# Patient Record
Sex: Female | Born: 1965
Health system: Southern US, Community
[De-identification: ages and names within clinical notes are randomized; demographics above are authoritative.]

## PROBLEM LIST (undated history)

## (undated) DIAGNOSIS — G2581 Restless legs syndrome: Secondary | ICD-10-CM

## (undated) DIAGNOSIS — E119 Type 2 diabetes mellitus without complications: Secondary | ICD-10-CM

## (undated) DIAGNOSIS — I10 Essential (primary) hypertension: Secondary | ICD-10-CM

## (undated) DIAGNOSIS — E785 Hyperlipidemia, unspecified: Secondary | ICD-10-CM

## (undated) DIAGNOSIS — G35D Multiple sclerosis, unspecified: Secondary | ICD-10-CM

## (undated) DIAGNOSIS — D649 Anemia, unspecified: Secondary | ICD-10-CM

## (undated) DIAGNOSIS — G35 Multiple sclerosis: Secondary | ICD-10-CM

## (undated) HISTORY — PX: BONE CYST EXCISION: SHX376

## (undated) HISTORY — PX: ESSURE TUBAL LIGATION: SUR464

## (undated) HISTORY — DX: Restless legs syndrome: G25.81

---
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
## 1999-03-11 ENCOUNTER — Other Ambulatory Visit: Admission: RE | Admit: 1999-03-11 | Discharge: 1999-03-11 | Payer: Self-pay | Admitting: *Deleted

## 2000-08-05 ENCOUNTER — Other Ambulatory Visit: Admission: RE | Admit: 2000-08-05 | Discharge: 2000-08-05 | Payer: Self-pay | Admitting: *Deleted

## 2001-09-03 ENCOUNTER — Emergency Department (HOSPITAL_COMMUNITY): Admission: EM | Admit: 2001-09-03 | Discharge: 2001-09-03 | Payer: Self-pay | Admitting: Emergency Medicine

## 2001-09-03 ENCOUNTER — Encounter: Payer: Self-pay | Admitting: Emergency Medicine

## 2001-09-05 ENCOUNTER — Other Ambulatory Visit: Admission: RE | Admit: 2001-09-05 | Discharge: 2001-09-05 | Payer: Self-pay | Admitting: *Deleted

## 2002-09-11 ENCOUNTER — Other Ambulatory Visit: Admission: RE | Admit: 2002-09-11 | Discharge: 2002-09-11 | Payer: Self-pay | Admitting: *Deleted

## 2003-11-06 ENCOUNTER — Other Ambulatory Visit: Admission: RE | Admit: 2003-11-06 | Discharge: 2003-11-06 | Payer: Self-pay | Admitting: *Deleted

## 2004-11-17 ENCOUNTER — Other Ambulatory Visit: Admission: RE | Admit: 2004-11-17 | Discharge: 2004-11-17 | Payer: Self-pay | Admitting: Obstetrics and Gynecology

## 2005-03-10 ENCOUNTER — Ambulatory Visit (HOSPITAL_COMMUNITY): Admission: RE | Admit: 2005-03-10 | Discharge: 2005-03-10 | Payer: Self-pay | Admitting: Internal Medicine

## 2005-03-16 ENCOUNTER — Encounter: Admission: RE | Admit: 2005-03-16 | Discharge: 2005-03-16 | Payer: Self-pay | Admitting: Internal Medicine

## 2005-03-23 ENCOUNTER — Ambulatory Visit (HOSPITAL_COMMUNITY): Admission: RE | Admit: 2005-03-23 | Discharge: 2005-03-24 | Payer: Self-pay | Admitting: Neurology

## 2005-03-23 ENCOUNTER — Encounter (INDEPENDENT_AMBULATORY_CARE_PROVIDER_SITE_OTHER): Payer: Self-pay | Admitting: *Deleted

## 2006-04-01 ENCOUNTER — Other Ambulatory Visit: Admission: RE | Admit: 2006-04-01 | Discharge: 2006-04-01 | Payer: Self-pay | Admitting: Obstetrics and Gynecology

## 2006-06-26 ENCOUNTER — Emergency Department (HOSPITAL_COMMUNITY): Admission: EM | Admit: 2006-06-26 | Discharge: 2006-06-27 | Payer: Self-pay | Admitting: Emergency Medicine

## 2008-10-15 DIAGNOSIS — E119 Type 2 diabetes mellitus without complications: Secondary | ICD-10-CM | POA: Insufficient documentation

## 2009-07-31 ENCOUNTER — Ambulatory Visit (HOSPITAL_COMMUNITY): Admission: RE | Admit: 2009-07-31 | Discharge: 2009-07-31 | Payer: Self-pay | Admitting: Obstetrics and Gynecology

## 2011-04-24 NOTE — Op Note (Signed)
NAMEKALLAN, BISCHOFF              ACCOUNT NO.:  [id number]   MEDICAL RECORD NO.:  [id number]          PATIENT TYPE:  OUT   LOCATION:  [facility]                          FACILITY:  [facility]   PHYSICIAN:  Melvyn Novas, M.D.  DATE OF BIRTH:  [birth year]   DATE OF PROCEDURE:  DATE OF DISCHARGE:                                 OPERATIVE REPORT   SPINAL FLUID EVALUATION/LUMBAR PUNCTURE:  Mrs. Fargo is a 45 year old Caucasian right-handed female patient of Dr.  Kirby Funk who recently developed sudden onset of hemilateral numbness  from the thoracic T6, T7 level downwards involving half of the torso and  lower extremity.  An MRI was already obtained through Dr. Valentina Lucks and showed  a likely demyelination lesion in the spinal cord.  The CSF was obtained  today in an uncomplicated procedure.  The patient was placed at the side of  the bed, bending forwards.  The area was sterilely prepped and covered.  A  regular spinal tap needle 13 gauge was inserted without any problem after  lidocaine anesthesia.  The patient tolerated the procedure well and was very  cooperative.  Four vial of 2.5 mL each were obtained from the spinal tap.  The needle was then retracted without complications and without significant  bleeding.  The patient was advised to lie down for 90 minutes in horizontal  position and to drink fluids.  She received a Percocet p.r.n. prescription  in case she developed spinal headache.  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  Laboratory results please forward to Dr.  Kirby Funk and to [facility] automatically upon  return.      CD/MEDQ  D:  03/23/2005  T:  03/23/2005  Job:  [id number]   cc:   Thora Lance, M.D.  [address]  [place]  [place] [address]  Fax: [phone]

## 2011-10-16 ENCOUNTER — Ambulatory Visit (INDEPENDENT_AMBULATORY_CARE_PROVIDER_SITE_OTHER): Payer: Self-pay | Admitting: Pharmacist

## 2011-10-16 ENCOUNTER — Encounter: Payer: Self-pay | Admitting: Pharmacist

## 2011-10-16 DIAGNOSIS — R519 Headache, unspecified: Secondary | ICD-10-CM | POA: Insufficient documentation

## 2011-10-16 DIAGNOSIS — G35 Multiple sclerosis: Secondary | ICD-10-CM

## 2011-10-16 DIAGNOSIS — I10 Essential (primary) hypertension: Secondary | ICD-10-CM

## 2011-10-16 DIAGNOSIS — E7800 Pure hypercholesterolemia, unspecified: Secondary | ICD-10-CM

## 2011-10-16 DIAGNOSIS — E119 Type 2 diabetes mellitus without complications: Secondary | ICD-10-CM

## 2011-10-16 DIAGNOSIS — R51 Headache: Secondary | ICD-10-CM

## 2011-10-16 NOTE — Assessment & Plan Note (Signed)
No complications of current therapy with no plans to change current therapy.

## 2011-10-16 NOTE — Progress Notes (Signed)
  Subjective:    Patient ID: Erica Powell, female    DOB: 1966-10-02, 45 y.o.   MRN: [id number]  HPI Pt arrived in good spirits for a medication review with a medication list. Pt has a diagnosis for multiple sclerosis for which she takes Betaseron. Pt states that she recently started a multivitamin with iron for low iron counts.    Review of Systems     Objective:   Physical Exam BP 137/94  Pulse 81  Ht 5' 4.7" (1.643 m)  Wt 209 lb 3.2 oz (94.892 kg)  BMI 35.14 kg/m2        Assessment & Plan:  No complications of current therapy with no plans to change current therapy.   Patient seen with Marijo Conception, PharmD Candidate and Maudry Mayhew, Pharmacy Resident.

## 2011-10-16 NOTE — Progress Notes (Signed)
  Subjective:    Patient ID: Erica Powell, female    DOB: 09-Aug-1966, 45 y.o.   MRN: [id number]  HPI  Reviewed and agree with Dr. Macky Lower management.  Review of Systems     Objective:   Physical Exam        Assessment & Plan:

## 2011-11-15 ENCOUNTER — Emergency Department
Admission: EM | Admit: 2011-11-15 | Discharge: 2011-11-15 | Disposition: A | Discharge status: Home / Self Care | Payer: 59 | Source: Home / Self Care | Attending: Family Medicine | Admitting: Family Medicine

## 2011-11-15 ENCOUNTER — Encounter: Payer: Self-pay | Admitting: Emergency Medicine

## 2011-11-15 DIAGNOSIS — R112 Nausea with vomiting, unspecified: Secondary | ICD-10-CM

## 2011-11-15 DIAGNOSIS — H109 Unspecified conjunctivitis: Secondary | ICD-10-CM

## 2011-11-15 HISTORY — DX: Essential (primary) hypertension: I10

## 2011-11-15 HISTORY — DX: Hyperlipidemia, unspecified: E78.5

## 2011-11-15 HISTORY — DX: Multiple sclerosis, unspecified: G35.D

## 2011-11-15 HISTORY — DX: Multiple sclerosis: G35

## 2011-11-15 HISTORY — DX: Type 2 diabetes mellitus without complications: E11.9

## 2011-11-15 MED ORDER — TOBRAMYCIN-DEXAMETHASONE 0.3-0.1 % OP SUSP: OPHTHALMIC | Status: AC

## 2011-11-15 NOTE — ED Notes (Signed)
Left eye conjunctivitis; no known injury.

## 2011-11-16 NOTE — ED Provider Notes (Addendum)
History     CSN: [id number] Arrival date & time: 11/15/2011  3:04 PM   First MD Initiated Contact with Patient 11/15/11 1545      Chief Complaint  Patient presents with  . Conjunctivitis      HPI Comments: Patient complains of onset of left eye itching and redness yesterday.  She denies foreign body sensation.  She wears contacts, and has not been wearing them since onset of symptoms.  Yesterday she also awoke with nausea/vomiting and diarrhea that has now ceased.  She now feels well otherwise.  Patient is a 45 y.o. female presenting with conjunctivitis. The history is provided by the patient.  Conjunctivitis  The current episode started yesterday. The problem occurs frequently. The problem has been unchanged. The problem is mild. The symptoms are relieved by nothing. The symptoms are aggravated by nothing. Associated symptoms include eye itching, eye discharge and eye redness. Pertinent negatives include no fever, no decreased vision, no double vision, no photophobia, no congestion, no ear discharge, no ear pain, no headaches, no rhinorrhea, no sore throat and no eye pain.    Past Medical History  Diagnosis Date  . Multiple sclerosis   . Diabetes mellitus   . Type 2 diabetes mellitus without (mention of) complications   . Hyperlipemia   . Hypertension     Past Surgical History  Procedure Date  . Essure tubal ligation   . Bone cyst excision     No family history on file.  History  Substance Use Topics  . Smoking status: Never Smoker   . Smokeless tobacco: Never Used  . Alcohol Use: No    OB History    Grav Para Term Preterm Abortions TAB SAB Ect Mult Living                  Review of Systems  Constitutional: Negative for fever.  HENT: Negative for ear pain, congestion, sore throat, rhinorrhea and ear discharge.   Eyes: Positive for discharge, redness and itching. Negative for double vision, photophobia and pain.  Neurological: Negative for headaches.  All other  systems reviewed and are negative.    Allergies  Cephalosporins; Hydrochlorothiazide; Penicillins; Sulfa antibiotics; and Amoxicillin  Home Medications   Current Outpatient Rx  Name Route Sig Dispense Refill  . ACETAMINOPHEN 500 MG PO TABS Oral Take 2 tablets (1,000 mg total) by mouth every other day. Prior to Betaseron injection.    . ASPIRIN 81 MG PO TABS Oral Take 1 tablet (81 mg total) by mouth daily.    . ASPIRIN-ACETAMINOPHEN-CAFFEINE 250-250-65 MG PO TABS Oral Take 2 tablets by mouth as needed (Headache).    . ATORVASTATIN CALCIUM 10 MG PO TABS Oral Take 1 tablet (10 mg total) by mouth daily.    [name] CLONAZEPAM 0.5 MG PO TABS Oral Take 0.5 tablets (0.25 mg total) by mouth 2 (two) times daily as needed. For insomnia and myoclonus jerks.    . INTERFERON BETA-1B 0.3 MG [place] SOLR Subcutaneous Inject 0.3 mg into the skin every other day.    [name] LISINOPRIL 10 MG PO TABS Oral Take 1 tablet (10 mg total) by mouth daily.    [name] METFORMIN HCL 1000 MG PO TABS Oral Take 1 tablet (1,000 mg total) by mouth 2 (two) times daily with a meal.    . MULTIVITAMIN/IRON PO TABS Oral Take 1 tablet by mouth daily.  0  . SERTRALINE HCL 50 MG PO TABS Oral Take 1 tablet (50 mg total) by mouth daily.    [name]  TOBRAMYCIN-DEXAMETHASONE 0.3-0.1 % OP SUSP  One or two drops in left eye every 4hours while awake. 5 mL 0    Pulse 78  Temp(Src) 98.7 F (37.1 C) (Oral)  Resp 16  Ht 5' 5.5" (1.664 m)  Wt 204 lb (92.534 kg)  BMI 33.43 kg/m2  SpO2 98%  LMP 10/27/2011  Physical Exam  Nursing note and vitals reviewed. Constitutional: She is oriented to person, place, and time. She appears well-developed and well-nourished. No distress.  HENT:  Head: Normocephalic.  Right Ear: External ear normal.  Left Ear: External ear normal.  Nose: Nose normal.  Mouth/Throat: Oropharynx is clear and moist.  Eyes: EOM and lids are normal. Pupils are equal, round, and reactive to light. No foreign bodies found. Right eye exhibits no  discharge. Left eye exhibits no discharge, no exudate and no hordeolum. No foreign body present in the left eye. Right conjunctiva is not injected. Left conjunctiva is injected. Left conjunctiva has no hemorrhage.       Left conjuctivae mildly injected.  Lid eversion reveals no foreign body.  Fluorescein reveals no uptake.  Fundi appear benign.  No photophobia.  Neck: Neck supple.  Cardiovascular: Normal heart sounds.   Pulmonary/Chest: Effort normal and breath sounds normal. No respiratory distress.  Abdominal: Soft. There is no tenderness.  Musculoskeletal: She exhibits no edema.  Lymphadenopathy:    She has no cervical adenopathy.  Neurological: She is alert and oriented to person, place, and time.  Skin: Skin is warm and dry. No rash noted.    ED Course  Procedures none      1. Conjunctivitis of left eye       MDM  Patient may have early viral syndrome because of onset of nausea/vomiting yesterday. However, will treat as a bacterial conjunctivitis:  Begin Tobradex ophthalmic suspension q4hr. Followup with ophthalmologist if not improving 3 to 4 days.        Donna Christen, MD 11/17/11 1478  Donna Christen, MD 11/17/11 [phone]

## 2012-10-03 ENCOUNTER — Ambulatory Visit (INDEPENDENT_AMBULATORY_CARE_PROVIDER_SITE_OTHER): Payer: Self-pay | Admitting: Pharmacist

## 2012-10-03 ENCOUNTER — Encounter: Payer: Self-pay | Admitting: Pharmacist

## 2012-10-03 VITALS — BP 165/89 | HR 69 | Ht 66.0 in | Wt 212.8 lb

## 2012-10-03 DIAGNOSIS — G35 Multiple sclerosis: Secondary | ICD-10-CM

## 2012-10-03 DIAGNOSIS — G35D Multiple sclerosis, unspecified: Secondary | ICD-10-CM

## 2012-10-03 NOTE — Progress Notes (Signed)
  Subjective:    Patient ID: Erica Powell, female    DOB: [birth year], 46 y.o.   MRN: [id number]  HPI  Patient arrives in good spirits for medication review.   Reports seeing Dr. Kirby Funk - Deboraha Sprang at [facility] as primary care provider, Dr. Emilia Beck  as neurologist, and Dr. Diana Eves for Opthamology.  Reports being diagnosed with MS since 2006 and states this is currently under an acceptable level of control.     Review of Systems     Objective:   Physical Exam        Assessment & Plan:  Following medication review, no suggestions for change.  Complete medication list provided to patient.  Total time in face to face medication review: 15 minutes.  Patient seen with: Tiney Rouge, PharmD Candidate.

## 2012-10-03 NOTE — Progress Notes (Signed)
Patient ID: Erica Powell, female   DOB: 1966/01/10, 46 y.o.   MRN: [id number] Reviewed and agree with Dr. Macky Lower documentation and management.

## 2012-10-03 NOTE — Patient Instructions (Addendum)
Thank you for coming in today for your medication review. I am happy to hear that you are doing well and please give a me call if you have any questions about your medications. Have a great day.

## 2012-10-03 NOTE — Assessment & Plan Note (Signed)
Following medication review, no suggestions for change.  Complete medication list provided to patient.  Total time in face to face medication review: 15 minutes.  Patient seen with: Tiney Rouge, PharmD Candidate.

## 2013-07-05 ENCOUNTER — Other Ambulatory Visit: Payer: Self-pay | Admitting: Neurology

## 2013-07-06 ENCOUNTER — Other Ambulatory Visit: Payer: Self-pay

## 2013-07-06 DIAGNOSIS — G35 Multiple sclerosis: Secondary | ICD-10-CM

## 2013-07-06 MED ORDER — CLONAZEPAM 0.5 MG PO TABS: 0.2500 mg | ORAL_TABLET | Freq: Two times a day (BID) | ORAL | Status: DC | PRN

## 2013-09-05 ENCOUNTER — Telehealth: Payer: Self-pay | Admitting: Neurology

## 2013-09-05 NOTE — Telephone Encounter (Signed)
Called patient and asked her to hydrate and take electrolytes, Mg , ca  K.  If no change by tomorrow will need a  Visit with PCP - .[name] The tingling involves face and all extremities , lips,   Body-front and back- unlikely a CNS manifestation form MS .

## 2013-09-05 NOTE — Telephone Encounter (Signed)
Spoke to patient and she has been having prickly and tingling feeling on different parts of her body for over a week.  These feeling come and go, she doesn't think its her MS.   Wants to know if she should come in or if there is something she can take.  [phone]  Please advise.

## 2013-10-16 ENCOUNTER — Other Ambulatory Visit: Payer: Self-pay | Admitting: Neurology

## 2013-10-16 ENCOUNTER — Telehealth: Payer: Self-pay | Admitting: Neurology

## 2013-10-16 DIAGNOSIS — G35 Multiple sclerosis: Secondary | ICD-10-CM

## 2013-10-16 MED ORDER — INTERFERON BETA-1B 0.3 MG ~~LOC~~ KIT: 0.3000 mg | PACK | SUBCUTANEOUS | Status: DC

## 2013-11-07 ENCOUNTER — Encounter: Payer: Self-pay | Admitting: Neurology

## 2013-11-07 ENCOUNTER — Encounter (INDEPENDENT_AMBULATORY_CARE_PROVIDER_SITE_OTHER): Payer: Self-pay

## 2013-11-07 ENCOUNTER — Ambulatory Visit (INDEPENDENT_AMBULATORY_CARE_PROVIDER_SITE_OTHER): Payer: 59 | Admitting: Neurology

## 2013-11-07 VITALS — BP 134/75 | HR 70 | Resp 16 | Ht 65.0 in | Wt 196.7 lb

## 2013-11-07 DIAGNOSIS — F329 Major depressive disorder, single episode, unspecified: Secondary | ICD-10-CM

## 2013-11-07 DIAGNOSIS — F32A Depression, unspecified: Secondary | ICD-10-CM

## 2013-11-07 DIAGNOSIS — G35 Multiple sclerosis: Secondary | ICD-10-CM

## 2013-11-07 DIAGNOSIS — F3289 Other specified depressive episodes: Secondary | ICD-10-CM

## 2013-11-07 DIAGNOSIS — E0840 Diabetes mellitus due to underlying condition with diabetic neuropathy, unspecified: Secondary | ICD-10-CM | POA: Insufficient documentation

## 2013-11-07 DIAGNOSIS — E119 Type 2 diabetes mellitus without complications: Secondary | ICD-10-CM

## 2013-11-07 MED ORDER — INTERFERON BETA-1B 0.3 MG ~~LOC~~ KIT: 0.3000 mg | PACK | SUBCUTANEOUS | Status: DC

## 2013-11-07 MED ORDER — SERTRALINE HCL 50 MG PO TABS: 50.0000 mg | ORAL_TABLET | Freq: Every day | ORAL | Status: DC

## 2013-11-07 MED ORDER — CLONAZEPAM 0.5 MG PO TABS: 0.2500 mg | ORAL_TABLET | Freq: Two times a day (BID) | ORAL | Status: DC | PRN

## 2013-11-07 NOTE — Progress Notes (Addendum)
[facility]  Provider:  Melvyn Novas, M D  Referring Provider: No ref. provider found Primary Care Physician:  Dr. Valentina Lucks   Chief Complaint  Patient presents with  . Multiple Sclerosis    HPI:  Erica Powell is a 47 y.o. left handed, caucasian  female  Is seen here as a routine once a year revisit for multiple sclerosis, originally referred  from Dr. Valentina Lucks.   Miss Erica Powell has been a patient in our neurology clinic for the last 8 years , beginning in 2006.  She was diagnosed with multiple sclerosis and treatment was initiated. She had no recent MS relapses but a brain MRI that had been performed in January 2014 was showing still scattered periventricular, subcortical and pontine T2 hyperintensities consistent with chronic demyelinating plaques of various age.  No acute lesions were seen in her last MRI obtained on 12/29/2012. She had 2 separate studies in 2006 at [facility].   Initally she had C5 and C 6 lesions, , these dorsal cervical cord lesions did not span multiple levels. The patient was diagnosed in 2012 with  diabetes and has a strong DM family history.  Her last HbA1c just  2 month ago was 7.0 and had been in June 2014 at 8.3. She noticed an improvement in her urinary frequency once her sugars were lower. She also has still no clinical progression or relapse, but she reports problems with concentration, cognition  and  sometimes with multitasking. She feels that she is easily distracted and loses her train of thought when this happens. She feels especially distracted by noises at her work. 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 There's been no change in her employment, hours of work , hobbies or social life. She has a bachelor's degree ,is single but lives with her fianc.  She recently noted a new symptom of  tightness in her chest , a paraspinal tenderness and tightness, a confined feeling surrounding the chest , a  possible variation of " the MS hug", she has responded well to massage, but the relief is not sustained.   Review of Systems: Out of a complete 14 system review, the patient complains of only the following symptoms, and all other reviewed systems are negative.  In today's yearly visit the patient endorsed the following symptoms, blurred vision either as a fatigability of vision acuity and likely related to higher sugar blood sugar.  Feeling hot, joint pain,  aching muscles. When at rest ,her right leg may produce myoclonic movements or spasms.  She has massaged her leg to be able to feel better, or has to move - This sounds like restless legs. She is tired, sleepier than she used to be. She still has shift work. Endorsed were : distract ability, hearing impairment or audio processing impairment , confusion ,back pain,weakness and some tremors.  She continues to drink [facility] up to 3 times (20 ounces)  a week.  She feels, she needs the caffeine to stay awake at night.    She had no recent hospitalizations and no changes in her immediate family history.  History   Social History  . Marital Status: Single    Spouse Name: N/A    Number of Children: N/A  . Years of Education: N/A   Occupational History  . Not on file.   Social History Main Topics  . Smoking status: Never Smoker   . Smokeless tobacco: Never Used  . Alcohol Use:  No  . Drug Use: Not on file  . Sexual Activity: Not on file   Other Topics Concern  . Not on file   Social History Narrative  . No narrative on file    Family History  Problem Relation Age of Onset  . Stroke Mother     Past Medical History  Diagnosis Date  . Multiple sclerosis   . Diabetes mellitus   . Type 2 diabetes mellitus without (mention of) complications   . Hyperlipemia   . Hypertension     Past Surgical History  Procedure Laterality Date  . Essure tubal ligation    . Bone cyst excision      Current Outpatient Prescriptions   Medication Sig Dispense Refill  . acetaminophen (TYLENOL) 500 MG tablet Take 2 tablets (1,000 mg total) by mouth every other day. Prior to Betaseron injection.      [name] aspirin 81 MG tablet Take 1 tablet (81 mg total) by mouth daily.      [name] aspirin-acetaminophen-caffeine (EXCEDRIN MIGRAINE) 250-250-65 MG per tablet Take 2 tablets by mouth as needed (Headache).      [name] atorvastatin (LIPITOR) 10 MG tablet Take 20 mg by mouth daily.       . cholecalciferol (VITAMIN D) 1000 UNITS tablet Take 1,000 Units by mouth daily.      . clonazePAM (KLONOPIN) 0.5 MG tablet Take 0.5 tablets (0.25 mg total) by mouth 2 (two) times daily as needed. For insomnia and myoclonus jerks.  30 tablet  5  . Interferon Beta-1b (BETASERON/EXTAVIA) 0.3 MG KIT injection Inject 0.3 mg into the skin every other day.  3 each  0  . lisinopril (PRINIVIL,ZESTRIL) 10 MG tablet Take 1 tablet (10 mg total) by mouth daily.      . metFORMIN (GLUCOPHAGE) 1000 MG tablet Take 1 tablet (1,000 mg total) by mouth 2 (two) times daily with a meal.      . Multiple Vitamins-Iron (MULTIVITAMIN/IRON) TABS Take 1 tablet by mouth daily.    0  . sertraline (ZOLOFT) 50 MG tablet Take 1 tablet (50 mg total) by mouth daily.      . vitamin B-12 (CYANOCOBALAMIN) 1000 MCG tablet Take 1,000 mcg by mouth daily.       No current facility-administered medications for this visit.    Allergies as of 11/07/2013 - Review Complete 11/07/2013  Allergen Reaction Noted  . Cephalosporins Itching and Swelling 10/16/2011  . Hydrochlorothiazide Itching and Swelling 10/16/2011  . Penicillins Itching and Swelling 10/16/2011  . Sulfa antibiotics Itching and Swelling 10/16/2011  . Baclofen Other (See Comments) 10/03/2012    Vitals: BP 134/75  Pulse 70  Resp 16  Ht 5\' 5"  (1.651 m)  Wt 196 lb 11.2 oz (89.223 kg)  BMI 32.73 kg/m2 Last Weight:  Wt Readings from Last 1 Encounters:  11/07/13 196 lb 11.2 oz (89.223 kg)   Last Height:   Ht Readings from Last 1  Encounters:  11/07/13 5\' 5"  (1.651 m)    Physical exam:  General: The patient is awake, alert and appears not in acute distress. The patient is well groomed. Head: Normocephalic, atraumatic. Neck is supple. Mallampati 1, neck circumference 14.5 , no nasal septal deviation, no retrognathia  Cardiovascular:  Regular rate and rhythm , without  murmurs or carotid bruit, and without distended neck veins. Respiratory: Lungs are clear to auscultation. Skin:  Without evidence of edema, or rash Trunk: BMI is elevated - patient  has normal posture.  Neurologic exam : The patient is awake  and alert, pleasant and conversational, fully oriented to place and time.   Memory subjective described as intact, but attention span & concentration ability were reportedly impaired. This is not evident here in the office, normal conversation. Speech is fluent without  dysarthria, dysphonia or aphasia.  Mood and affect are appropriate.  Cranial nerves: Pupils are equal and briskly reactive to light. Funduscopic exam without evidence of pallor or edema. Extraocular movements  in vertical and horizontal planes intact and without nystagmus.  Visual fields by finger perimetry are intact. Hearing to finger rub intact.   Facial sensation intact to fine touch. Facial motor strength is symmetric and tongue and uvula move midline.  Motor exam:   Normal tone and normal muscle bulk and symmetric normal strength in all extremities. Good bilateral grip and pinch strength.   Sensory:  Fine touch, pinprick and vibration were tested in all extremities. The patient is much less ticklish than she used to be, her fine filament sense appears decreased.  Proprioception is tested in the upper extremities only. This was  normal.  Coordination: Rapid alternating movements in the fingers/hands is tested and normal. Finger-to-nose maneuver tested and normal without evidence of ataxia, dysmetria or tremor.  Gait and station: Patient walks  without assistive device . Strength within normal limits. Stance is stable and normal.  Tandem gait is unfragmented. Romberg testing is normal.  Deep tendon reflexes: in the  upper and lower extremities are symmetric, and very brisk  - no clonus .  Babinski  downgoing.   Assessment:  After physical and neurologic examination, review of laboratory studies, imaging, neurophysiology testing and pre-existing records, assessment is  1) Multiple sclerosis - no relapse clinically , but there is a a progression in the brain MRI over 7 years - we compared brain MRI studies from 2008 and 2014. The description of Lhermitte sign and "MS hug" make a cord image study necessary.  2) Restless legs. 3)  DM type 2 - diabetic induced nocturia, vision blurring , and likely a beginning neuropathy. 4) obesity.   Plan:  Treatment plan and additional workup : Obtain cervical spine MRI and thoracic MRI with and without contrast.  Continue Betaseron until any changes occur.  RLS- encouraged magnesium and K intake, gatorade has helped in the past.

## 2013-11-07 NOTE — Patient Instructions (Signed)
Multiple Sclerosis Multiple sclerosis (MS) is a disease of the central nervous system. Its cause is unknown. It is more common in the northern states than in the southern states. There is a higher incidence of MS in women. There is a wide variation in the symptoms (problems) of MS. This is because of the many different ways it affects the central nervous system. It often comes on in episodes or attacks. These attacks may last weeks to months. There may be long periods of nearly no problems between attacks. The main symptoms include visual problems (associated with eye pain), numbness, weakness, and paralysis in extremities (arms/hands and legs/feet). There may also be tremors and problems with balance and walking. The age when MS starts is variable. Advances in medicine continue to improve the treatment of this illness. There is no known cure for MS but there are medications that help. MS is not an inherited illness, although your risk of getting this disease is higher if you have a relative with MS. The best radiologic (x-ray) study for MS is an MRI (magnetic resonance imaging). There are medications available to decrease the number and frequency of attacks. SYMPTOMS  The symptoms of MS are caused by loss of insulation (myelin) of the nerves of the brain. When this happens, brain signals do not get transmitted properly or may not get transmitted at all. Some of the problems caused by this include:   Numbness.  Weakness.  Paralysis in extremities.  Visual problems, eye pain.  Balance problems.  Tremors. DIAGNOSIS  Your caregiver can do studies on you to make this diagnosis. This may include specialized X-rays and spinal fluid studies. HOME CARE INSTRUCTIONS   Take medications as directed by your caregiver. Baclofen is a drug commonly used to reduce muscle spasticity. Steroids are often used for short term relief.  Exercise as directed.  Use physical and occupational therapy as directed by  your caregiver. Careful attention to this medical care can help avoid depression.  See your caregiver if you begin to have problems with depression. This is a common problem in MS. Patients often continue to work many years after the diagnosis of MS. Document Released: 11/20/2000 Document Revised: 02/15/2012 Document Reviewed: 06/29/2007 [employer] Patient Information 2014 [employer].  

## 2013-11-07 NOTE — Addendum Note (Signed)
Addended by: Melvyn Novas on: 11/07/2013 02:11 PM   Modules accepted: Orders

## 2013-11-08 LAB — COMPREHENSIVE METABOLIC PANEL
ALT: 16 IU/L (ref 0–32)
Albumin/Globulin Ratio: 1.4 (ref 1.1–2.5)
Albumin: 4.2 g/dL (ref 3.5–5.5)
Alkaline Phosphatase: 75 IU/L (ref 39–117)
BUN/Creatinine Ratio: 29 — ABNORMAL HIGH (ref 9–23)
Chloride: 101 mmol/L (ref 96–108)
Creatinine, Ser: 0.41 mg/dL — ABNORMAL LOW (ref 0.57–1.00)
GFR calc Af Amer: 143 mL/min/{1.73_m2} (ref >59)
GFR calc non Af Amer: 124 mL/min/{1.73_m2} (ref >59)
Globulin, Total: 2.9 g/dL (ref 1.5–4.5)
Glucose: 152 mg/dL — ABNORMAL HIGH (ref 65–99)
Potassium: 3.8 mmol/L (ref 3.5–5.2)
Total Bilirubin: 0.1 mg/dL (ref 0.0–1.2)
Total Protein: 7.1 g/dL (ref 6.0–8.5)

## 2013-12-13 ENCOUNTER — Encounter: Payer: Self-pay | Admitting: Neurology

## 2013-12-13 ENCOUNTER — Other Ambulatory Visit: Payer: 59

## 2013-12-13 NOTE — Telephone Encounter (Signed)
Cervical and thoracic spine.

## 2013-12-19 ENCOUNTER — Ambulatory Visit (HOSPITAL_COMMUNITY)
Admission: RE | Admit: 2013-12-19 | Discharge: 2013-12-19 | Disposition: A | Discharge status: Home / Self Care | Payer: 59 | Source: Ambulatory Visit | Attending: Neurology | Admitting: Neurology

## 2013-12-19 DIAGNOSIS — F32A Depression, unspecified: Secondary | ICD-10-CM

## 2013-12-19 DIAGNOSIS — F329 Major depressive disorder, single episode, unspecified: Secondary | ICD-10-CM

## 2013-12-19 DIAGNOSIS — E236 Other disorders of pituitary gland: Secondary | ICD-10-CM | POA: Insufficient documentation

## 2013-12-19 DIAGNOSIS — G35 Multiple sclerosis: Secondary | ICD-10-CM | POA: Insufficient documentation

## 2013-12-19 DIAGNOSIS — M5124 Other intervertebral disc displacement, thoracic region: Secondary | ICD-10-CM | POA: Insufficient documentation

## 2013-12-19 DIAGNOSIS — E119 Type 2 diabetes mellitus without complications: Secondary | ICD-10-CM

## 2013-12-19 MED ORDER — GADOBENATE DIMEGLUMINE 529 MG/ML IV SOLN
18.0000 mL | Freq: Once | INTRAVENOUS | Status: AC | PRN
  Administered 2013-12-19: 18 mL via INTRAVENOUS

## 2013-12-20 NOTE — Progress Notes (Signed)
Quick Note:  Erica Powell, call patient, MRI cervical showed improvement, regression of previous active MS lesions. ______

## 2013-12-20 NOTE — Progress Notes (Signed)
Quick Note:  Please call patient, there is no MS plaque in her MRI thoracic spine. Mild left paracentral disc protrusion at T8-9, no cord compression ______

## 2013-12-25 NOTE — Progress Notes (Signed)
Quick Note:  Left message with MRI cervical results, showing regression of previous active MS lesions, per Dr. Terrace Arabia. Told to call with any questions. ______

## 2013-12-25 NOTE — Progress Notes (Signed)
Quick Note:  Left message with MRI thoracic results, no MS plaque in thoracic spine, mild disc protrusion at T8-9, per Dr. Terrace Arabia. Told to call with any questions. ______

## 2013-12-27 ENCOUNTER — Encounter: Payer: Self-pay | Admitting: Neurology

## 2014-01-04 ENCOUNTER — Encounter: Payer: Self-pay | Admitting: Neurology

## 2014-01-11 DIAGNOSIS — Z0289 Encounter for other administrative examinations: Secondary | ICD-10-CM

## 2014-01-15 ENCOUNTER — Telehealth: Payer: Self-pay | Admitting: Neurology

## 2014-01-15 NOTE — Telephone Encounter (Signed)
I left a detailed message about lab services performed on 11-07-13.  If her insurance denied coverage we need to know what [facility] used as diagnosis and they need to send [place] the information, so we can put correct codes for it to be covered.  She needs to send [place] the [facility] bill and the denial.  I told her to call back if this has not been taken care of.

## 2014-01-22 NOTE — Telephone Encounter (Signed)
Spoke to patient and told her I would check with the women who does the buccal swab test, because that is the only other test besides CMP that was preformed 11-07-14.  Shanda Bumps the research person said [facility] is where they were sending those tests, she will check into why the patient was charged.

## 2014-02-16 ENCOUNTER — Encounter: Payer: Self-pay | Admitting: Neurology

## 2014-03-15 MED ORDER — SITAGLIPTIN PHOSPHATE 100 MG PO TABS: 100.0000 mg | ORAL_TABLET | Freq: Every day | ORAL | Status: DC

## 2014-06-06 ENCOUNTER — Encounter: Payer: Self-pay | Admitting: Nurse Practitioner

## 2014-06-13 ENCOUNTER — Encounter: Payer: Self-pay | Admitting: Nurse Practitioner

## 2014-06-13 ENCOUNTER — Ambulatory Visit (INDEPENDENT_AMBULATORY_CARE_PROVIDER_SITE_OTHER): Payer: 59 | Admitting: Nurse Practitioner

## 2014-06-13 VITALS — BP 133/89 | HR 77 | Ht 65.0 in | Wt 204.6 lb

## 2014-06-13 DIAGNOSIS — G35 Multiple sclerosis: Secondary | ICD-10-CM

## 2014-06-13 DIAGNOSIS — F32A Depression, unspecified: Secondary | ICD-10-CM

## 2014-06-13 DIAGNOSIS — E119 Type 2 diabetes mellitus without complications: Secondary | ICD-10-CM

## 2014-06-13 DIAGNOSIS — F329 Major depressive disorder, single episode, unspecified: Secondary | ICD-10-CM

## 2014-06-13 DIAGNOSIS — F3289 Other specified depressive episodes: Secondary | ICD-10-CM

## 2014-06-13 MED ORDER — TRAMADOL-ACETAMINOPHEN 37.5-325 MG PO TABS: 1.0000 | ORAL_TABLET | Freq: Four times a day (QID) | ORAL | Status: DC | PRN

## 2014-06-13 NOTE — Patient Instructions (Signed)
Continue Betaseron injections for MS.  Start Ultracet, 1-2 tablets as needed for back pain.  Do not take additional Tylenol when taking this medication.  RLS- encouraged magnesium and K intake, gatorade has helped in the past.   Follow up in 6 months, sooner as needed.

## 2014-06-13 NOTE — Progress Notes (Signed)
PATIENT: Erica Powell DOB: 1966-02-16  REASON FOR VISIT: routine follow up for MS HISTORY FROM: patient  HISTORY OF PRESENT ILLNESS: Erica Powell is a 48 y.o. left handed, caucasian female Is seen here as a routine once a year revisit for multiple sclerosis, originally referred from Dr. Laurann Montana.   UPDATE 06/13/14 (LL): Since last visit, she has had no issues with her MS.  No injection site reactions.  Through her therapist it was suggested that she may have paradoxical response to benzos, specifically the Klonopin she was taking, making her irritable, with mood swings, and aggressive. Her job performance was criticized for rudeness.  Upon stopping the Klonopin, her moods leveled out and she has returned to "her normal self."  She continues to have back pain and muscle cramps, which are intermittent.  She has been intolerant to Baclofen, making her heart race and feel jittery; Flexeril and Tizanidine were tried and made her too tired.  She has not tried Mirapex for fear of side effects. She has lost weight, 7 lbs since starting Januvia for DMII, without any change in her routine.  She has no new complaints today.  Erica Powell has been a patient in our neurology clinic for the last 8 years, beginning in 2006.  She was diagnosed with multiple sclerosis and treatment was initiated. She had no recent MS relapses but a brain MRI that had been performed in January 2014 was showing still scattered periventricular, subcortical and pontine T2 hyperintensities consistent with chronic demyelinating plaques of various age.  No acute lesions were seen in her last MRI obtained on 12/29/2012. She had 2 separate studies in 2006 at [place].  Initally she had C5 and C 6 lesions, these dorsal cervical cord lesions did not span multiple levels. The patient was diagnosed in 2012 with diabetes and has a strong DM family history.  Her last HbA1c just 2 month ago was 7.0 and had been in June 2014 at 8.3.  She noticed  an improvement in her urinary frequency once her sugars were lower. She also has still no clinical progression or relapse, but she reports problems with concentration, cognition and sometimes with multitasking. She feels that she is easily distracted and loses her train of thought when this happens. She feels especially distracted by noises at her work. 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 There's been no change in her employment, hours of work , hobbies or social life. She has a bachelor's degree ,is single but lives with her fianc.  She recently noted a new symptom of tightness in her chest , a paraspinal tenderness and tightness, a confined feeling surrounding the chest , a possible variation of " the MS hug", she has responded well to massage, but the relief is not sustained.   Review of Systems:  Out of a complete 14 system review, the patient complains of only the following symptoms, and all other reviewed systems are negative.  In today's yearly visit the patient endorsed the following symptoms, blurred vision either as a fatigability of vision acuity and likely related to higher sugar blood sugar. Feeling hot, joint pain, aching muscles. When at rest her right leg may produce myoclonic movements or spasms. She has massaged her leg to be able to feel better, or has to move - This sounds like restless legs. She is tired, sleepier than she used to be. She still has shift work.  Endorsed were: distractability, hearing impairment or audio  processing impairment, confusion, back pain,weakness and some tremors.   ALLERGIES: Allergies  Allergen Reactions  . Cephalosporins Itching and Swelling    Occurs with cephalexin (Keflex). Starts with prickling around the face, swelling/numb feeling of face.  . Hydrochlorothiazide Itching and Swelling    Swelling of the face, hives, itching, feeling of bad sunburn.   . Penicillins Itching and Swelling    Occurs with  Augmentin and amoxicillin. Hives, prickling, swelling of face.   . Sulfa Antibiotics Itching and Swelling    Itching, hives, swelling of face  . Baclofen Other (See Comments)    Leg spasms, jittery, heart racing     HOME MEDICATIONS: Outpatient Prescriptions Prior to Visit  Medication Sig Dispense Refill  . acetaminophen (TYLENOL) 500 MG tablet Take 2 tablets (1,000 mg total) by mouth every other day. Prior to Betaseron injection.      [name] aspirin 81 MG tablet Take 1 tablet (81 mg total) by mouth daily.      [name] aspirin-acetaminophen-caffeine (EXCEDRIN MIGRAINE) 250-250-65 MG per tablet Take 2 tablets by mouth as needed (Headache).      [name] atorvastatin (LIPITOR) 10 MG tablet Take 20 mg by mouth daily.       . cholecalciferol (VITAMIN D) 1000 UNITS tablet Take 1,000 Units by mouth daily.      . Interferon Beta-1b (BETASERON/EXTAVIA) 0.3 MG KIT injection Inject 0.3 mg into the skin every other day.  14 each  9  . lisinopril (PRINIVIL,ZESTRIL) 10 MG tablet Take 1 tablet (10 mg total) by mouth daily.      . metFORMIN (GLUCOPHAGE) 1000 MG tablet Take 1 tablet (1,000 mg total) by mouth 2 (two) times daily with a meal.      . Multiple Vitamins-Iron (MULTIVITAMIN/IRON) TABS Take 1 tablet by mouth daily.    0  . sertraline (ZOLOFT) 50 MG tablet Take 1 tablet (50 mg total) by mouth daily.      . sitaGLIPtin (JANUVIA) 100 MG tablet Take 1 tablet (100 mg total) by mouth daily.  30 tablet  0  . vitamin B-12 (CYANOCOBALAMIN) 1000 MCG tablet Take 1,000 mcg by mouth daily.      . clonazePAM (KLONOPIN) 0.5 MG tablet Take 0.5 tablets (0.25 mg total) by mouth 2 (two) times daily as needed. For insomnia and myoclonus jerks.  30 tablet  5  . sertraline (ZOLOFT) 50 MG tablet Take 1 tablet (50 mg total) by mouth daily.  90 tablet  3   No facility-administered medications prior to visit.    PHYSICAL EXAM Filed Vitals:   06/13/14 0840  BP: 133/89  Pulse: 77  Height: '5\' 5"'  (1.651 m)  Weight: 204 lb 9.6 oz (92.806  kg)   Body mass index is 34.05 kg/(m^2).  Visual Acuity Screening   Right eye Left eye Both eyes  Without correction:     With correction: '20/50 20/40 20/40 '   Physical exam:  General: The patient is awake, alert and appears not in acute distress. The patient is well groomed.  Head: Normocephalic, atraumatic. Neck is supple. Mallampati 1, neck circumference 14.5 , no nasal septal deviation, no retrognathia  Cardiovascular: Regular rate and rhythm , without murmurs or carotid bruit, and without distended neck veins.  Respiratory: Lungs are clear to auscultation.  Skin: Without evidence of edema, or rash  Trunk: BMI is elevated - patient has normal posture.   Neurologic exam:  The patient is awake and alert, pleasant and conversational, fully oriented to place and time.  Memory subjective  described as intact, but attention span & concentration ability were reportedly impaired. This is not evident here in the office, normal conversation. Speech is fluent without dysarthria, dysphonia or aphasia. Mood and affect are appropriate.  Cranial nerves:  Pupils are equal and briskly reactive to light. Extraocular movements in vertical and horizontal planes intact and without nystagmus. Visual fields by finger perimetry are intact. Hearing to finger rub intact. Facial sensation intact to fine touch. Facial motor strength is symmetric and tongue and uvula move midline.  Motor exam: Normal tone and normal muscle bulk and symmetric normal strength in all extremities. Good bilateral grip and pinch strength.  Sensory: Fine touch, pinprick and vibration were tested in all extremities. The patient is much less ticklish than she used to be, her fine filament sense appears decreased. Proprioception is tested in the upper extremities only. This was normal.  Coordination: Rapid alternating movements in the fingers/hands is tested and normal. Finger-to-nose maneuver tested and normal without evidence of ataxia,  dysmetria or tremor.  Gait and station: Patient walks without assistive device. Strength within normal limits. Stance is stable and normal.  Tandem gait is unfragmented. Romberg testing is normal.  Deep tendon reflexes: in the upper and lower extremities are symmetric, and very brisk - no clonus .  Babinski downgoing.   ASSESSMENT: 48 y.o. year old female  has a past medical history of Multiple sclerosis; Diabetes mellitus; Type 2 diabetes mellitus without (mention of) complications; Hyperlipemia; and Hypertension. here with:  1) Multiple sclerosis - no relapse clinically, but there is a a progression in the brain MRI over 7 years - we compared brain MRI studies from 2008 and 2014. Recent Cervical and Thoracic MRIs were stable.  2) Restless legs  3) DM type 2 - diabetic induced nocturia, vision blurring, and likely a beginning neuropathy.  4) obesity. Has lost weight since starting Januvia.  Plan:  Continue Betaseron until any changes occur.  Start Ultracet, 1-2 tabs every 6 hours as needed for back pain.  RLS- encouraged magnesium and K intake, gatorade has helped in the past, may try Magnesium supplement.  Meds ordered this encounter  Medications  . traMADol-acetaminophen (ULTRACET) 37.5-325 MG per tablet    Sig: Take 1-2 tablets by mouth every 6 (six) hours as needed for moderate pain.    Dispense:  60 tablet    Refill:  5    Order Specific Question:  Supervising Provider    Answer:  Brett Fairy, CARMEN [[id number]]   Return in about 6 months (around 12/14/2014) for MS, sooner as needed.  Philmore Pali, MSN, NP-C 06/13/2014, 5:40 PM [facility] [address] [place], [place] [address] [phone]  Note: This document was prepared with digital dictation and possible smart phrase technology. Any transcriptional errors that result from this process are unintentional.

## 2014-06-14 ENCOUNTER — Telehealth: Payer: Self-pay | Admitting: Nurse Practitioner

## 2014-06-14 NOTE — Telephone Encounter (Signed)
>>   She has concerns because the pharmacist told her tramadol can interfere with Betaseron and Sertraline. She is hesitant to take this medication until she gets an okay from [place]. Please advise.<<  Called and left a message for Gretel that I do not see any interactions between Ultracet and Betaseron on any of my refernces ([facility]), she could talk with the pharmacist who told her there was for more info.  There is a risk for serotonin syndrome with Ultracet and Sertraline, I explained briefly what the symptoms were.  I advised that if she was not comfortable with that risk then just do not take the Ultracet. She is to call to discuss if she wants.

## 2014-06-14 NOTE — Telephone Encounter (Signed)
I called the patient back.  She has concerns because the pharmacist told her tramadol can interfere with Betaseron and Sertraline.  She is hesitant to take this medication until she gets an okay from Erica Powell.  Please advise.  Thank you.

## 2014-06-14 NOTE — Progress Notes (Signed)
I agree with the assessment and plan as directed by NP .The patient is known to me .   Issabella Rix, MD  

## 2014-06-14 NOTE — Telephone Encounter (Signed)
Patient concerned traMADol-acetaminophen (ULTRACET) 37.5-325 MG per tablet may not interact with other meds prescribed.  Please return call asap due to traveling out of town tomorrow for conference.

## 2014-06-15 ENCOUNTER — Other Ambulatory Visit: Payer: Self-pay | Admitting: Obstetrics and Gynecology

## 2014-06-15 DIAGNOSIS — N63 Unspecified lump in unspecified breast: Secondary | ICD-10-CM

## 2014-06-20 ENCOUNTER — Other Ambulatory Visit: Payer: 59

## 2014-06-25 ENCOUNTER — Ambulatory Visit
Admission: RE | Admit: 2014-06-25 | Discharge: 2014-06-25 | Disposition: A | Discharge status: Home / Self Care | Payer: 59 | Source: Ambulatory Visit | Attending: Obstetrics and Gynecology | Admitting: Obstetrics and Gynecology

## 2014-06-25 DIAGNOSIS — N63 Unspecified lump in unspecified breast: Secondary | ICD-10-CM

## 2014-09-25 ENCOUNTER — Ambulatory Visit
Admission: RE | Admit: 2014-09-25 | Discharge: 2014-09-25 | Disposition: A | Discharge status: Home / Self Care | Payer: 59 | Source: Ambulatory Visit | Attending: Internal Medicine | Admitting: Internal Medicine

## 2014-09-25 ENCOUNTER — Other Ambulatory Visit: Payer: Self-pay | Admitting: Internal Medicine

## 2014-09-25 DIAGNOSIS — M25562 Pain in left knee: Secondary | ICD-10-CM

## 2014-10-12 ENCOUNTER — Other Ambulatory Visit: Payer: Self-pay | Admitting: Neurology

## 2014-11-08 ENCOUNTER — Other Ambulatory Visit: Payer: Self-pay

## 2014-11-08 DIAGNOSIS — G35 Multiple sclerosis: Secondary | ICD-10-CM

## 2014-11-08 MED ORDER — SERTRALINE HCL 50 MG PO TABS: 50.0000 mg | ORAL_TABLET | Freq: Every day | ORAL | Status: DC

## 2014-11-08 NOTE — Telephone Encounter (Signed)
Last prescribed at [facility] on 12/02

## 2014-12-18 ENCOUNTER — Ambulatory Visit (INDEPENDENT_AMBULATORY_CARE_PROVIDER_SITE_OTHER): Payer: 59 | Admitting: Neurology

## 2014-12-18 ENCOUNTER — Encounter: Payer: Self-pay | Admitting: Neurology

## 2014-12-18 VITALS — BP 135/87 | HR 84 | Resp 17 | Ht 66.0 in | Wt 197.0 lb

## 2014-12-18 DIAGNOSIS — Z5181 Encounter for therapeutic drug level monitoring: Secondary | ICD-10-CM

## 2014-12-18 DIAGNOSIS — G478 Other sleep disorders: Secondary | ICD-10-CM

## 2014-12-18 DIAGNOSIS — E0840 Diabetes mellitus due to underlying condition with diabetic neuropathy, unspecified: Secondary | ICD-10-CM

## 2014-12-18 DIAGNOSIS — G2581 Restless legs syndrome: Secondary | ICD-10-CM

## 2014-12-18 DIAGNOSIS — G4769 Other sleep related movement disorders: Secondary | ICD-10-CM

## 2014-12-18 MED ORDER — ROPINIROLE HCL 0.5 MG PO TABS: ORAL_TABLET | ORAL | Status: DC

## 2014-12-18 NOTE — Progress Notes (Addendum)
PATIENT: Erica Powell DOB: Mar 10, 1966  REASON FOR VISIT: routine follow up for MS HISTORY FROM: patient  HISTORY OF PRESENT ILLNESS: Erica Powell is a 49 y.o. left handed, caucasian female Is seen here as a routine once a year revisit for multiple sclerosis, originally referred from Dr. Laurann Montana.  Erica Powell has been a patient in our neurology clinic for the last 8 years, beginning in 2006.  She was diagnosed with multiple sclerosis and treatment was initiated. She had no recent MS relapses but a brain MRI that had been performed in January 2014 was showing still scattered periventricular, subcortical and pontine T2 hyperintensities consistent with chronic demyelinating plaques of various age.  No acute lesions were seen in her last MRI obtained on 12/29/2012. She had 2 separate studies in 2006 at [facility].  Initally she had C5 and C 6 lesions, these dorsal cervical cord lesions did not span multiple levels. The patient was diagnosed in 2012 with diabetes and has a strong DM family history.  Her last HbA1c just 2 month ago was 7.0 and had been in June 2014 at 8.3.  She noticed an improvement in her urinary frequency once her sugars were lower. She also has still no clinical progression or relapse, but she reports problems with concentration, cognition and sometimes with multitasking. She feels that she is easily distracted and loses her train of thought when this happens. She feels especially distracted by noises at her work. 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 There's been no change in her employment, hours of work , hobbies or social life. She has a bachelor's degree ,is single but lives with her fianc.  She recently noted a new symptom of tightness in her chest , a paraspinal tenderness and tightness, a confined feeling surrounding the chest , a possible variation of " the MS hug", she has responded well to massage, but the relief is not  sustained.    UPDATE 06/13/14 (LL): Since last visit, she has had no issues with her MS.  No injection site reactions.  Through her therapist it was suggested that she may have paradoxical response to benzos, specifically the Klonopin she was taking, making her irritable, with mood swings, and aggressive. Her job performance was criticized for rudeness.  Upon stopping the Klonopin, her moods leveled out and she has returned to "her normal self."  She continues to have back pain and muscle cramps, which are intermittent.  She has been intolerant to Baclofen, making her heart race and feel jittery; Flexeril and Tizanidine were tried and made her too tired.  She has not tried Mirapex for fear of side effects. She has lost weight, 7 lbs since starting Januvia for DMII, without any change in her routine.  She has no new complaints today.  Update 12-18-14(CD) : After Erica Powell was seen in  her last visit July of last year she has successfully weaned off Klonopin, prescribed for RLS.   It seems that the Klonopin caused her to be erratic impulsive and abrupt and she was actually criticized at the job for her rudeness. She's now back to her pleasant self.  She has been taking the same medication for over a decade  in terms off her MS control on Betaseron.  Since she is my patient she was diagnosed with diabetes and has been taking Glucophage later Januvia was added. She also has been prescribed Ultracet for pain by NP Lam. She needs one refill  today and takes an average of  1-2 a week po.  Her restless legs are back and are affecting her in daytime, anticipation.   RLS with myoclonic jerks- spinal lesions were searched for but none  Found.  MRI in  Jan 2015 : no new lesions in brain , no new lesions in c spine.                Review of Systems:  Out of a complete 14 system review, the patient complains of only the following symptoms, and all other reviewed systems are negative.   In today's yearly  visit the patient endorsed the following symptoms, itching . When at rest her right leg may produce myoclonic movements or spasms. She has massaged her leg to be able to feel better, or has to move - Klonopin helped but caused psychological changes.  She is tired, sleepier than she used to be. She still has a shift work schedule .  Endorsed were: distractability, hearing impairment or audio processing impairment, confusion, back pain,weakness and some tremors.   ALLERGIES: Allergies  Allergen Reactions  . Cephalosporins Itching and Swelling    Occurs with cephalexin (Keflex). Starts with prickling around the face, swelling/numb feeling of face.  . Hydrochlorothiazide Itching and Swelling    Swelling of the face, hives, itching, feeling of bad sunburn.   . Penicillins Itching and Swelling    Occurs with Augmentin and amoxicillin. Hives, prickling, swelling of face.   . Sulfa Antibiotics Itching and Swelling    Itching, hives, swelling of face  . Baclofen Other (See Comments)    Leg spasms, jittery, heart racing     HOME MEDICATIONS: Outpatient Prescriptions Prior to Visit  Medication Sig Dispense Refill  . acetaminophen (TYLENOL) 500 MG tablet Take 2 tablets (1,000 mg total) by mouth every other day. Prior to Betaseron injection.    [name] aspirin 81 MG tablet Take 1 tablet (81 mg total) by mouth daily.    [name] aspirin-acetaminophen-caffeine (EXCEDRIN MIGRAINE) 250-250-65 MG per tablet Take 2 tablets by mouth as needed (Headache).    [name] atorvastatin (LIPITOR) 10 MG tablet Take 20 mg by mouth daily.     [name] BETASERON 0.3 MG KIT injection INJECT 0.3 MGS INTO THE SKIN EVERY OTHER DAY 14 each PRN  . cholecalciferol (VITAMIN D) 1000 UNITS tablet Take 1,000 Units by mouth daily.    [name] lisinopril (PRINIVIL,ZESTRIL) 10 MG tablet Take 1 tablet (10 mg total) by mouth daily.    . Melatonin 10 MG TABS Take 10 mg by mouth at bedtime.    . metFORMIN (GLUCOPHAGE) 1000 MG tablet Take 1 tablet (1,000 mg total) by  mouth 2 (two) times daily with a meal.    . Multiple Vitamins-Iron (MULTIVITAMIN/IRON) TABS Take 1 tablet by mouth daily.  0  . sertraline (ZOLOFT) 50 MG tablet Take 1 tablet (50 mg total) by mouth daily. 90 tablet 1  . sitaGLIPtin (JANUVIA) 100 MG tablet Take 1 tablet (100 mg total) by mouth daily. 30 tablet 0  . traMADol-acetaminophen (ULTRACET) 37.5-325 MG per tablet Take 1-2 tablets by mouth every 6 (six) hours as needed for moderate pain. 60 tablet 5  . vitamin B-12 (CYANOCOBALAMIN) 1000 MCG tablet Take 1,000 mcg by mouth daily.     No facility-administered medications prior to visit.    PHYSICAL EXAM Filed Vitals:   12/18/14 1337  BP: 135/87  Pulse: 84  Resp: 17  Height: '5\' 6"'  (1.676 m)  Weight: 197 lb (89.359 kg)  Body mass index is 31.81 kg/(m^2).  Visual Acuity Screening   Right eye Left eye Both eyes  Without correction:     With correction: 20/30 20/30    Physical exam:  General: The patient is awake, alert and appears not in acute distress. The patient is well groomed.  Head: Normocephalic, atraumatic. Neck is supple. Mallampati 1, neck circumference 14.5 , no nasal septal deviation, no retrognathia  Cardiovascular: Regular rate and rhythm , without murmurs or carotid bruit, and without distended neck veins.  Respiratory: Lungs are clear to auscultation.  Skin: Without evidence of edema, or rash  Trunk: BMI is elevated - patient has normal posture.   Neurologic exam:  The patient is awake and alert, pleasant and conversational, fully oriented to place and time.  Memory subjective described as intact, but attention span & concentration ability were reportedly impaired. This is not evident here in the office, normal conversation.  Speech is fluent without dysarthria, dysphonia or aphasia. Mood and affect are appropriate.  Cranial nerves:  Pupils are equal and briskly reactive to light. Extraocular movements in vertical and horizontal planes intact and without  nystagmus.  Visual fields by finger perimetry are intact. Hearing to finger rub intact. Facial sensation intact to fine touch. Facial motor strength is symmetric and tongue and uvula move midline.  Motor exam: Normal tone and normal muscle bulk and symmetric normal strength in all extremities. Good bilateral grip and pinch strength.  Sensory: Fine touch, pinprick and vibration were tested in all extremities.  Feet:  fine filament sense appears decreased. Vibration preserved.  Proprioception is normal.  Coordination: Rapid alternating movements in the fingers/hands is tested and normal.  Finger-to-nose maneuver tested and normal without evidence of ataxia, dysmetria or tremor.  Gait and station: Patient walks without assistive device.  Strength within normal limits. Stance is stable and normal.  Tandem gait is unfragmented. Romberg testing is normal.  Deep tendon reflexes: in the upper and lower extremities are symmetric, and very brisk - but no clonus .  Babinski down going!.   ASSESSMENT: 49 y.o. year old female  has a past medical history of Multiple sclerosis; Diabetes mellitus; Type 2 diabetes mellitus without (mention of) complications; Hyperlipemia; and Hypertension. here with:  1) Multiple sclerosis - no relapse clinically, but there is a a progression in the brain MRI over 8 years - we compared brain MRI studies from 2008 and 2014, January 2015 : Cervical and Thoracic MRIs were stable.  Needs yearly labs, CMET and CBC diff.  2) Restless legs , responded to Klonopin, but side effects prohibitive. Now on magnesium. Requip low dose added.  Will check iron level, ferritin.  3) DM type 2 - diabetic induced nocturia, vision blurring, and likely a beginning neuropathy. Well controlled Glucose levels.  4) Obesity. Has lost weight since starting Januvia. Total weight loss until 12-18-14 14 pounds.   Plan:  Continue Betaseron until any changes occur.  Start Ultracet, 1-2 tabs every 6 hours as  needed for back pain.  RLS- encouraged magnesium and K intake, gatorade has helped in the past, may try Magnesium supplement.  Meds ordered this encounter  Medications  . traMADol-acetaminophen (ULTRACET) 37.5-325 MG per tablet    Sig: Take 1-2 tablets by mouth every 6 (six) hours as needed for moderate pain.    Dispense:  60 tablet    Refill:  5    Order Specific Question:  Supervising Provider    Answer:  Brett Fairy, Bridgitte Felicetti [[id number]]   Return in  about 12 months (around 12/14/2014) for MS, sooner as needed. No need now for MRI , as no clinical relapses occurred.  Fatigue attributed to diabetes and MS,  itching attributed to diabetic changes.  Labs drawn.   Larey Seat, MD  12/18/2014, 1:43 PM [facility] [address], [place], [place] [address] [phone]

## 2014-12-19 LAB — CBC WITH DIFFERENTIAL/PLATELET
BASOS ABS: 0 10*3/uL (ref 0.0–0.2)
BASOS: 0 %
EOS: 4 %
Eosinophils Absolute: 0.3 10*3/uL (ref 0.0–0.4)
HEMATOCRIT: 35.7 % (ref 34.0–46.6)
Hemoglobin: 12 g/dL (ref 11.1–15.9)
Immature Grans (Abs): 0 10*3/uL (ref 0.0–0.1)
Immature Granulocytes: 0 %
LYMPHS: 35 %
Lymphocytes Absolute: 2.5 10*3/uL (ref 0.7–3.1)
MCH: 27.8 pg (ref 26.6–33.0)
MCHC: 33.6 g/dL (ref 31.5–35.7)
MCV: 83 fL (ref 79–97)
MONOCYTES: 7 %
Monocytes Absolute: 0.5 10*3/uL (ref 0.1–0.9)
NEUTROS ABS: 3.9 10*3/uL (ref 1.4–7.0)
Neutrophils Relative %: 54 %
RBC: 4.31 x10E6/uL (ref 3.77–5.28)
RDW: 14.8 % (ref 12.3–15.4)
WBC: 7.4 10*3/uL (ref 3.4–10.8)

## 2014-12-19 LAB — COMPREHENSIVE METABOLIC PANEL
ALBUMIN: 4.6 g/dL (ref 3.5–5.5)
ALT: 17 IU/L (ref 0–32)
AST: 13 IU/L (ref 0–40)
Albumin/Globulin Ratio: 1.5 (ref 1.1–2.5)
Alkaline Phosphatase: 74 IU/L (ref 39–117)
BUN/Creatinine Ratio: 14 (ref 9–23)
BUN: 8 mg/dL (ref 6–24)
CO2: 25 mmol/L (ref 18–29)
Calcium: 10.1 mg/dL (ref 8.7–10.2)
Chloride: 98 mmol/L (ref 97–108)
Creatinine, Ser: 0.56 mg/dL — ABNORMAL LOW (ref 0.57–1.00)
GFR, EST AFRICAN AMERICAN: 128 mL/min/{1.73_m2} (ref >59)
GFR, EST NON AFRICAN AMERICAN: 111 mL/min/{1.73_m2} (ref >59)
GLUCOSE: 105 mg/dL — AB (ref 65–99)
Globulin, Total: 3 g/dL (ref 1.5–4.5)
POTASSIUM: 4.6 mmol/L (ref 3.5–5.2)
Sodium: 139 mmol/L (ref 134–144)
Total Bilirubin: 0.2 mg/dL (ref 0.0–1.2)
Total Protein: 7.6 g/dL (ref 6.0–8.5)

## 2014-12-19 LAB — FERRITIN: FERRITIN: 16 ng/mL (ref 15–150)

## 2014-12-19 LAB — IRON AND TIBC
Iron Saturation: 10 % — ABNORMAL LOW (ref 15–55)
Iron: 40 ug/dL (ref 35–155)
TIBC: 404 ug/dL (ref 250–450)
UIBC: 364 ug/dL (ref 150–375)

## 2014-12-20 ENCOUNTER — Encounter: Payer: Self-pay | Admitting: Neurology

## 2014-12-21 ENCOUNTER — Other Ambulatory Visit: Payer: Self-pay | Admitting: *Deleted

## 2014-12-21 DIAGNOSIS — D509 Iron deficiency anemia, unspecified: Secondary | ICD-10-CM

## 2014-12-21 NOTE — Progress Notes (Signed)
Order done per Dr. Vickey Huger for ferric gluconate 125mg  IV in NaCl 0.9% to given over 1 hour at outpt infusion facility.

## 2014-12-24 NOTE — Telephone Encounter (Signed)
I called and spoke to [facility] [id number] [place], X at [phone].  No precert needed.

## 2014-12-25 NOTE — Telephone Encounter (Signed)
It is OK, I am sorry  you have to wait that long. 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 CD

## 2015-01-02 ENCOUNTER — Other Ambulatory Visit: Payer: Self-pay

## 2015-01-02 MED ORDER — TRAMADOL-ACETAMINOPHEN 37.5-325 MG PO TABS: 1.0000 | ORAL_TABLET | Freq: Four times a day (QID) | ORAL | Status: DC | PRN

## 2015-01-02 NOTE — Telephone Encounter (Signed)
Rx signed and faxed.

## 2015-01-07 ENCOUNTER — Telehealth: Payer: Self-pay | Admitting: *Deleted

## 2015-01-07 NOTE — Telephone Encounter (Signed)
Form,[employer] to [facility] and Dr Dohmeier to be completed 01-07-15.

## 2015-01-08 ENCOUNTER — Encounter: Payer: Self-pay | Admitting: Neurology

## 2015-01-08 DIAGNOSIS — Z0289 Encounter for other administrative examinations: Secondary | ICD-10-CM

## 2015-01-08 NOTE — Telephone Encounter (Signed)
To Dr. Vickey Huger to sign.

## 2015-01-09 ENCOUNTER — Encounter: Payer: Self-pay | Admitting: Neurology

## 2015-01-09 ENCOUNTER — Encounter (HOSPITAL_COMMUNITY): Payer: 59

## 2015-01-09 NOTE — Telephone Encounter (Signed)
Form,[employer] received,completed by Dr Vickey Huger and Andrey Campanile faxed 01-08-15.

## 2015-01-10 ENCOUNTER — Telehealth: Payer: Self-pay | Admitting: *Deleted

## 2015-01-10 NOTE — Telephone Encounter (Signed)
Form,[employer] re faxed 01/10/15.

## 2015-01-10 NOTE — Telephone Encounter (Signed)
I spoke to pt and then Lupita Leash in MR to fax to numbers below, since [employer] did not receive first time.  Attention: Samantha Minshaw.

## 2015-02-01 ENCOUNTER — Encounter (HOSPITAL_COMMUNITY)
Admission: RE | Admit: 2015-02-01 | Discharge: 2015-02-01 | Disposition: A | Discharge status: Home / Self Care | Payer: 59 | Source: Ambulatory Visit | Attending: Neurology | Admitting: Neurology

## 2015-02-01 ENCOUNTER — Encounter (HOSPITAL_COMMUNITY): Payer: Self-pay

## 2015-02-01 VITALS — BP 121/60 | HR 71 | Temp 97.8°F | Resp 18 | Ht 65.0 in | Wt 196.0 lb

## 2015-02-01 DIAGNOSIS — D509 Iron deficiency anemia, unspecified: Secondary | ICD-10-CM | POA: Insufficient documentation

## 2015-02-01 HISTORY — DX: Anemia, unspecified: D64.9

## 2015-02-01 MED ORDER — SODIUM CHLORIDE 0.9 % IV SOLN
Freq: Once | INTRAVENOUS | Status: AC
  Administered 2015-02-01: 09:00:00 via INTRAVENOUS

## 2015-02-01 MED ORDER — SODIUM CHLORIDE 0.9 % IV SOLN
125.0000 mg | Freq: Once | INTRAVENOUS | Status: AC
  Administered 2015-02-01: 125 mg via INTRAVENOUS
  Filled 2015-02-01: qty 10

## 2015-02-01 NOTE — Discharge Instructions (Signed)
Sodium Ferric Gluconate Complex injection What is this medicine? SODIUM FERRIC GLUCONATE COMPLEX (SOE dee um FER ik GLOO koe nate KOM pleks) is an iron replacement. It is used with epoetin therapy to treat low iron levels in patients who are receiving hemodialysis. This medicine may be used for other purposes; ask your health care provider or pharmacist if you have questions. COMMON BRAND NAME(S): Ferrlecit, Nulecit What should I tell my health care provider before I take this medicine? They need to know if you have any of the following conditions: -anemia that is not from iron deficiency -high levels of iron in the body -an unusual or allergic reaction to iron, benzyl alcohol, other medicines, foods, dyes, or preservatives -pregnant or are trying to become pregnant -breast-feeding How should I use this medicine? This medicine is for infusion into a vein. It is given by a health care professional in a hospital or clinic setting. Talk to your pediatrician regarding the use of this medicine in children. While this drug may be prescribed for children as young as 6 years old for selected conditions, precautions do apply. Overdosage: If you think you have taken too much of this medicine contact a poison control center or emergency room at once. NOTE: This medicine is only for you. Do not share this medicine with others. What if I miss a dose? It is important not to miss your dose. Call your doctor or health care professional if you are unable to keep an appointment. What may interact with this medicine? Do not take this medicine with any of the following medications: -deferoxamine -dimercaprol -other iron products This medicine may also interact with the following medications: -chloramphenicol -deferasirox -medicine for blood pressure like enalapril This list may not describe all possible interactions. Give your health care provider a list of all the medicines, herbs, non-prescription drugs,  or dietary supplements you use. Also tell them if you smoke, drink alcohol, or use illegal drugs. Some items may interact with your medicine. What should I watch for while using this medicine? Your condition will be monitored carefully while you are receiving this medicine. Visit your doctor for check-ups as directed. What side effects may I notice from receiving this medicine? Side effects that you should report to your doctor or health care professional as soon as possible: -allergic reactions like skin rash, itching or hives, swelling of the face, lips, or tongue -breathing problems -changes in hearing -changes in vision -chills, flushing, or sweating -fast, irregular heartbeat -feeling faint or lightheaded, falls -fever, flu-like symptoms -high or low blood pressure -pain, tingling, numbness in the hands or feet -severe pain in the chest, back, flanks, or groin -swelling of the ankles, feet, hands -trouble passing urine or change in the amount of urine -unusually weak or tired Side effects that usually do not require medical attention (report to your doctor or health care professional if they continue or are bothersome): -cramps -dark colored stools -diarrhea -headache -nausea, vomiting -stomach upset This list may not describe all possible side effects. Call your doctor for medical advice about side effects. You may report side effects to FDA at 1-800-FDA-1088. Where should I keep my medicine? This drug is given in a hospital or clinic and will not be stored at home. NOTE: This sheet is a summary. It may not cover all possible information. If you have questions about this medicine, talk to your doctor, pharmacist, or health care provider.  2015, Elsevier/Gold Standard. (2008-07-25 15:58:57)  

## 2015-02-01 NOTE — Progress Notes (Signed)
Uneventful infusion of ferric gluconate(nulecit) 125 mg over 1 hour with 15 minute post infusion observation. Pt discharged ambulatory unaccompanied to her car.

## 2015-02-20 ENCOUNTER — Encounter (HOSPITAL_COMMUNITY): Payer: 59

## 2015-04-25 ENCOUNTER — Other Ambulatory Visit: Payer: Self-pay | Admitting: Neurology

## 2015-04-25 NOTE — Telephone Encounter (Signed)
Originally prescribed at [facility] on 12/02

## 2015-06-03 ENCOUNTER — Other Ambulatory Visit: Payer: Self-pay

## 2015-06-24 ENCOUNTER — Other Ambulatory Visit: Payer: Self-pay | Admitting: Neurology

## 2015-06-24 NOTE — Telephone Encounter (Signed)
Rx has been signed and faxed  

## 2015-07-10 ENCOUNTER — Encounter: Payer: Self-pay | Admitting: Neurology

## 2015-07-17 ENCOUNTER — Encounter: Payer: Self-pay | Admitting: Neurology

## 2015-07-17 ENCOUNTER — Ambulatory Visit (INDEPENDENT_AMBULATORY_CARE_PROVIDER_SITE_OTHER): Payer: 59 | Admitting: Neurology

## 2015-07-17 VITALS — BP 132/78 | HR 86 | Resp 20 | Ht 66.0 in | Wt 199.0 lb

## 2015-07-17 DIAGNOSIS — G35 Multiple sclerosis: Secondary | ICD-10-CM

## 2015-07-17 DIAGNOSIS — L299 Pruritus, unspecified: Secondary | ICD-10-CM

## 2015-07-17 DIAGNOSIS — G2581 Restless legs syndrome: Secondary | ICD-10-CM | POA: Diagnosis not present

## 2015-07-17 DIAGNOSIS — G35D Multiple sclerosis, unspecified: Secondary | ICD-10-CM

## 2015-07-17 MED ORDER — BETASERON 0.3 MG ~~LOC~~ KIT: PACK | SUBCUTANEOUS | Status: DC

## 2015-07-17 MED ORDER — ROPINIROLE HCL 0.5 MG PO TABS: ORAL_TABLET | ORAL | Status: DC

## 2015-07-17 MED ORDER — HYDROXYZINE HCL 10 MG PO TABS: 10.0000 mg | ORAL_TABLET | Freq: Three times a day (TID) | ORAL | Status: DC | PRN

## 2015-07-17 NOTE — Patient Instructions (Signed)
Restless Legs Syndrome Restless legs syndrome is a movement disorder. It may also be called a sensorimotor disorder.  CAUSES  No one knows what specifically causes restless legs syndrome, but it tends to run in families. It is also more common in people with low iron, in pregnancy, in people who need dialysis, and those with nerve damage (neuropathy).Some medications may make restless legs syndrome worse.Those medications include drugs to treat high blood pressure, some heart conditions, nausea, colds, allergies, and depression. SYMPTOMS Symptoms include uncomfortable sensations in the legs. These leg sensations are worse during periods of inactivity or rest. They are also worse while sitting or lying down. Individuals that have the disorder describe sensations in the legs that feel like:  Pulling.  Drawing.  Crawling.  Worming.  Boring.  Tingling.  Pins and needles.  Prickling.  Pain. The sensations are usually accompanied by an overwhelming urge to move the legs. Sudden muscle jerks may also occur. Movement provides temporary relief from the discomfort. In rare cases, the arms may also be affected. Symptoms may interfere with going to sleep (sleep onset insomnia). Restless legs syndrome may also be related to periodic limb movement disorder (PLMD). PLMD is another more common motor disorder. It also causes interrupted sleep. The symptoms from PLMD usually occur most often when you are awake. TREATMENT  Treatment for restless legs syndrome is symptomatic. This means that the symptoms are treated.   Massage and cold compresses may provide temporary relief.  Walk, stretch, or take a cold or hot bath.  Get regular exercise and a good night's sleep.  Avoid caffeine, alcohol, nicotine, and medications that can make it worse.  Do activities that provide mental stimulation like discussions, needlework, and video games. These may be helpful if you are not able to walk or stretch. Some  medications are effective in relieving the symptoms. However, many of these medications have side effects. Ask your caregiver about medications that may help your symptoms. Correcting iron deficiency may improve symptoms for some patients. Document Released: 11/13/2002 Document Revised: 04/09/2014 Document Reviewed: 02/19/2011 ExitCare Patient Information 2015 [employer]. This information is not intended to replace advice given to you by your health care provider. Make sure you discuss any questions you have with your health care provider.  

## 2015-07-17 NOTE — Progress Notes (Signed)
PATIENT: Erica Powell DOB: 1966/07/04  REASON FOR VISIT: routine follow up for MS HISTORY FROM: patient  HISTORY OF PRESENT ILLNESS: Erica Powell is a 49 y.o. left handed, caucasian female Is seen here as a routine once a year revisit for multiple sclerosis, originally referred from Dr. Laurann Montana.  Erica Powell has been a patient in our neurology clinic for the last 8 years, beginning in 2006.  She was diagnosed with multiple sclerosis and treatment was initiated. She had no recent MS relapses but a brain MRI that had been performed in January 2014 was showing still scattered periventricular, subcortical and pontine T2 hyperintensities consistent with chronic demyelinating plaques of various age.  No acute lesions were seen in her last MRI obtained on 12/29/2012. She had 2 separate studies in 2006 at [facility].  Initally she had C5 and C 6 lesions, these dorsal cervical cord lesions did not span multiple levels. The patient was diagnosed in 2012 with diabetes and has a strong DM family history.  Her last HbA1c just 2 month ago was 7.0 and had been in June 2014 at 8.3.  She noticed an improvement in her urinary frequency once her sugars were lower. She also has still no clinical progression or relapse, but she reports problems with concentration, cognition and sometimes with multitasking. She feels that she is easily distracted and loses her train of thought when this happens. She feels especially distracted by noises at her work. 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 There's been no change in her employment, hours of work , hobbies or social life. She has a bachelor's degree ,is single but lives with her fianc.  She recently noted a new symptom of tightness in her chest , a paraspinal tenderness and tightness, a confined feeling surrounding the chest , a possible variation of " the MS hug", she has responded well to massage, but the relief is not  sustained.    UPDATE 06/13/14 (LL): Since last visit, she has had no issues with her MS.  No injection site reactions.  Through her therapist it was suggested that she may have paradoxical response to benzos, specifically the Klonopin she was taking, making her irritable, with mood swings, and aggressive. Her job performance was criticized for rudeness.  Upon stopping the Klonopin, her moods leveled out and she has returned to "her normal self."  She continues to have back pain and muscle cramps, which are intermittent.  She has been intolerant to Baclofen, making her heart race and feel jittery; Flexeril and Tizanidine were tried and made her too tired.  She has not tried Mirapex for fear of side effects. She has lost weight, 7 lbs since starting Januvia for DMII, without any change in her routine.  She has no new complaints today.  Update 12-18-14(CD) : After Erica Powell was seen in  her last visit July of last year she has successfully weaned off Klonopin, prescribed for RLS.   It seems that the Klonopin caused her to be erratic impulsive and abrupt and she was actually criticized at the job for her rudeness. She's now back to her pleasant self.  She has been taking the same medication for over a decade  in terms off her MS control on Betaseron.  Since she is my patient she was diagnosed with diabetes and has been taking Glucophage later Januvia was added. She also has been prescribed Ultracet for pain by NP Lam. She needs one  refill today and takes an average of  1-2 a week po.  Her restless legs are back and are affecting her in daytime, anticipation.   RLS with myoclonic jerks- spinal lesions were searched for but none  Found.  MRI in  Jan 2015 : no new lesions in brain , no new lesions in c spine.   Update from 07-17-15, Erica Powell is here today following her January visit. She presents today for an earlier visits and originally scheduled because she developed restless legs and leg twitching. She  also has skin itching which her primary care physician no longer fields can be attributed to diabetes. Her diabetes was very well controlled over the last 6 months.  She scored very low with a ferritin level of only 16 she also had a high total iron binding capacity her iron level was 40 mcg/dL which is on the very lowest of normal and her iron saturation was only 10% below normal. Her last fasting glucose 7 months ago was 105 done in this office.  Dr. Laurann Montana may have  labs of more recent data.  Electrolytes and liver function tests were normal white blood cell and red blood circumference normal there was no evidence of anemia. The differential was intact. Her creatinine is normal, no indication impaired kidney function.she does have very mild proteinuria.   Hydroxazine 25 mg at bedtime helped itching and flushing. I will refill that.         Review of Systems:  Out of a complete 14 system review, the patient complains of only the following symptoms, and all other reviewed systems are negative.  In today's yearly visit the patient endorsed the following symptoms, itching . When at rest her right leg may produce myoclonic movements or spasms. She has massaged her leg to be able to feel better, or has to move - Klonopin helped but caused psychological changes. She is still fatigued , sleepier than she used to be.  She still has a shift work schedule, worked over 90 hours last week. She feels  New onset of RLS, the irresistible  urge to move and jerk- myoclonic.  .  Endorsed were: distractability, hearing impairment or audio processing impairment, confusion, back pain,weakness and some tremors.   ALLERGIES: Allergies  Allergen Reactions  . Cephalosporins Itching and Swelling    Occurs with cephalexin (Keflex). Starts with prickling around the face, swelling/numb feeling of face.  . Hydrochlorothiazide Itching and Swelling    Swelling of the face, hives, itching, feeling of bad sunburn.   .  Penicillins Itching and Swelling    Occurs with Augmentin and amoxicillin. Hives, prickling, swelling of face.   . Sulfa Antibiotics Itching and Swelling    Itching, hives, swelling of face  . Baclofen Other (See Comments)    Leg spasms, jittery, heart racing   . Clonazepam     Males me mean    HOME MEDICATIONS: Outpatient Prescriptions Prior to Visit  Medication Sig Dispense Refill  . acetaminophen (TYLENOL) 500 MG tablet Take 2 tablets (1,000 mg total) by mouth every other day. Prior to Betaseron injection.    [name] aspirin 81 MG tablet Take 1 tablet (81 mg total) by mouth daily.    [name] aspirin-acetaminophen-caffeine (EXCEDRIN MIGRAINE) 250-250-65 MG per tablet Take 2 tablets by mouth as needed (Headache).    [name] atorvastatin (LIPITOR) 10 MG tablet Take 20 mg by mouth daily.     [name] BETASERON 0.3 MG KIT injection INJECT 0.3 MGS INTO THE SKIN EVERY  OTHER DAY 14 each PRN  . cholecalciferol (VITAMIN D) 1000 UNITS tablet Take 1,000 Units by mouth daily.    . ferrous sulfate 325 (65 FE) MG tablet Take 325 mg by mouth daily with breakfast.    . lisinopril (PRINIVIL,ZESTRIL) 10 MG tablet Take 1 tablet (10 mg total) by mouth daily.    . magnesium gluconate (MAGONATE) 500 MG tablet Take 500 mg by mouth 2 (two) times daily.    . Melatonin 10 MG TABS Take 10 mg by mouth at bedtime.    . metFORMIN (GLUCOPHAGE) 1000 MG tablet Take 1 tablet (1,000 mg total) by mouth 2 (two) times daily with a meal.    . Multiple Vitamins-Iron (MULTIVITAMIN/IRON) TABS Take 1 tablet by mouth daily.  0  . RESTASIS 0.05 % ophthalmic emulsion   1  . rOPINIRole (REQUIP) 0.5 MG tablet Take 1/2 tab at night time , one hour before onset of symptoms. 30 tablet 2  . sertraline (ZOLOFT) 50 MG tablet TAKE 1 TABLET BY MOUTH DAILY. 90 tablet 1  . sitaGLIPtin (JANUVIA) 100 MG tablet Take 1 tablet (100 mg total) by mouth daily. 30 tablet 0  . traMADol-acetaminophen (ULTRACET) 37.5-325 MG per tablet TAKE 1-2 TABLETS BY MOUTH EVERY 6 HOURS AS  NEEDED FOR MODERATE PAIN 60 tablet 5  . vitamin B-12 (CYANOCOBALAMIN) 1000 MCG tablet Take 1,000 mcg by mouth daily.     No facility-administered medications prior to visit.    PHYSICAL EXAM Filed Vitals:   07/17/15 1151  BP: 132/78  Pulse: 86  Resp: 20  Height: _0  (1.676 m)  Weight: 199 lb (90.266 kg)   Body mass index is 32.13 kg/(m^2). No exam data present Physical exam:  General: The patient is awake, alert and appears not in acute distress. The patient is well groomed.  Head: Normocephalic, atraumatic. Neck is supple. Mallampati 1, neck circumference 15.5 , no nasal septal deviation, no retrognathia , but reported snoring  Cardiovascular: Regular rate and rhythm , without murmurs or carotid bruit, and without distended neck veins.  Respiratory: Lungs are clear to auscultation.  Skin: Without evidence of edema, or rash  Trunk: BMI is elevated - patient has normal posture.   Neurologic exam:  The patient is awake and alert, pleasant and conversational, fully oriented to place and time.  Memory subjective described as intact, but attention span & concentration ability were reportedly impaired. This is not evident here in the office, normal conversation.  Speech is fluent without dysarthria, dysphonia or aphasia. Mood and affect are appropriate.  Cranial nerves:  Pupils are equal and briskly reactive to light. Extraocular movements in vertical and horizontal planes intact and without nystagmus.  Visual fields by finger perimetry are intact. Hearing to finger rub intact. Facial sensation intact to fine touch. Facial motor strength is symmetric and tongue and uvula move midline.  Motor exam: Normal tone and normal muscle bulk and symmetric normal strength in all extremities. Good bilateral grip and pinch strength.  Sensory: Fine touch, pinprick and vibration were tested in all extremities.  Feet:  fine filament sense appears decreased. Vibration preserved.  Proprioception is  normal.  Coordination: Rapid alternating movements in the fingers/hands is tested and normal.  Finger-to-nose maneuver tested and normal without evidence of ataxia, dysmetria or tremor.  Gait and station: Patient walks without assistive device.  Strength within normal limits. Stance is stable and normal.  Tandem gait is unfragmented. Romberg testing is normal.  Deep tendon reflexes: in the upper and lower extremities  are symmetric, and very brisk - but no clonus .  Babinski down going!.   ASSESSMENT: 49 y.o. year old female  has a past medical history of Multiple sclerosis; Diabetes mellitus; Type 2 diabetes mellitus without (mention of) complications; Hyperlipemia; Hypertension; and Anemia. here with:  1) Multiple sclerosis - no relapse clinically, but there is a a progression in the brain MRI over 8 years - we compared brain MRI studies from 2008 and 2014, January 2015 : Cervical and Thoracic MRIs were stable.  Needs yearly labs, CMET and CBC diff.  2) restless legs symptoms now with the f irresistible urge to more standup rub her legs etc. to get rid of the dysesthesias. 3) iron deficiency clearly too low ferritin high iron binding capacity and low iron saturation. The patient is  On requip , a  persistent medication for the treatment of restless legs, at 0.25 mg , she takes oral iron, still is deficient ? Will repeat Iron and ferritin today, since she has RLS she may be able to join our restless legs study.she has no sign of neuropathy. [name] 4) DM type 2 - Well controlled Glucose levels.  5) Obesity. Has lost weight since starting Januvia. Total weight loss until 12-18-14 14 pounds.   Plan:  Continue Betaseron until any changes occur.  FMLA to reflect that the patient may Erica 2 days per half year due to MS related symptoms or treatment.  RLS- possible  Trial.    Meds ordered this encounter  Medications  . traMADol-acetaminophen (ULTRACET) 37.5-325 MG per tablet    Sig: Take 1-2 tablets  by mouth every 6 (six) hours as needed for moderate pain.    Dispense:  60 tablet    Refill:  5    Order Specific Question:  Supervising Provider    Answer:  Brett Fairy, Vinisha Faxon [[id number]]   Return in about 2 months (around 09/14/2015) RLS enrollment , And in 6 month for MS, sooner as needed. No need now for MRI , as no clinical relapses occurred.  Fatigue attributed to diabetes and MS, Hydroxazine for itching.  Labs drawn.   Larey Seat, MD  07/17/2015, 12:08 PM [facility] [address], [place], [place] [address] [phone]

## 2015-07-19 ENCOUNTER — Encounter: Payer: Self-pay | Admitting: Neurology

## 2015-07-25 ENCOUNTER — Other Ambulatory Visit: Payer: Self-pay | Admitting: Obstetrics and Gynecology

## 2015-07-26 LAB — CYTOLOGY - PAP

## 2015-07-29 ENCOUNTER — Other Ambulatory Visit: Payer: Self-pay | Admitting: Obstetrics and Gynecology

## 2015-07-29 DIAGNOSIS — R928 Other abnormal and inconclusive findings on diagnostic imaging of breast: Secondary | ICD-10-CM

## 2015-07-30 ENCOUNTER — Other Ambulatory Visit: Payer: Self-pay | Admitting: Obstetrics and Gynecology

## 2015-07-30 DIAGNOSIS — R928 Other abnormal and inconclusive findings on diagnostic imaging of breast: Secondary | ICD-10-CM

## 2015-08-02 ENCOUNTER — Ambulatory Visit
Admission: RE | Admit: 2015-08-02 | Discharge: 2015-08-02 | Disposition: A | Discharge status: Home / Self Care | Payer: 59 | Source: Ambulatory Visit | Attending: Obstetrics and Gynecology | Admitting: Obstetrics and Gynecology

## 2015-08-02 DIAGNOSIS — R928 Other abnormal and inconclusive findings on diagnostic imaging of breast: Secondary | ICD-10-CM

## 2015-08-03 ENCOUNTER — Emergency Department (HOSPITAL_COMMUNITY)
Admission: EM | Admit: 2015-08-03 | Discharge: 2015-08-03 | Disposition: A | Discharge status: Home / Self Care | Payer: PRIVATE HEALTH INSURANCE | Attending: Emergency Medicine | Admitting: Emergency Medicine

## 2015-08-03 ENCOUNTER — Encounter (HOSPITAL_COMMUNITY): Payer: Self-pay | Admitting: Emergency Medicine

## 2015-08-03 DIAGNOSIS — G35 Multiple sclerosis: Secondary | ICD-10-CM | POA: Insufficient documentation

## 2015-08-03 DIAGNOSIS — Y998 Other external cause status: Secondary | ICD-10-CM | POA: Insufficient documentation

## 2015-08-03 DIAGNOSIS — S01111A Laceration without foreign body of right eyelid and periocular area, initial encounter: Secondary | ICD-10-CM | POA: Diagnosis not present

## 2015-08-03 DIAGNOSIS — D649 Anemia, unspecified: Secondary | ICD-10-CM | POA: Diagnosis not present

## 2015-08-03 DIAGNOSIS — Y9389 Activity, other specified: Secondary | ICD-10-CM | POA: Diagnosis not present

## 2015-08-03 DIAGNOSIS — E119 Type 2 diabetes mellitus without complications: Secondary | ICD-10-CM | POA: Diagnosis not present

## 2015-08-03 DIAGNOSIS — Z79899 Other long term (current) drug therapy: Secondary | ICD-10-CM | POA: Diagnosis not present

## 2015-08-03 DIAGNOSIS — Z7982 Long term (current) use of aspirin: Secondary | ICD-10-CM | POA: Diagnosis not present

## 2015-08-03 DIAGNOSIS — Z880 Allergy status to penicillin: Secondary | ICD-10-CM | POA: Diagnosis not present

## 2015-08-03 DIAGNOSIS — E785 Hyperlipidemia, unspecified: Secondary | ICD-10-CM | POA: Insufficient documentation

## 2015-08-03 DIAGNOSIS — S0501XA Injury of conjunctiva and corneal abrasion without foreign body, right eye, initial encounter: Secondary | ICD-10-CM | POA: Diagnosis not present

## 2015-08-03 DIAGNOSIS — Y9289 Other specified places as the place of occurrence of the external cause: Secondary | ICD-10-CM | POA: Insufficient documentation

## 2015-08-03 DIAGNOSIS — I10 Essential (primary) hypertension: Secondary | ICD-10-CM | POA: Insufficient documentation

## 2015-08-03 DIAGNOSIS — Y288XXA Contact with other sharp object, undetermined intent, initial encounter: Secondary | ICD-10-CM | POA: Insufficient documentation

## 2015-08-03 DIAGNOSIS — S0591XA Unspecified injury of right eye and orbit, initial encounter: Secondary | ICD-10-CM | POA: Diagnosis present

## 2015-08-03 DIAGNOSIS — S0990XA Unspecified injury of head, initial encounter: Secondary | ICD-10-CM | POA: Diagnosis not present

## 2015-08-03 MED ORDER — TETANUS-DIPHTH-ACELL PERTUSSIS 5-2.5-18.5 LF-MCG/0.5 IM SUSP: 0.5000 mL | Freq: Once | INTRAMUSCULAR | Status: DC

## 2015-08-03 MED ORDER — ONDANSETRON HCL 4 MG/2ML IJ SOLN: 4.0000 mg | Freq: Once | INTRAMUSCULAR | Status: DC | PRN

## 2015-08-03 MED ORDER — HYDROMORPHONE HCL 1 MG/ML IJ SOLN: 1.0000 mg | INTRAMUSCULAR | Status: DC | PRN

## 2015-08-03 MED ORDER — BACITRACIN ZINC 500 UNIT/GM EX OINT
1.0000 "application " | TOPICAL_OINTMENT | Freq: Once | CUTANEOUS | Status: AC
  Administered 2015-08-03: 1 via TOPICAL
  Filled 2015-08-03: qty 0.9

## 2015-08-03 NOTE — Discharge Instructions (Signed)

## 2015-08-03 NOTE — ED Notes (Signed)
Visual acuity completed , left eye with contacts read 25/20 , right eye has no contacts, unable to read letters.

## 2015-08-03 NOTE — ED Notes (Addendum)
Pt reports that she was preparing a vial of medical and vial exploded and glass went in to eye. Pt c/o right eyelid laceration.Pt reports that she flushed her eye out. This a world related incident. Pt reports that she wears contact lenses and that it is hard to determine if she has vision changes due to contact lenses being removed. No visible pieces of glass noted to eye.

## 2015-08-03 NOTE — ED Provider Notes (Signed)
CSN: [id number]     Arrival date & time 08/03/15  0527 History   First MD Initiated Contact with Patient 08/03/15 [phone]     Chief Complaint  Patient presents with  . Eye Injury   Patient is a 49 y.o. female presenting with eye injury. The history is provided by the patient.  Eye Injury This is a new problem. The current episode started 3 to 5 hours ago. The problem occurs constantly. The problem has been rapidly improving. Associated symptoms include headaches. Pertinent negatives include no abdominal pain and no shortness of breath. Nothing aggravates the symptoms. Treatments tried: She used the eyewash sink. She irrigated out her laceration and her eye. She also removed her contact lens from her right eye.   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 She was preparing a vial of bupivacaine using a syringe. The vial broke under pressure. Patient states it primarily broke into 2 pieces. It did not break up and several shards. One of the large pieces struck her on her right eye. Patient denies any trouble with her vision. She has pain primarily on her eyelid where she sustained a laceration. She states her vision is blurry now because she does not have her contact lens in. She feels that her near vision is normal. She can read small print without any difficulty when itself close-up which is usual for her. Contact lenses are for distant vision. Past Medical History  Diagnosis Date  . Multiple sclerosis   . Diabetes mellitus   . Type 2 diabetes mellitus without (mention of) complications   . Hyperlipemia   . Hypertension   . Anemia    Past Surgical History  Procedure Laterality Date  . Essure tubal ligation    . Bone cyst excision    . Wisdom tooth extraction  1985   Family History  Problem Relation Age of Onset  . Stroke Mother    Social History  Substance Use Topics  . Smoking status: Never Smoker   . Smokeless tobacco: Never Used  . Alcohol Use: No   OB History    No data  available     Review of Systems  Respiratory: Negative for shortness of breath.   Gastrointestinal: Negative for abdominal pain.  Neurological: Positive for headaches.  All other systems reviewed and are negative.     Allergies  Cephalosporins; Hydrochlorothiazide; Penicillins; Sulfa antibiotics; Baclofen; and Clonazepam  Home Medications   Prior to Admission medications   Medication Sig Start Date End Date Taking? Authorizing Provider  acetaminophen (TYLENOL) 500 MG tablet Take 2 tablets (1,000 mg total) by mouth every other day. Prior to Betaseron injection. 10/16/11  Yes   aspirin 81 MG tablet Take 1 tablet (81 mg total) by mouth daily. 10/16/11  Yes   atorvastatin (LIPITOR) 10 MG tablet Take 20 mg by mouth daily.  10/16/11  Yes   BETASERON 0.3 MG KIT injection INJECT 0.3 MGS INTO THE SKIN EVERY OTHER DAY 07/17/15  Yes Larey Seat, MD  cholecalciferol (VITAMIN D) 1000 UNITS tablet Take 1,000 Units by mouth daily.   Yes Historical Provider, MD  ferrous sulfate 325 (65 FE) MG tablet Take 325 mg by mouth daily with breakfast.   Yes Historical Provider, MD  hydrOXYzine (ATARAX/VISTARIL) 10 MG tablet Take 1 tablet (10 mg total) by mouth 3 (three) times daily as needed. Patient taking differently: Take 10 mg by mouth 3 (three) times daily as needed for itching.  07/17/15  Yes Larey Seat, MD  lisinopril (PRINIVIL,ZESTRIL) 10 MG tablet Take 1 tablet (10 mg total) by mouth daily. 10/16/11  Yes   magnesium gluconate (MAGONATE) 500 MG tablet Take 500 mg by mouth 2 (two) times daily.   Yes Historical Provider, MD  Melatonin 10 MG TABS Take 10 mg by mouth at bedtime.   Yes Historical Provider, MD  metFORMIN (GLUCOPHAGE) 1000 MG tablet Take 1 tablet (1,000 mg total) by mouth 2 (two) times daily with a meal. 10/16/11  Yes   RESTASIS 0.05 % ophthalmic emulsion Place 1 drop into both eyes daily.  09/25/14  Yes Historical Provider, MD  rOPINIRole (REQUIP) 0.5 MG tablet Take 1/2 tab at night time ,  one hour before onset of symptoms. 07/17/15  Yes Carmen Dohmeier, MD  sertraline (ZOLOFT) 50 MG tablet TAKE 1 TABLET BY MOUTH DAILY. 04/25/15  Yes Carmen Dohmeier, MD  sitaGLIPtin (JANUVIA) 100 MG tablet Take 1 tablet (100 mg total) by mouth daily. 03/15/14  Yes Carmen Dohmeier, MD  traMADol-acetaminophen (ULTRACET) 37.5-325 MG per tablet TAKE 1-2 TABLETS BY MOUTH EVERY 6 HOURS AS NEEDED FOR MODERATE PAIN 06/24/15  Yes Larey Seat, MD  vitamin B-12 (CYANOCOBALAMIN) 1000 MCG tablet Take 1,000 mcg by mouth daily.   Yes Historical Provider, MD   BP 130/68 mmHg  Pulse 81  Temp(Src) 98.4 F (36.9 C) (Oral)  Resp 16  Ht 5' 5" (1.651 m)  Wt 195 lb (88.451 kg)  BMI 32.45 kg/m2  SpO2 99%  LMP 12/02/2013 Physical Exam  Constitutional: She appears well-developed and well-nourished. No distress.  HENT:  Head: Normocephalic and atraumatic.  Right Ear: External ear normal.  Left Ear: External ear normal.  Eyes: Conjunctivae and EOM are normal. Pupils are equal, round, and reactive to light. Lids are everted and swept, no foreign bodies found. Right eye exhibits no discharge. Left eye exhibits no discharge. Right conjunctiva has no hemorrhage. Left conjunctiva has no hemorrhage. No scleral icterus.  Well approximated superficial laceration right eyelid, small pinpoint area of fluorescein uptake at approximately the 1:00 position of the periphery of the iris, no evidence of ocular penetration, no hyphema, cornea clear  Neck: Neck supple. No tracheal deviation present.  Cardiovascular: Normal rate.   Pulmonary/Chest: Effort normal. No stridor. No respiratory distress.  Musculoskeletal: She exhibits no edema.  Neurological: She is alert. Cranial nerve deficit: no gross deficits.  Skin: Skin is warm and dry. No rash noted.  Psychiatric: She has a normal mood and affect.  Nursing note and vitals reviewed.   ED Course  Procedures (including critical care time)   MDM   Final diagnoses:  Eyelid  laceration, right, initial encounter  Corneal abrasion, right, initial encounter    Laceration of eyelid.  Superficial   No need for repair.  Irrigate and bacitracin ointment.  Very small abrasion of right cornea.  Could be related to the injury or when her contact lens was removed.  No sign of retained foreign body or penetrating eye injury.   Dorie Rank, MD 08/03/15 [phone]

## 2015-08-03 NOTE — ED Notes (Signed)
Pt driving self home.  

## 2015-08-27 ENCOUNTER — Ambulatory Visit: Payer: 59 | Admitting: Neurology

## 2015-10-14 ENCOUNTER — Other Ambulatory Visit: Payer: Self-pay | Admitting: Neurology

## 2015-12-17 ENCOUNTER — Ambulatory Visit: Payer: 59 | Admitting: Adult Health

## 2015-12-18 MED FILL — FLUOCINONIDE 0.05% CREAM: 0.05 | 30 days supply | Qty: 60 | Fill #0

## 2015-12-23 ENCOUNTER — Other Ambulatory Visit: Payer: Self-pay | Admitting: Neurology

## 2015-12-24 ENCOUNTER — Other Ambulatory Visit: Payer: Self-pay

## 2015-12-24 MED ORDER — TRAMADOL-ACETAMINOPHEN 37.5-325 MG PO TABS: ORAL_TABLET | ORAL | Status: DC

## 2015-12-24 MED FILL — TRAMADOL-APAP 37.5-325 TAB: 37.5-325 | 8 days supply | Qty: 60 | Fill #0

## 2015-12-31 ENCOUNTER — Encounter: Payer: Self-pay | Admitting: Adult Health

## 2015-12-31 ENCOUNTER — Ambulatory Visit (INDEPENDENT_AMBULATORY_CARE_PROVIDER_SITE_OTHER): Payer: 59 | Admitting: Adult Health

## 2015-12-31 VITALS — BP 132/84 | HR 85 | Ht 65.0 in | Wt 201.5 lb

## 2015-12-31 DIAGNOSIS — G35 Multiple sclerosis: Secondary | ICD-10-CM

## 2015-12-31 DIAGNOSIS — G2581 Restless legs syndrome: Secondary | ICD-10-CM | POA: Diagnosis not present

## 2015-12-31 MED ORDER — ROPINIROLE HCL 0.5 MG PO TABS: 0.5000 mg | ORAL_TABLET | Freq: Every day | ORAL | Status: DC

## 2015-12-31 MED FILL — rOPINIRole HCL 0.5 MG TABS: 0.5 | 30 days supply | Qty: 30 | Fill #0

## 2015-12-31 MED FILL — metFORMIN HCL 1000 MG TABS: 1000 | 90 days supply | Qty: 180 | Fill #1

## 2015-12-31 MED FILL — SERTRALINE HCL 50 MG TABLET: 50 | 90 days supply | Qty: 90 | Fill #1

## 2015-12-31 MED FILL — BETASERON 0.3 MG KIT: 0.3 | 28 days supply | Qty: 14 | Fill #6

## 2015-12-31 NOTE — Patient Instructions (Signed)
Requip increase to 0.5 mg daily at bedtime If your symptoms worsen or you develop new symptoms please let us know.  Blood work today

## 2015-12-31 NOTE — Progress Notes (Signed)
PATIENT: Erica Powell DOB: Apr 10, 1966  REASON FOR VISIT: follow up- multiple sclerosis, restless leg syndrome HISTORY FROM: patient  HISTORY OF PRESENT ILLNESS: Mr. Erica Powell is a 50 year old female with a history of multiple sclerosis and restless leg. She returns today for follow-up. The patient reports that she has been stable. She continues on Betaseron. She recently had lab work in November. She denies any trouble with her gait or balance. She states occasionally she will be stiff in the mornings but that resolves. She also states that occasionally she will have some urinary urgency but this is intermittent. Denies any trouble with her vision. However she does state that she has an appointment to have a torn retina repair. The patient is currently taking Requip for restless leg syndrome. She states 0.25 mg at bedtime. She states occasionally she has to take the entire tablet in order to resolve her symptoms. Overall she is doing well. She denies any new neurological symptoms. She returns today for an evaluation.  HISTORY (DOHMEIER): Erica Powell is a 50 y.o. left handed, caucasian female Is seen here as a routine once a year revisit for multiple sclerosis, originally referred from Dr. Laurann Montana.  Miss Erica Powell has been a patient in our neurology clinic for the last 8 years, beginning in 2006.  She was diagnosed with multiple sclerosis and treatment was initiated. She had no recent MS relapses but a brain MRI that had been performed in January 2014 was showing still scattered periventricular, subcortical and pontine T2 hyperintensities consistent with chronic demyelinating plaques of various age.  No acute lesions were seen in her last MRI obtained on 12/29/2012. She had 2 separate studies in 2006 at [facility]. Initally she had C5 and C 6 lesions, these dorsal cervical cord lesions did not span multiple levels. The patient was diagnosed in 2012 with diabetes and has a strong DM family  history. Her last HbA1c just 2 month ago was 7.0 and had been in June 2014 at 8.3. She noticed an improvement in her urinary frequency once her sugars were lower. She also has still no clinical progression or relapse, but she reports problems with concentration, cognition and sometimes with multitasking. She feels that she is easily distracted and loses her train of thought when this happens. She feels especially distracted by noises at her work. 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 There's been no change in her employment, hours of work , hobbies or social life. She has a bachelor's degree ,is single but lives with her fianc. She recently noted a new symptom of tightness in her chest , a paraspinal tenderness and tightness, a confined feeling surrounding the chest , a possible variation of " the MS hug", she has responded well to massage, but the relief is not sustained.    UPDATE 06/13/14 (LL): Since last visit, she has had no issues with her MS. No injection site reactions. Through her therapist it was suggested that she may have paradoxical response to benzos, specifically the Klonopin she was taking, making her irritable, with mood swings, and aggressive. Her job performance was criticized for rudeness. Upon stopping the Klonopin, her moods leveled out and she has returned to "her normal self." She continues to have back pain and muscle cramps, which are intermittent. She has been intolerant to Baclofen, making her heart race and feel jittery; Flexeril and Tizanidine were tried and made her too tired. She has not tried Mirapex for fear of  side effects. She has lost weight, 7 lbs since starting Januvia for DMII, without any change in her routine. She has no new complaints today.  Update 12-18-14(CD) : After Erica Powell was seen in her last visit July of last year she has successfully weaned off Klonopin, prescribed for RLS.  It seems that the  Klonopin caused her to be erratic impulsive and abrupt and she was actually criticized at the job for her rudeness. She's now back to her pleasant self.  She has been taking the same medication for over a decade in terms off her MS control on Betaseron. Since she is my patient she was diagnosed with diabetes and has been taking Glucophage later Januvia was added. She also has been prescribed Ultracet for pain by NP Lam. She needs one refill today and takes an average of 1-2 a week po. Her restless legs are back and are affecting her in daytime, anticipation.  RLS with myoclonic jerks- spinal lesions were searched for but none Found. MRI in Jan 2015 : no new lesions in brain , no new lesions in c spine.   Update from 07-17-15, Erica Powell is here today following her January visit. She presents today for an earlier visits and originally scheduled because she developed restless legs and leg twitching. She also has skin itching which her primary care physician no longer fields can be attributed to diabetes. Her diabetes was very well controlled over the last 6 months.  She scored very low with a ferritin level of only 16 she also had a high total iron binding capacity her iron level was 40 mcg/dL which is on the very lowest of normal and her iron saturation was only 10% below normal. Her last fasting glucose 7 months ago was 105 done in this office. Dr. Laurann Montana may have labs of more recent data. Electrolytes and liver function tests were normal white blood cell and red blood circumference normal there was no evidence of anemia. The differential was intact. Her creatinine is normal, no indication impaired kidney function.she does have very mild proteinuria.   Hydroxazine 25 mg at bedtime helped itching and flushing. I will refill that.   REVIEW OF SYSTEMS: Out of a complete 14 system review of symptoms, the patient complains only of the following symptoms, and all other reviewed systems are  negative.  Trouble swallowing, leg swelling, constipation, heat intolerance, restless leg, shift work, walking difficulty, back pain, joint pain, incontinence of bladder, decreased concentration  ALLERGIES: Allergies  Allergen Reactions  . Cephalosporins Itching and Swelling    Occurs with cephalexin (Keflex). Starts with prickling around the face, swelling/numb feeling of face.  . Hydrochlorothiazide Itching and Swelling    Swelling of the face, hives, itching, feeling of bad sunburn.   . Penicillins Itching and Swelling    Occurs with Augmentin and amoxicillin. Hives, prickling, swelling of face.   . Sulfa Antibiotics Itching and Swelling    Itching, hives, swelling of face  . Baclofen Other (See Comments)    Leg spasms, jittery, heart racing   . Clonazepam     Males me mean    HOME MEDICATIONS: Outpatient Prescriptions Prior to Visit  Medication Sig Dispense Refill  . acetaminophen (TYLENOL) 500 MG tablet Take 2 tablets (1,000 mg total) by mouth every other day. Prior to Betaseron injection.    [name] aspirin 81 MG tablet Take 1 tablet (81 mg total) by mouth daily.    [name] BETASERON 0.3 MG KIT injection INJECT 0.3 MGS INTO  THE SKIN EVERY OTHER DAY 14 each PRN  . cholecalciferol (VITAMIN D) 1000 UNITS tablet Take 1,000 Units by mouth daily.    . ferrous sulfate 325 (65 FE) MG tablet Take 325 mg by mouth daily with breakfast.    . hydrOXYzine (ATARAX/VISTARIL) 10 MG tablet Take 1 tablet (10 mg total) by mouth 3 (three) times daily as needed. (Patient taking differently: Take 10 mg by mouth 3 (three) times daily as needed for itching. ) 90 tablet 1  . lisinopril (PRINIVIL,ZESTRIL) 10 MG tablet Take 1 tablet (10 mg total) by mouth daily.    . magnesium gluconate (MAGONATE) 500 MG tablet Take 500 mg by mouth 2 (two) times daily.    . Melatonin 10 MG TABS Take 10 mg by mouth at bedtime.    . metFORMIN (GLUCOPHAGE) 1000 MG tablet Take 1 tablet (1,000 mg total) by mouth 2 (two) times daily with a  meal.    . rOPINIRole (REQUIP) 0.5 MG tablet Take 1/2 tab at night time , one hour before onset of symptoms. 30 tablet 2  . sertraline (ZOLOFT) 50 MG tablet TAKE 1 TABLET BY MOUTH DAILY. 90 tablet 1  . sitaGLIPtin (JANUVIA) 100 MG tablet Take 1 tablet (100 mg total) by mouth daily. 30 tablet 0  . traMADol-acetaminophen (ULTRACET) 37.5-325 MG tablet TAKE 1-2 TABLETS BY MOUTH EVERY 6 HOURS AS NEEDED FOR MODERATE PAIN 60 tablet 1  . vitamin B-12 (CYANOCOBALAMIN) 1000 MCG tablet Take 1,000 mcg by mouth daily.    [name] atorvastatin (LIPITOR) 10 MG tablet Take 20 mg by mouth daily.     . RESTASIS 0.05 % ophthalmic emulsion Place 1 drop into both eyes daily.   1   No facility-administered medications prior to visit.    PAST MEDICAL HISTORY: Past Medical History  Diagnosis Date  . Multiple sclerosis ([place])   . Diabetes mellitus   . Type 2 diabetes mellitus without (mention of) complications   . Hyperlipemia   . Hypertension   . Anemia     PAST SURGICAL HISTORY: Past Surgical History  Procedure Laterality Date  . Essure tubal ligation    . Bone cyst excision    . Wisdom tooth extraction  1985    FAMILY HISTORY: Family History  Problem Relation Age of Onset  . Stroke Mother     SOCIAL HISTORY: Social History   Social History  . Marital Status: Single    Spouse Name: N/A  . Number of Children: 0  . Years of Education: Bachelor's   Occupational History  . Pharamcy Tech    Social History Main Topics  . Smoking status: Never Smoker   . Smokeless tobacco: Never Used  . Alcohol Use: No  . Drug Use: No  . Sexual Activity: Not on file   Other Topics Concern  . Not on file   Social History Narrative   Patient is single with no children   Patient is left handed   Patient has a Water quality scientist degree   Patient drinks 20 oz daily      PHYSICAL EXAM  Filed Vitals:   12/31/15 1317  BP: 132/84  Pulse: 85  Height: _0  (1.651 m)  Weight: 201 lb 8 oz (91.4 kg)   Body mass  index is 33.53 kg/(m^2).  Generalized: Well developed, in no acute distress   Neurological examination  Mentation: Alert oriented to time, place, history taking. Follows all commands speech and language fluent Cranial nerve II-XII: Pupils were equal round reactive to light.  Extraocular movements were full, visual field were full on confrontational test. Facial sensation and strength were normal. Uvula tongue midline. Head turning and shoulder shrug  were normal and symmetric. Motor: The motor testing reveals 5 over 5 strength of all 4 extremities. Good symmetric motor tone is noted throughout.  Sensory: Sensory testing is intact to soft touch on all 4 extremities. No evidence of extinction is noted.  Coordination: Cerebellar testing reveals good finger-nose-finger and heel-to-shin bilaterally.  Gait and station: Gait is normal. Tandem gait is normal. Romberg is negative. No drift is seen.  Reflexes: Deep tendon reflexes are symmetric and normal bilaterally.   DIAGNOSTIC DATA (LABS, IMAGING, TESTING) - I reviewed patient records, labs, notes, testing and imaging myself where available.  Lab Results  Component Value Date   WBC 7.4 12/18/2014   HGB 12.0 12/18/2014   HCT 35.7 12/18/2014   MCV 83 12/18/2014      Component Value Date/Time   NA 139 12/18/2014 1431   K 4.6 12/18/2014 1431   CL 98 12/18/2014 1431   CO2 25 12/18/2014 1431   GLUCOSE 105* 12/18/2014 1431   BUN 8 12/18/2014 1431   CREATININE 0.56* 12/18/2014 1431   CALCIUM 10.1 12/18/2014 1431   PROT 7.6 12/18/2014 1431   ALBUMIN 4.6 12/18/2014 1431   AST 13 12/18/2014 1431   ALT 17 12/18/2014 1431   ALKPHOS 74 12/18/2014 1431   BILITOT 0.2 12/18/2014 1431   GFRNONAA 111 12/18/2014 1431   GFRAA 128 12/18/2014 1431      ASSESSMENT AND PLAN 50 y.o. year old female  has a past medical history of Multiple sclerosis ([place]); Diabetes mellitus; Type 2 diabetes mellitus without (mention of) complications; Hyperlipemia;  Hypertension; and Anemia. here with:  1. Multiple sclerosis 2. Restless leg syndrome  Overall the patient is doing well. She will continue on Betaseron. She recently had blood work to her primary care. I have asked her to fax this to our office. The patient will increase her Requip to 0.5 mg at bedtime. A new prescription has been sent to her pharmacy. Patient advised that if her symptoms worsen or she develops new symptoms she should let us know. She will follow-up in one year or sooner if needed.     [name], MSN, NP-C 12/31/2015, 1:40 PM [facility] [address] [place], [place] [address] [phone]

## 2016-01-01 NOTE — Progress Notes (Signed)
I agree with the assessment and plan as directed by NP .The patient is known to me .   Emmerich Cryer, MD  

## 2016-01-02 ENCOUNTER — Encounter: Payer: Self-pay | Admitting: Adult Health

## 2016-01-06 MED FILL — LISINOPRIL 10 MG TABLET: 10 | 90 days supply | Qty: 90 | Fill #3

## 2016-01-07 DIAGNOSIS — E113393 Type 2 diabetes mellitus with moderate nonproliferative diabetic retinopathy without macular edema, bilateral: Secondary | ICD-10-CM | POA: Diagnosis not present

## 2016-01-09 NOTE — Telephone Encounter (Signed)
I received the patient's lab work from Dr. Jone Baseman office. Everything was in normal range with the exception of her hemoglobin A1c which was 7.5.

## 2016-01-15 MED FILL — TRAMADOL-APAP 37.5-325 TAB: 37.5-325 | 8 days supply | Qty: 60 | Fill #1

## 2016-01-15 MED FILL — JANUVIA 100 MG TABLET: 100 | 90 days supply | Qty: 90 | Fill #3

## 2016-01-21 DIAGNOSIS — H33321 Round hole, right eye: Secondary | ICD-10-CM | POA: Diagnosis not present

## 2016-01-31 ENCOUNTER — Other Ambulatory Visit: Payer: Self-pay | Admitting: Neurology

## 2016-01-31 MED FILL — BETASERON 0.3 MG KIT: 0.3 | 28 days supply | Qty: 14 | Fill #7

## 2016-01-31 MED FILL — rOPINIRole HCL 0.5 MG TABS: 0.5 | 30 days supply | Qty: 30 | Fill #1

## 2016-02-04 ENCOUNTER — Telehealth: Payer: Self-pay | Admitting: Neurology

## 2016-02-04 ENCOUNTER — Other Ambulatory Visit: Payer: Self-pay | Admitting: Neurology

## 2016-02-04 MED FILL — TRAMADOL-APAP 37.5-325 TAB: 37.5-325 | 8 days supply | Qty: 60 | Fill #0

## 2016-02-04 NOTE — Telephone Encounter (Signed)
I called and left a message on pt's cell phone advising her that her RX should be ready at [facility]. See refill encounter.

## 2016-02-04 NOTE — Telephone Encounter (Signed)
I called to to advise pt that her tramadol RX was faxed to the [facility] pharmacy. I left a detailed message on cell number per DPR.

## 2016-02-04 NOTE — Telephone Encounter (Signed)
Pt called in and said she had a missed call and a messaged was left. I asked the pt if she listened to it and she said no that she couldn't because she was driving. She indicated that it may be about a refill . I looked at her chart and she does have one pending. I told the pt that I could leave a message for the nurse to call her back. The pt stated," I may or may not get it because I'll be asleep. I work third shift and am going home." I told the pt that I did not know who called her or why and again asked her if she wanted me to leave a message. She said " just forget it" and hung up on me.

## 2016-02-11 MED FILL — ATORVASTATIN 20 MG TABLET: 20 | 90 days supply | Qty: 90 | Fill #0

## 2016-02-27 MED FILL — BETASERON 0.3 MG KIT: 0.3 | 28 days supply | Qty: 14 | Fill #8

## 2016-02-28 MED FILL — rOPINIRole HCL 0.5 MG TABS: 0.5 | 30 days supply | Qty: 30 | Fill #2

## 2016-02-28 MED FILL — TRAMADOL-APAP 37.5-325 TAB: 37.5-325 | 8 days supply | Qty: 60 | Fill #1

## 2016-03-10 DIAGNOSIS — I10 Essential (primary) hypertension: Secondary | ICD-10-CM | POA: Diagnosis not present

## 2016-03-10 DIAGNOSIS — Z7984 Long term (current) use of oral hypoglycemic drugs: Secondary | ICD-10-CM | POA: Diagnosis not present

## 2016-03-10 DIAGNOSIS — E119 Type 2 diabetes mellitus without complications: Secondary | ICD-10-CM | POA: Diagnosis not present

## 2016-03-25 ENCOUNTER — Other Ambulatory Visit: Payer: Self-pay | Admitting: Neurology

## 2016-03-25 MED FILL — SERTRALINE HCL 50 MG TABLET: 50 | 90 days supply | Qty: 90 | Fill #0

## 2016-03-25 MED FILL — BETASERON 0.3 MG KIT: 0.3 | 28 days supply | Qty: 14 | Fill #9

## 2016-03-25 MED FILL — rOPINIRole HCL 0.5 MG TABS: 0.5 | 30 days supply | Qty: 30 | Fill #3

## 2016-03-25 NOTE — Telephone Encounter (Signed)
Do you want to continue prescribing zoloft 50 mg daily for this pt? It appears that pt has been on this medication for many years being prescribed by Dr. Vickey Huger but I cannot find anything in the office visit notes to justify a continuation/ why the pt is on zoloft.  Dr. Vickey Huger is also prescribing tramadol which when taken with zoloft can cause serotonin syndrome.

## 2016-03-25 NOTE — Telephone Encounter (Signed)
Will refill.

## 2016-04-01 DIAGNOSIS — H52223 Regular astigmatism, bilateral: Secondary | ICD-10-CM | POA: Diagnosis not present

## 2016-04-01 DIAGNOSIS — H3582 Retinal ischemia: Secondary | ICD-10-CM | POA: Diagnosis not present

## 2016-04-01 DIAGNOSIS — E113293 Type 2 diabetes mellitus with mild nonproliferative diabetic retinopathy without macular edema, bilateral: Secondary | ICD-10-CM | POA: Diagnosis not present

## 2016-04-01 DIAGNOSIS — H5213 Myopia, bilateral: Secondary | ICD-10-CM | POA: Diagnosis not present

## 2016-04-01 DIAGNOSIS — H524 Presbyopia: Secondary | ICD-10-CM | POA: Diagnosis not present

## 2016-04-02 MED FILL — TRULICITY 0.75 MG/0.5 ML PE: 0.75 | 30 days supply | Qty: 2 | Fill #0

## 2016-04-07 ENCOUNTER — Other Ambulatory Visit: Payer: Self-pay | Admitting: Neurology

## 2016-04-07 MED FILL — TRAMADOL-APAP 37.5-325 TAB: 37.5-325 | 8 days supply | Qty: 60 | Fill #0

## 2016-04-07 NOTE — Telephone Encounter (Signed)
Faxed RX for tramadol to [facility] pharmacy. Received a receipt of confirmation.

## 2016-04-10 MED FILL — LISINOPRIL 10 MG TABLET: 10 | 90 days supply | Qty: 90 | Fill #0

## 2016-04-10 MED FILL — metFORMIN HCL 1000 MG TABS: 1000 | 90 days supply | Qty: 180 | Fill #2

## 2016-04-22 MED FILL — BETASERON 0.3 MG KIT: 0.3 | 28 days supply | Qty: 14 | Fill #10

## 2016-04-22 MED FILL — rOPINIRole HCL 0.5 MG TABS: 0.5 | 30 days supply | Qty: 30 | Fill #4

## 2016-04-28 ENCOUNTER — Ambulatory Visit (HOSPITAL_BASED_OUTPATIENT_CLINIC_OR_DEPARTMENT_OTHER): Payer: 59 | Admitting: Pharmacist

## 2016-04-28 DIAGNOSIS — G35 Multiple sclerosis: Secondary | ICD-10-CM

## 2016-04-28 MED ORDER — BETASERON 0.3 MG ~~LOC~~ KIT: PACK | SUBCUTANEOUS | Status: DC

## 2016-04-28 NOTE — Progress Notes (Addendum)
S: Patient presents today to the [facility].  Patient is currently taking Betaseron for MS. Patient is managed by Dr. Vickey Huger for this. She has not had any relapsing in a while and is very happy with this medication.   Adherence: denies any missed doses.  Dosing:  0.3 mg every other day. Next dose is tomorrow 04/29/16.  Drug-drug interactions:none  Monitoring: CBC: WNL (done by primary care - not in Epic) LFTs: WNL (done by primary care - not in Epic) Thyroid function tests: WNL Flu-like symptoms: denies and takes pre-treatment acetaminophen to prevent  Neuropsychiatric symptoms: denies Injection-site reactions: yes but manages appropriately  O:     Lab Results  Component Value Date   WBC 7.4 12/18/2014   HGB 12.0 12/18/2014   HCT 35.7 12/18/2014   MCV 83 12/18/2014      Chemistry      Component Value Date/Time   NA 139 12/18/2014 1431   K 4.6 12/18/2014 1431   CL 98 12/18/2014 1431   CO2 25 12/18/2014 1431   BUN 8 12/18/2014 1431   CREATININE 0.56* 12/18/2014 1431      Component Value Date/Time   CALCIUM 10.1 12/18/2014 1431   ALKPHOS 74 12/18/2014 1431   AST 13 12/18/2014 1431   ALT 17 12/18/2014 1431   BILITOT 0.2 12/18/2014 1431       A/P: 1. Medication review: Patient is tolerating Betaseron well and denies any adverse effects besides injection-site reactions. Reviewed the medication with the patient. Betaseron, interferon beta-1b, is an interferon indicated for the treatment of MS. Analgesics and/or antipyretics may help decrease flu-like symptoms on treatment days and patient is doing this. Other adverse effects of this medication are bone marrow suppression, injection-site reactions, and neuropsychiatric disorders. No s/sx of this. It can increase the risk of new onset cardiomyopathy or heart failure. No s/sx of this. No recommendations for any changes. Patient to continue to follow with Dr. Vickey Huger.     Juanita Craver, PharmD, BCPS, CPP Clinical Pharmacist Practitioner  [facility] [phone]  Evaluation and management procedures were performed by the Advanced Practitioner (CPP) under my supervision and collaboration. I have reviewed the CPP's note and chart, and I agree with the management and plan.   Jeanann Lewandowsky, MD, MHA, CPE, FACP, FAAP [facility] [place], [place] [phone]   04/28/2016, 6:18 PM

## 2016-05-07 MED FILL — TRULICITY 0.75 MG/0.5 ML PE: 0.75 | 30 days supply | Qty: 2 | Fill #1

## 2016-05-08 MED FILL — ATORVASTATIN 20 MG TABLET: 20 | 90 days supply | Qty: 90 | Fill #1

## 2016-05-11 MED FILL — TRAMADOL-APAP 37.5-325 TAB: 37.5-325 | 8 days supply | Qty: 60 | Fill #1

## 2016-05-21 MED FILL — BETASERON 0.3 MG KIT: 0.3 | 28 days supply | Qty: 14 | Fill #0

## 2016-05-21 MED FILL — rOPINIRole HCL 0.5 MG TABS: 0.5 | 30 days supply | Qty: 30 | Fill #5

## 2016-06-04 MED FILL — TRULICITY 0.75 MG/0.5 ML PE: 0.75 | 30 days supply | Qty: 2 | Fill #2

## 2016-06-17 ENCOUNTER — Other Ambulatory Visit: Payer: Self-pay | Admitting: Neurology

## 2016-06-17 MED FILL — SERTRALINE HCL 50 MG TABLET: 50 | 90 days supply | Qty: 90 | Fill #1

## 2016-06-17 MED FILL — BETASERON 0.3 MG KIT: 0.3 | 28 days supply | Qty: 14 | Fill #1

## 2016-06-18 ENCOUNTER — Other Ambulatory Visit: Payer: Self-pay | Admitting: *Deleted

## 2016-06-18 MED ORDER — ROPINIROLE HCL 0.5 MG PO TABS: 0.5000 mg | ORAL_TABLET | Freq: Every day | ORAL | Status: DC

## 2016-06-18 MED FILL — rOPINIRole HCL 0.5 MG TABS: 0.5 | 30 days supply | Qty: 30 | Fill #0

## 2016-06-18 MED FILL — TRAMADOL-APAP 37.5-325 TAB: 37.5-325 | 8 days supply | Qty: 60 | Fill #0

## 2016-06-18 NOTE — Telephone Encounter (Signed)
RX for tramadol faxed to [facility] pharmacy. Received a receipt of confirmation.

## 2016-07-01 MED FILL — TRULICITY 0.75 MG/0.5 ML PE: 0.75 | 30 days supply | Qty: 2 | Fill #0

## 2016-07-02 ENCOUNTER — Other Ambulatory Visit: Payer: Self-pay

## 2016-07-02 MED ORDER — HYDROXYZINE HCL 10 MG PO TABS: 10.0000 mg | ORAL_TABLET | Freq: Three times a day (TID) | ORAL | 1 refills | Status: DC | PRN

## 2016-07-02 MED FILL — hydrOXYzine HCL 10 MG TABS: 10 | 30 days supply | Qty: 90 | Fill #0

## 2016-07-02 MED FILL — metFORMIN HCL 1000 MG TABS: 1000 | 90 days supply | Qty: 180 | Fill #3

## 2016-07-15 MED FILL — rOPINIRole HCL 0.5 MG TABS: 0.5 | 30 days supply | Qty: 30 | Fill #1

## 2016-07-15 MED FILL — LISINOPRIL 10 MG TABLET: 10 | 90 days supply | Qty: 90 | Fill #1

## 2016-07-15 MED FILL — TRUE METRIX GLUCOSE TEST ST: 90 days supply | Qty: 200 | Fill #1

## 2016-07-15 MED FILL — BETASERON 0.3 MG KIT: 0.3 | 28 days supply | Qty: 14 | Fill #2

## 2016-07-22 MED FILL — TRAMADOL-APAP 37.5-325 TAB: 37.5-325 | 8 days supply | Qty: 60 | Fill #1

## 2016-07-23 DIAGNOSIS — Z7984 Long term (current) use of oral hypoglycemic drugs: Secondary | ICD-10-CM | POA: Diagnosis not present

## 2016-07-23 DIAGNOSIS — E1121 Type 2 diabetes mellitus with diabetic nephropathy: Secondary | ICD-10-CM | POA: Diagnosis not present

## 2016-07-23 DIAGNOSIS — E7800 Pure hypercholesterolemia, unspecified: Secondary | ICD-10-CM | POA: Diagnosis not present

## 2016-07-23 DIAGNOSIS — Z0000 Encounter for general adult medical examination without abnormal findings: Secondary | ICD-10-CM | POA: Diagnosis not present

## 2016-07-23 DIAGNOSIS — N182 Chronic kidney disease, stage 2 (mild): Secondary | ICD-10-CM | POA: Diagnosis not present

## 2016-07-23 DIAGNOSIS — R3915 Urgency of urination: Secondary | ICD-10-CM | POA: Diagnosis not present

## 2016-07-23 DIAGNOSIS — I10 Essential (primary) hypertension: Secondary | ICD-10-CM | POA: Diagnosis not present

## 2016-08-03 MED FILL — TRULICITY 0.75 MG/0.5 ML PE: 0.75 | 30 days supply | Qty: 2 | Fill #1

## 2016-08-06 DIAGNOSIS — Z1231 Encounter for screening mammogram for malignant neoplasm of breast: Secondary | ICD-10-CM | POA: Diagnosis not present

## 2016-08-06 DIAGNOSIS — Z01419 Encounter for gynecological examination (general) (routine) without abnormal findings: Secondary | ICD-10-CM | POA: Diagnosis not present

## 2016-08-06 DIAGNOSIS — Z6832 Body mass index (BMI) 32.0-32.9, adult: Secondary | ICD-10-CM | POA: Diagnosis not present

## 2016-08-11 MED FILL — rOPINIRole HCL 0.5 MG TABS: 0.5 | 30 days supply | Qty: 30 | Fill #2

## 2016-08-11 MED FILL — ATORVASTATIN 20 MG TABLET: 20 | 90 days supply | Qty: 90 | Fill #2

## 2016-08-12 MED FILL — BETASERON 0.3 MG KIT: 0.3 | 28 days supply | Qty: 14 | Fill #3

## 2016-08-17 ENCOUNTER — Other Ambulatory Visit: Payer: Self-pay | Admitting: Neurology

## 2016-08-17 NOTE — Telephone Encounter (Signed)
RX for tramadol faxed to [facility] pharmacy. Received a receipt of confirmation.

## 2016-08-18 MED FILL — TRAMADOL-APAP 37.5-325 TAB: 37.5-325 | 8 days supply | Qty: 60 | Fill #0

## 2016-08-31 IMAGING — MG MM DIAG BREAST TOMO UNI RIGHT
4 series · 4 of 12 positions shown · non-contrast
Comparison: Previous exam(s).

CLINICAL DATA: Screening recall for a right breast mass.

EXAM:
DIGITAL DIAGNOSTIC RIGHT MAMMOGRAM WITH 3D TOMOSYNTHESIS WITH CAD
ULTRASOUND RIGHT BREAST

[R CC]
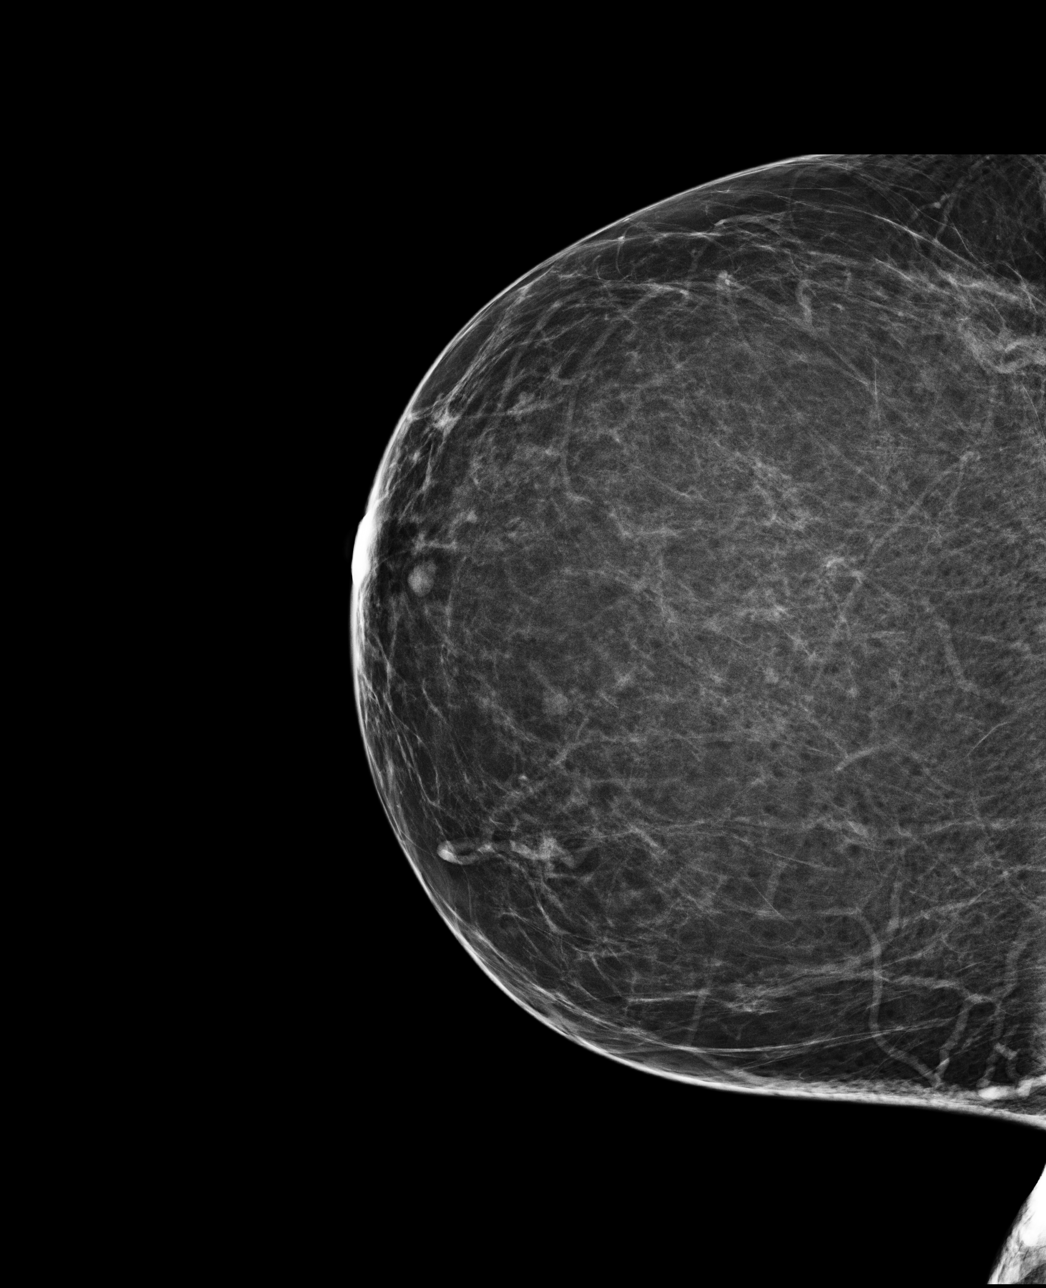

[R MLO]
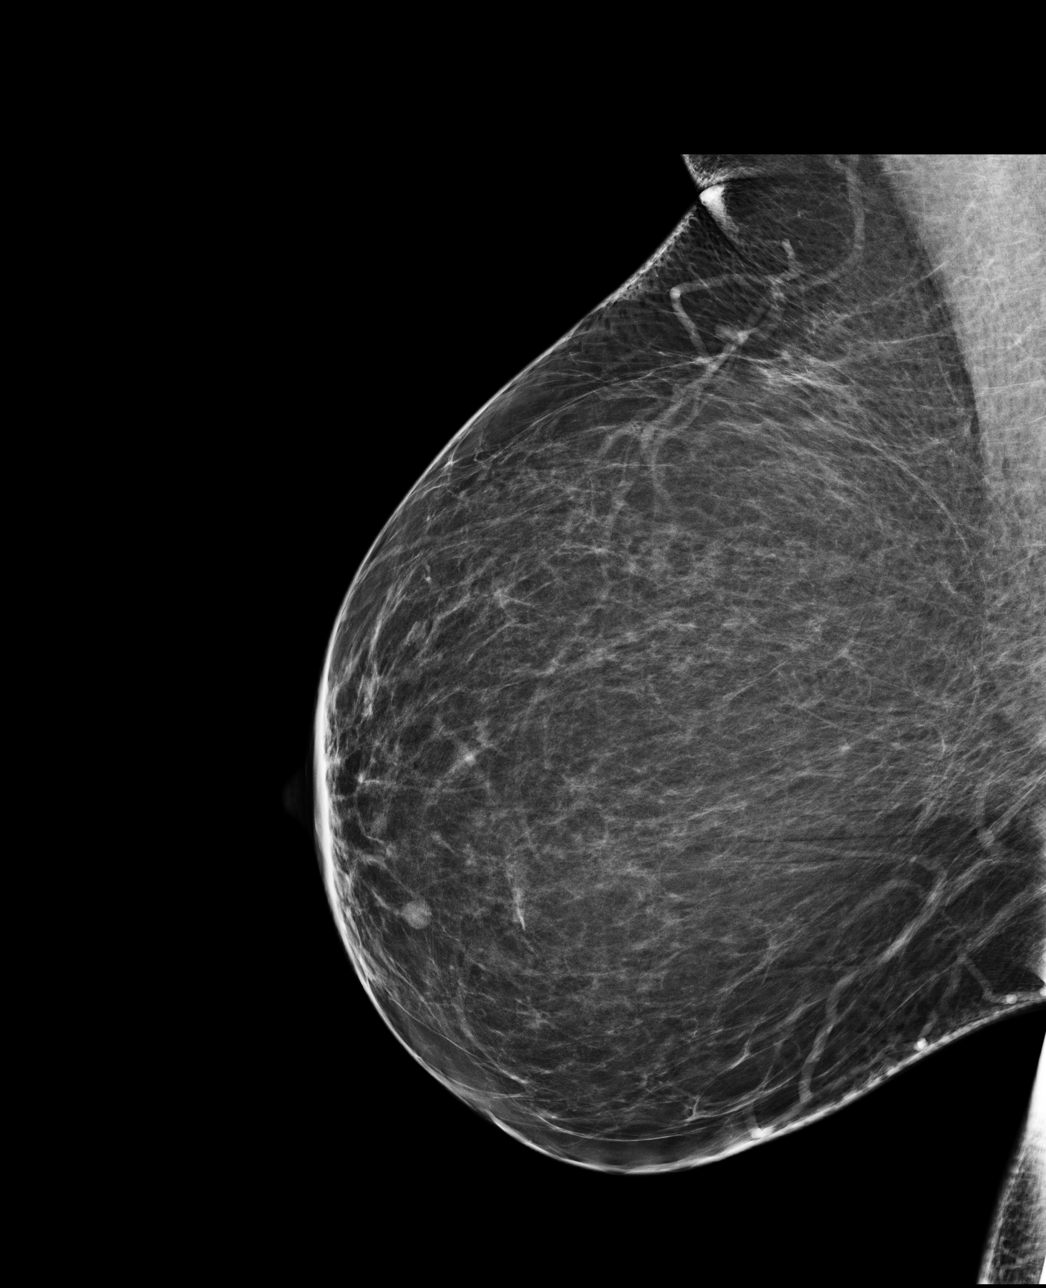

[R CC tomo · tomo slice 33/65.0]
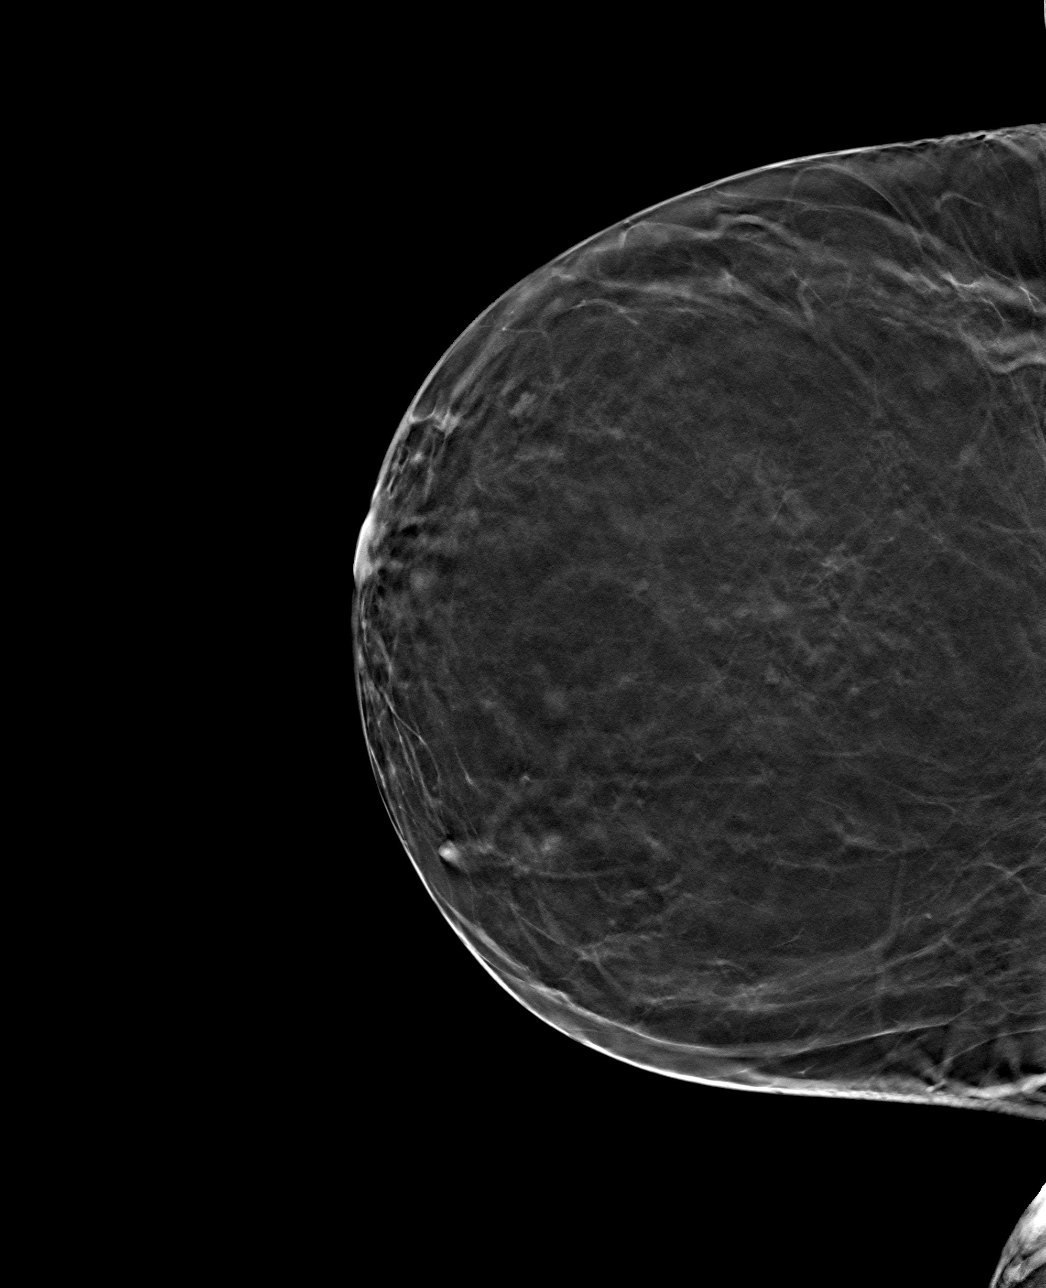

[R MLO tomo · tomo slice 40/79.0]
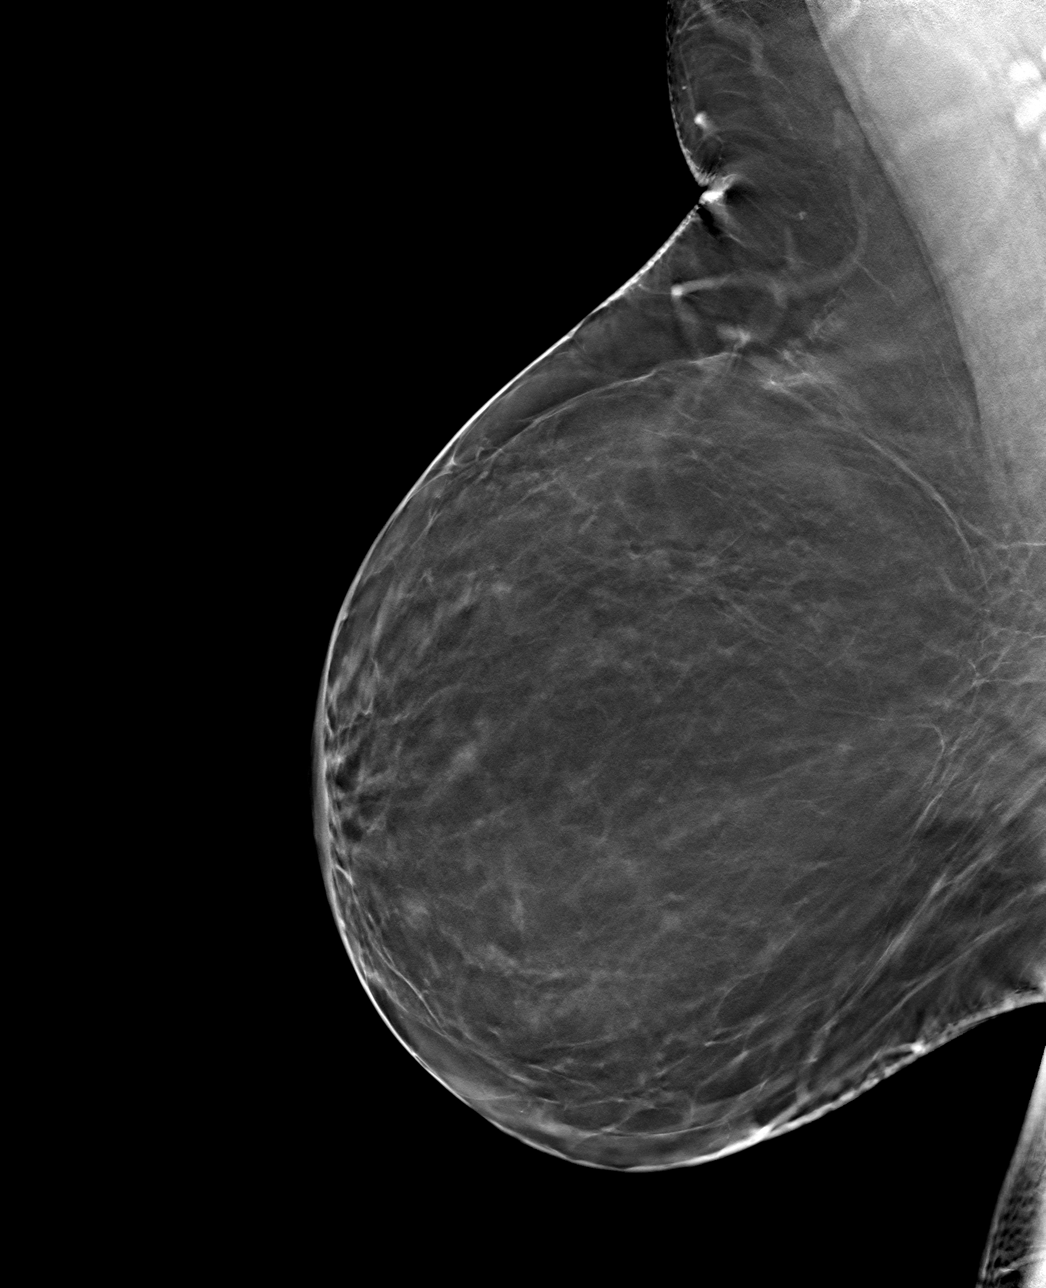

[4 of 12 positions shown; findings below may reference images not displayed]

ACR Breast Density Category c: The breast tissue is heterogeneously
dense, which may obscure small masses.
FINDINGS: Cc and MLO tomosynthesis was performed of the right breast
demonstrating an oval circumscribed mass in the subareolar to
slightly lower right breast measuring approximately 5-6 mm.

Mammographic images were processed with CAD.

Physical examination of the subareolar and slightly lower right
breast is not reveal any palpable masses.

Targeted ultrasound of the right breast was performed demonstrating
an oval well-circumscribed anechoic mass at 6 o'clock 1 cm from the
nipple compatible with a cyst measuring 0.6 x 0.2 x 0.6 cm. This
corresponds with mammography findings.
IMPRESSION: Right breast cyst. There is no mammographic evidence of malignancy
in the right breast.

RECOMMENDATION:
Screening mammogram in one year.(Code:X8-1-ZDK)

I have discussed the findings and recommendations with the patient.
Results were also provided in writing at the conclusion of the
visit. If applicable, a reminder letter will be sent to the patient
regarding the next appointment.

BI-RADS CATEGORY  2: Benign.

## 2016-08-31 IMAGING — US US BREAST LTD UNI RIGHT INC AXILLA
1 series · 7 of 7 positions shown · non-contrast
Comparison: Previous exam(s).

CLINICAL DATA: Screening recall for a right breast mass.

EXAM:
DIGITAL DIAGNOSTIC RIGHT MAMMOGRAM WITH 3D TOMOSYNTHESIS WITH CAD
ULTRASOUND RIGHT BREAST

[Series 1: us breast ltd uni right inc axilla · 0.07mm/px · 7 of 7 slices shown]
[im 1/7]
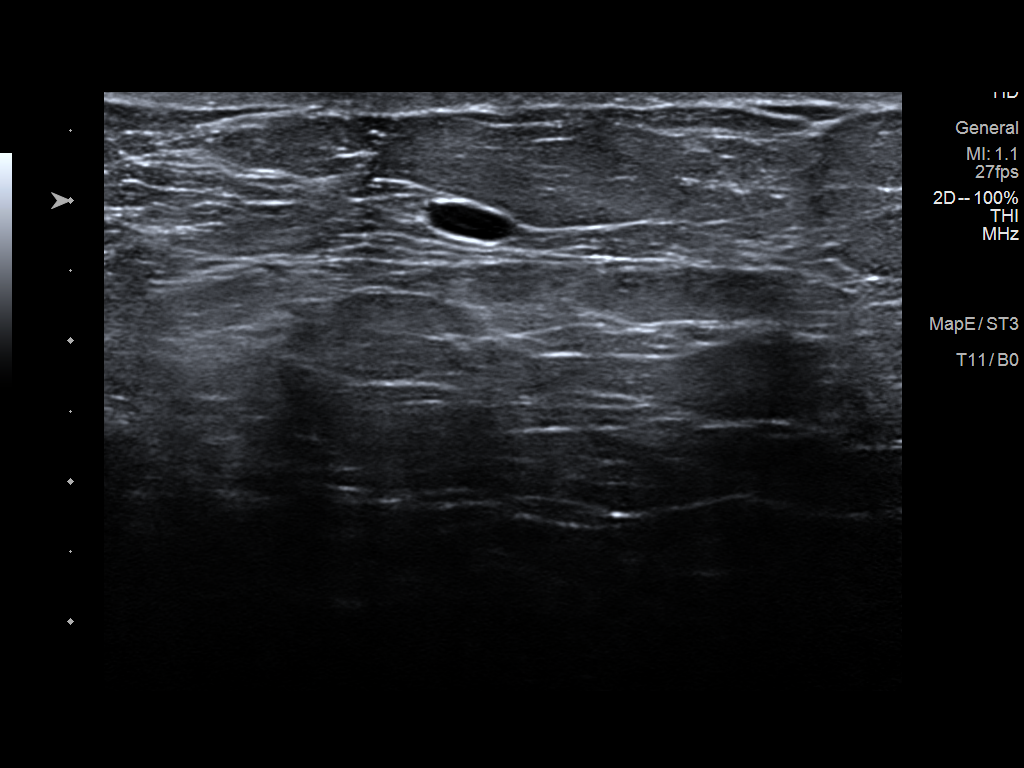
[im 2/7]
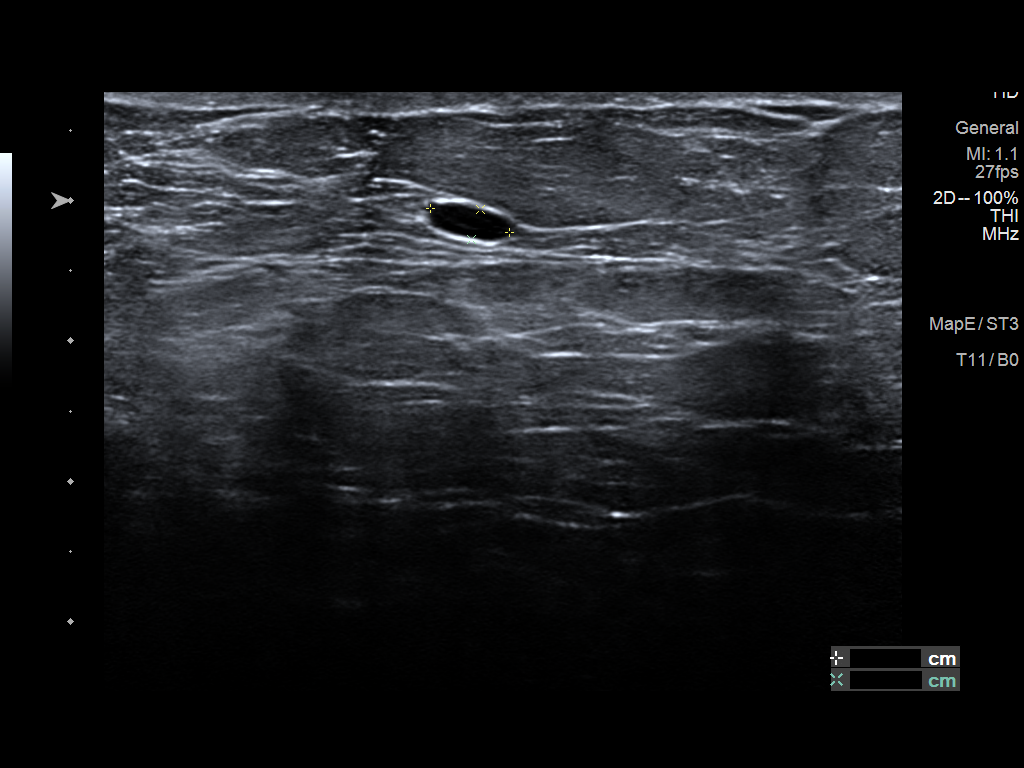
[im 3/7]
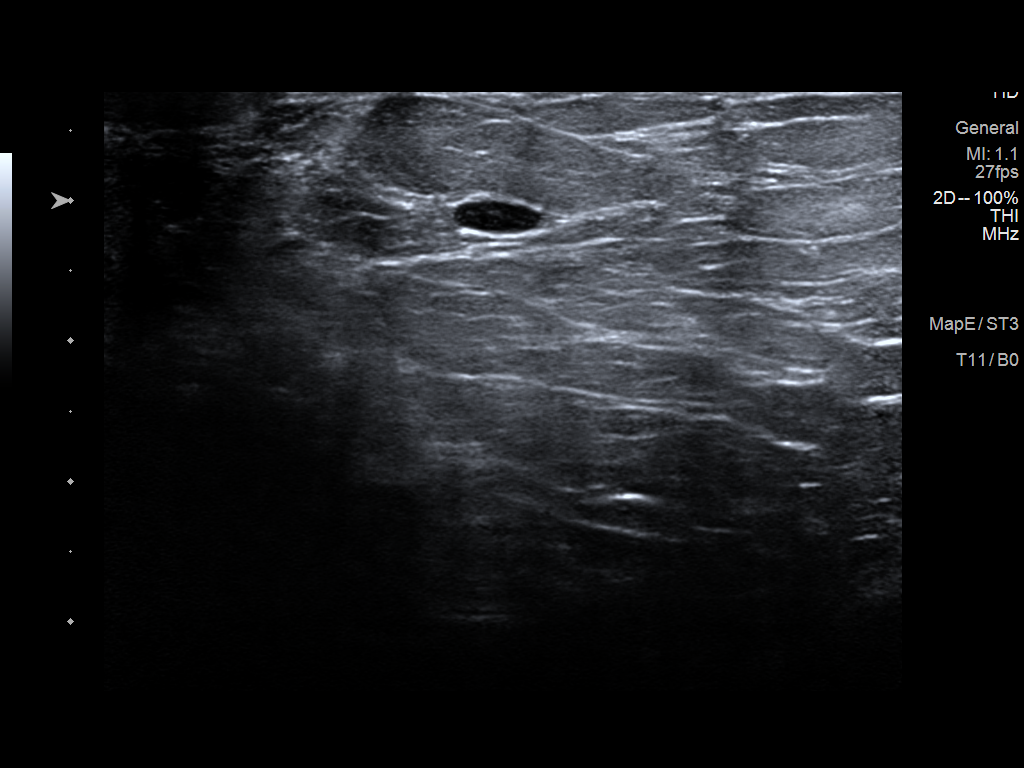
[im 4/7]
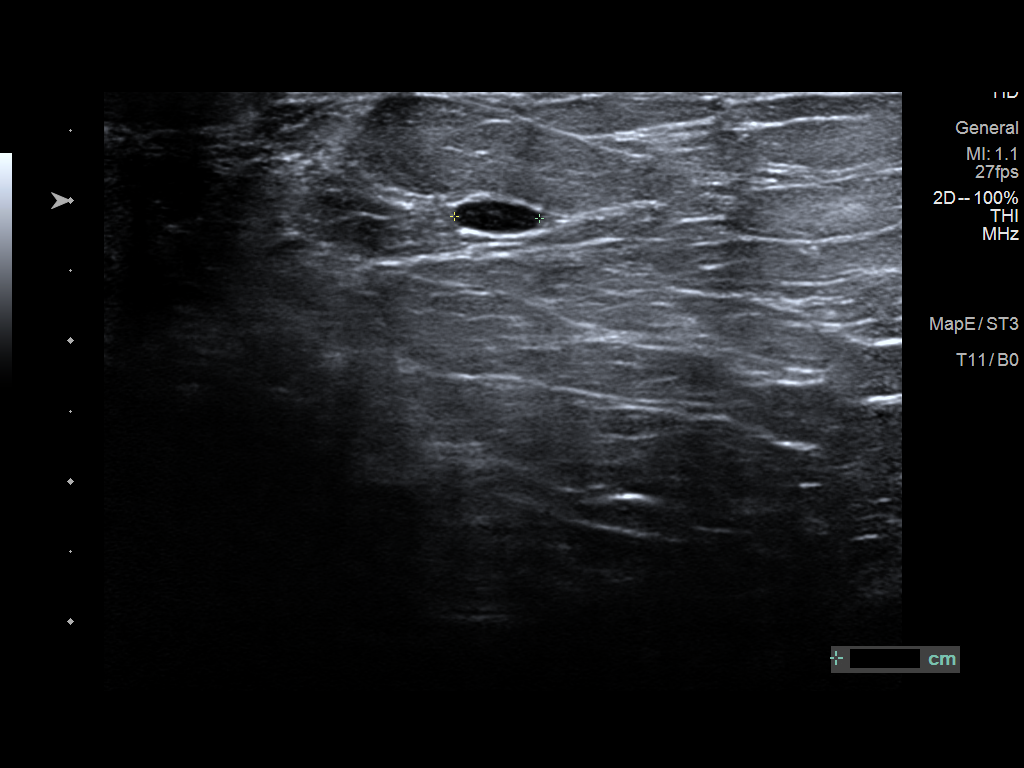
[im 5/7]
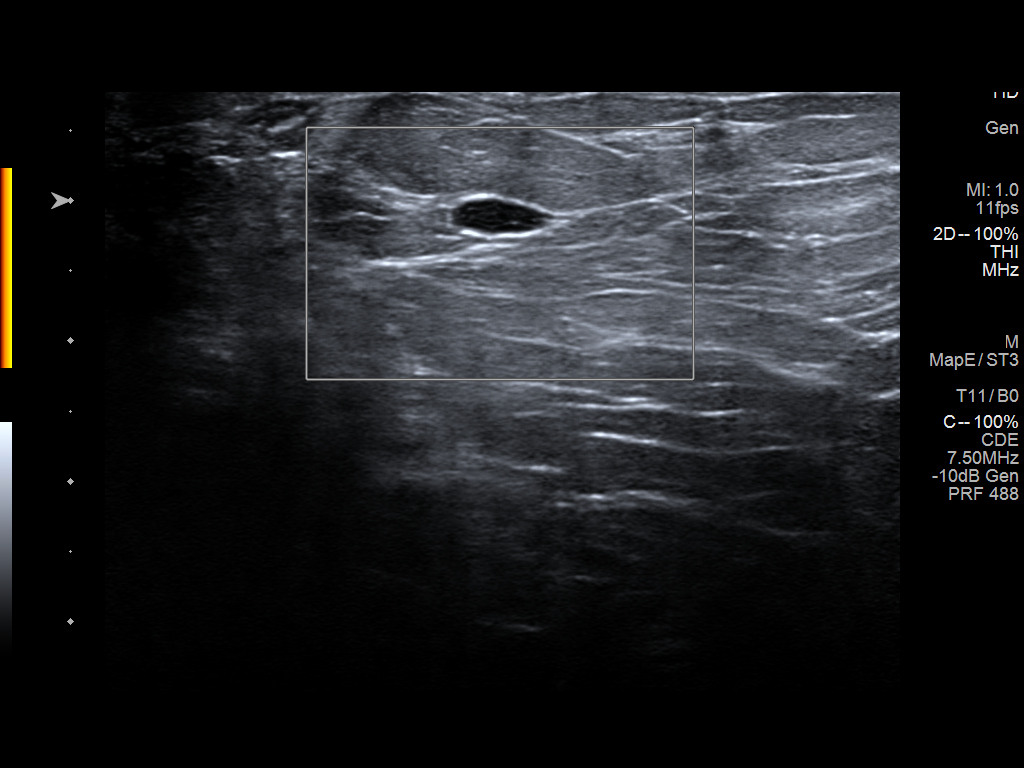
[im 6/7]
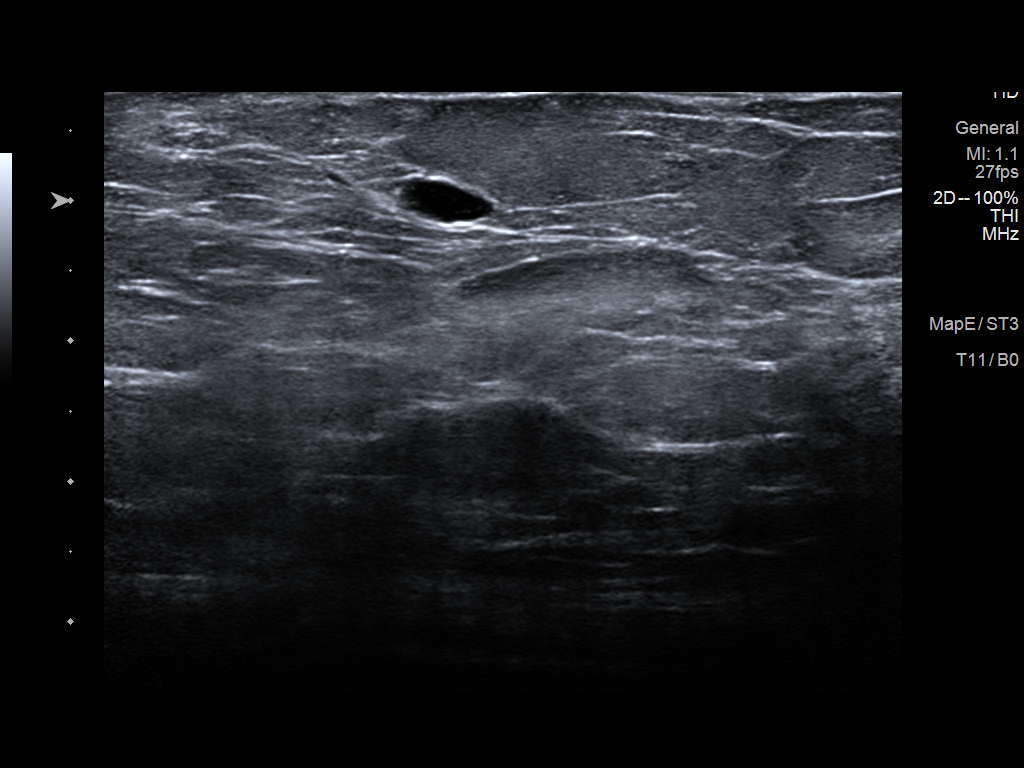
[im 7/7]
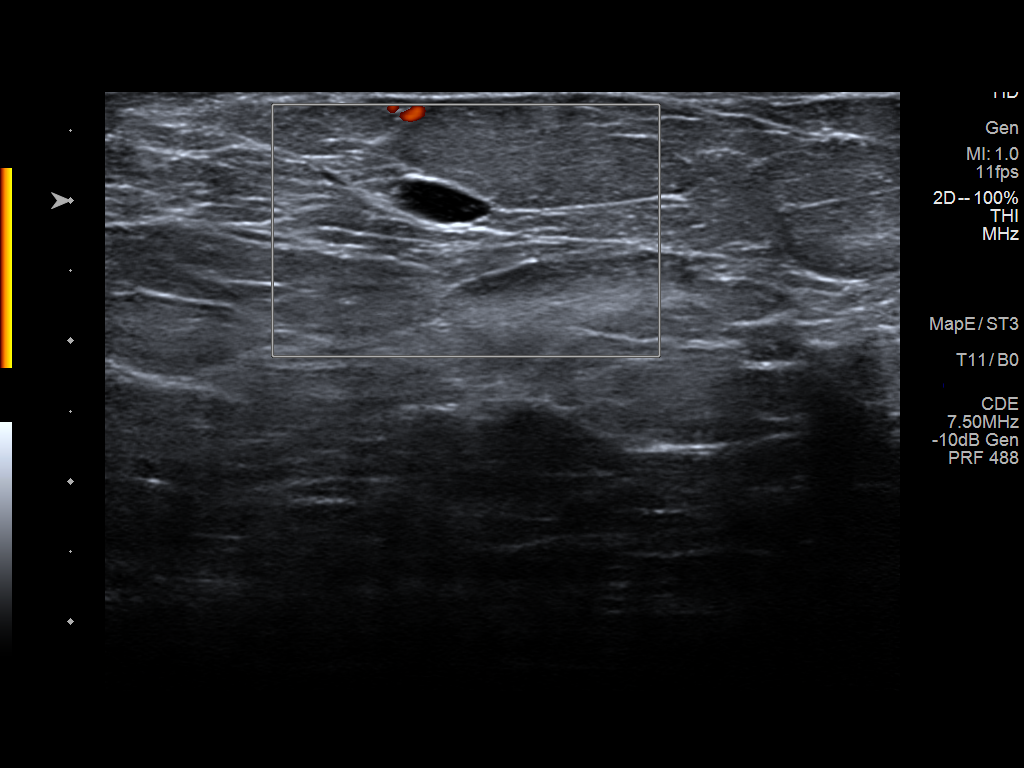

[7 of 7 positions shown; findings below may reference images not displayed]

ACR Breast Density Category c: The breast tissue is heterogeneously
dense, which may obscure small masses.
FINDINGS: Cc and MLO tomosynthesis was performed of the right breast
demonstrating an oval circumscribed mass in the subareolar to
slightly lower right breast measuring approximately 5-6 mm.

Mammographic images were processed with CAD.

Physical examination of the subareolar and slightly lower right
breast is not reveal any palpable masses.

Targeted ultrasound of the right breast was performed demonstrating
an oval well-circumscribed anechoic mass at 6 o'clock 1 cm from the
nipple compatible with a cyst measuring 0.6 x 0.2 x 0.6 cm. This
corresponds with mammography findings.
IMPRESSION: Right breast cyst. There is no mammographic evidence of malignancy
in the right breast.

RECOMMENDATION:
Screening mammogram in one year.(Code:X8-1-ZDK)

I have discussed the findings and recommendations with the patient.
Results were also provided in writing at the conclusion of the
visit. If applicable, a reminder letter will be sent to the patient
regarding the next appointment.

BI-RADS CATEGORY  2: Benign.

## 2016-08-31 MED FILL — TRULICITY 0.75 MG/0.5 ML PE: 0.75 | 28 days supply | Qty: 2 | Fill #2

## 2016-09-09 MED FILL — BETASERON 0.3 MG KIT: 0.3 | 28 days supply | Qty: 14 | Fill #4

## 2016-09-10 ENCOUNTER — Other Ambulatory Visit: Payer: Self-pay | Admitting: Neurology

## 2016-09-10 MED FILL — TRAMADOL-APAP 37.5-325 TAB: 37.5-325 | 8 days supply | Qty: 60 | Fill #1

## 2016-09-10 MED FILL — rOPINIRole HCL 0.5 MG TABS: 0.5 | 30 days supply | Qty: 30 | Fill #3

## 2016-09-10 MED FILL — SERTRALINE HCL 50 MG TABLET: 50 | 90 days supply | Qty: 90 | Fill #0

## 2016-09-15 ENCOUNTER — Other Ambulatory Visit: Payer: Self-pay | Admitting: Pharmacist

## 2016-09-15 ENCOUNTER — Other Ambulatory Visit: Payer: Self-pay

## 2016-09-15 MED ORDER — BETASERON 0.3 MG ~~LOC~~ KIT: PACK | SUBCUTANEOUS | 4 refills | Status: DC

## 2016-10-06 ENCOUNTER — Other Ambulatory Visit: Payer: Self-pay | Admitting: Neurology

## 2016-10-06 MED FILL — TRULICITY 0.75 MG/0.5 ML PE: 0.75 | 28 days supply | Qty: 2 | Fill #3

## 2016-10-06 MED FILL — rOPINIRole HCL 0.5 MG TABS: 0.5 | 30 days supply | Qty: 30 | Fill #4

## 2016-10-06 MED FILL — metFORMIN HCL 1000 MG TABS: 1000 | 90 days supply | Qty: 180 | Fill #0

## 2016-10-06 MED FILL — LISINOPRIL 10 MG TABLET: 10 | 90 days supply | Qty: 90 | Fill #2

## 2016-10-06 MED FILL — TRAMADOL-APAP 37.5-325 TAB: 37.5-325 | 8 days supply | Qty: 60 | Fill #0

## 2016-10-06 NOTE — Telephone Encounter (Signed)
RX for tramadol faxed to [facility]. Received a receipt of confirmation.

## 2016-10-08 MED FILL — BETASERON 0.3 MG KIT: 0.3 | 28 days supply | Qty: 14 | Fill #0

## 2016-10-12 MED FILL — DENTA 5000 PLUS CREAM: 1.1 | 30 days supply | Qty: 51 | Fill #0

## 2016-10-14 DIAGNOSIS — H40013 Open angle with borderline findings, low risk, bilateral: Secondary | ICD-10-CM | POA: Diagnosis not present

## 2016-10-14 DIAGNOSIS — H3582 Retinal ischemia: Secondary | ICD-10-CM | POA: Diagnosis not present

## 2016-10-14 DIAGNOSIS — E113293 Type 2 diabetes mellitus with mild nonproliferative diabetic retinopathy without macular edema, bilateral: Secondary | ICD-10-CM | POA: Diagnosis not present

## 2016-10-14 DIAGNOSIS — H3562 Retinal hemorrhage, left eye: Secondary | ICD-10-CM | POA: Diagnosis not present

## 2016-11-03 MED FILL — BETASERON 0.3 MG KIT: 0.3 | 28 days supply | Qty: 14 | Fill #1

## 2016-11-03 MED FILL — rOPINIRole HCL 0.5 MG TABS: 0.5 | 30 days supply | Qty: 30 | Fill #5

## 2016-11-03 MED FILL — ATORVASTATIN 20 MG TABLET: 20 | 90 days supply | Qty: 90 | Fill #3

## 2016-11-03 MED FILL — TRULICITY 0.75 MG/0.5 ML PE: 0.75 | 28 days supply | Qty: 2 | Fill #4

## 2016-11-03 MED FILL — TRAMADOL-APAP 37.5-325 TAB: 37.5-325 | 8 days supply | Qty: 60 | Fill #1

## 2016-11-06 MED FILL — DENTA 5000 PLUS CREAM: 1.1 | 30 days supply | Qty: 51 | Fill #1

## 2016-12-01 ENCOUNTER — Other Ambulatory Visit: Payer: Self-pay | Admitting: Adult Health

## 2016-12-01 MED FILL — TRULICITY 0.75 MG/0.5 ML PE: 0.75 | 28 days supply | Qty: 2 | Fill #5

## 2016-12-01 MED FILL — BETASERON 0.3 MG KIT: 0.3 | 28 days supply | Qty: 14 | Fill #2

## 2016-12-01 MED FILL — SERTRALINE HCL 50 MG TABLET: 50 | 90 days supply | Qty: 90 | Fill #1

## 2016-12-02 MED FILL — rOPINIRole HCL 0.5 MG TABS: 0.5 | 30 days supply | Qty: 30 | Fill #0

## 2016-12-03 MED FILL — FLUOCINONIDE 0.05% CREAM: 0.05 | 30 days supply | Qty: 60 | Fill #1

## 2016-12-18 ENCOUNTER — Other Ambulatory Visit: Payer: Self-pay | Admitting: Neurology

## 2016-12-22 MED FILL — TRAMADOL-APAP 37.5-325 TAB: 37.5-325 | 8 days supply | Qty: 60 | Fill #0

## 2016-12-22 NOTE — Telephone Encounter (Signed)
RX for tramadol faxed to [facility] pharmacy. Received a receipt of confirmation.

## 2016-12-23 ENCOUNTER — Encounter: Payer: Self-pay | Admitting: Adult Health

## 2016-12-28 MED ORDER — ROPINIROLE HCL 0.5 MG PO TABS: 1.0000 mg | ORAL_TABLET | Freq: Every day | ORAL | 5 refills | Status: DC

## 2016-12-28 MED FILL — rOPINIRole HCL 0.5 MG TABS: 0.5 | 30 days supply | Qty: 60 | Fill #0

## 2016-12-29 ENCOUNTER — Encounter: Payer: Self-pay | Admitting: Adult Health

## 2016-12-29 ENCOUNTER — Ambulatory Visit (INDEPENDENT_AMBULATORY_CARE_PROVIDER_SITE_OTHER): Payer: 59 | Admitting: Adult Health

## 2016-12-29 VITALS — BP 144/80 | HR 76 | Ht 65.0 in | Wt 196.8 lb

## 2016-12-29 DIAGNOSIS — G35 Multiple sclerosis: Secondary | ICD-10-CM | POA: Diagnosis not present

## 2016-12-29 DIAGNOSIS — G2581 Restless legs syndrome: Secondary | ICD-10-CM

## 2016-12-29 DIAGNOSIS — Z5181 Encounter for therapeutic drug level monitoring: Secondary | ICD-10-CM | POA: Diagnosis not present

## 2016-12-29 DIAGNOSIS — Z8639 Personal history of other endocrine, nutritional and metabolic disease: Secondary | ICD-10-CM

## 2016-12-29 MED FILL — BETASERON 0.3 MG KIT: 0.3 | 28 days supply | Qty: 14 | Fill #3

## 2016-12-29 MED FILL — metFORMIN HCL 1000 MG TABS: 1000 | 90 days supply | Qty: 180 | Fill #1

## 2016-12-29 MED FILL — TRULICITY 0.75 MG/0.5 ML PE: 0.75 | 28 days supply | Qty: 2 | Fill #6

## 2016-12-29 MED FILL — LISINOPRIL 10 MG TABLET: 10 | 90 days supply | Qty: 90 | Fill #3

## 2016-12-29 MED FILL — hydrOXYzine HCL 10 MG TABS: 10 | 30 days supply | Qty: 90 | Fill #1

## 2016-12-29 NOTE — Progress Notes (Signed)
I agree with the assessment and plan as directed by NP .The patient is known to me .   Izmael Duross, MD  

## 2016-12-29 NOTE — Patient Instructions (Signed)
Continue Requip 1 mg at bedtime Blood work today If your symptoms worsen or you develop new symptoms please let us know.

## 2016-12-29 NOTE — Progress Notes (Signed)
PATIENT: Erica Powell DOB: 1966/08/29  REASON FOR VISIT: follow up HISTORY FROM: patient  HISTORY OF PRESENT ILLNESS: Erica Powell is a 51 year old female with a history of multiple sclerosis and restless leg. She returns today for follow-up. Patient reports that she's had an increase in restless leg symptoms. Usually worse at bedtime.. She has increased her Requip taking it in the morning and at night. She states that this has offered her some benefit. In the past she has been diagnosed with iron deficiency and was given IV iron. She is not had any additional blood work. She continues on Betaseron. She is tolerating this well. Denies any new neurological symptoms. Overall she feels that she is doing well. She returns today for an evaluation.    HISTORY 12/31/15: Erica Powell is a 51 year old female with a history of multiple sclerosis and restless leg. She returns today for follow-up. The patient reports that she has been stable. She continues on Betaseron. She recently had lab work in November. She denies any trouble with her gait or balance. She states occasionally she will be stiff in the mornings but that resolves. She also states that occasionally she will have some urinary urgency but this is intermittent. Denies any trouble with her vision. However she does state that she has an appointment to have a torn retina repair. The patient is currently taking Requip for restless leg syndrome. She states 0.25 mg at bedtime. She states occasionally she has to take the entire tablet in order to resolve her symptoms. Overall she is doing well. She denies any new neurological symptoms. She returns today for an evaluation.  HISTORY (DOHMEIER): Erica Powell is a 51 y.o. left handed, caucasian female Is seen here as a routine once a year revisit for multiple sclerosis, originally referred from Dr. Laurann Montana.  Erica Powell has been a patient in our neurology clinic for the last 8 years, beginning in 2006.   She was diagnosed with multiple sclerosis and treatment was initiated. She had no recent MS relapses but a brain MRI that had been performed in January 2014 was showing still scattered periventricular, subcortical and pontine T2 hyperintensities consistent with chronic demyelinating plaques of various age.  No acute lesions were seen in her last MRI obtained on 12/29/2012. She had 2 separate studies in 2006 at [facility]. Initally she had C5 and C 6 lesions, these dorsal cervical cord lesions did not span multiple levels. The patient was diagnosed in 2012 with diabetes and has a strong DM family history. Her last HbA1c just 2 month ago was 7.0 and had been in June 2014 at 8.3. She noticed an improvement in her urinary frequency once her sugars were lower. She also has still no clinical progression or relapse, but she reports problems with concentration, cognition and sometimes with multitasking. She feels that she is easily distracted and loses her train of thought when this happens. She feels especially distracted by noises at her work. 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 There's been no change in her employment, hours of work , hobbies or social life. She has a bachelor's degree ,is single but lives with her fianc. She recently noted a new symptom of tightness in her chest , a paraspinal tenderness and tightness, a confined feeling surrounding the chest , a possible variation of " the MS hug", she has responded well to massage, but the relief is not sustained.     REVIEW OF SYSTEMS:  Out of a complete 14 system review of symptoms, the patient complains only of the following symptoms, and all other reviewed systems are negative.  Trouble swallowing  ALLERGIES: Allergies  Allergen Reactions  . Cephalosporins Itching and Swelling    Occurs with cephalexin (Keflex). Starts with prickling around the face, swelling/numb feeling of face.  .  Hydrochlorothiazide Itching and Swelling    Swelling of the face, hives, itching, feeling of bad sunburn.   . Penicillins Itching and Swelling    Occurs with Augmentin and amoxicillin. Hives, prickling, swelling of face.   . Sulfa Antibiotics Itching and Swelling    Itching, hives, swelling of face  . Baclofen Other (See Comments)    Leg spasms, jittery, heart racing   . Clonazepam     Males me mean    HOME MEDICATIONS: Outpatient Medications Prior to Visit  Medication Sig Dispense Refill  . acetaminophen (TYLENOL) 500 MG tablet Take 2 tablets (1,000 mg total) by mouth every other day. Prior to Betaseron injection.    [name] aspirin 81 MG tablet Take 1 tablet (81 mg total) by mouth daily.    [name] atorvastatin (LIPITOR) 20 MG tablet Take 1 tablet by mouth daily.  3  . BETASERON 0.3 MG KIT injection INJECT 0.3 MGS INTO THE SKIN EVERY OTHER DAY 14 each 4  . cholecalciferol (VITAMIN D) 1000 UNITS tablet Take 1,000 Units by mouth daily.    . Dulaglutide (TRULICITY) 0.35 WS/5.6CL SOPN Inject 0.75 mg into the skin once a week.     . ferrous sulfate 325 (65 FE) MG tablet Take 325 mg by mouth daily with breakfast.    . hydrOXYzine (ATARAX/VISTARIL) 10 MG tablet Take 1 tablet (10 mg total) by mouth 3 (three) times daily as needed. 90 tablet 1  . lisinopril (PRINIVIL,ZESTRIL) 10 MG tablet Take 1 tablet (10 mg total) by mouth daily.    . Melatonin 10 MG TABS Take 10 mg by mouth at bedtime.    . metFORMIN (GLUCOPHAGE) 1000 MG tablet Take 1 tablet (1,000 mg total) by mouth 2 (two) times daily with a meal.    . rOPINIRole (REQUIP) 0.5 MG tablet Take 2 tablets (1 mg total) by mouth at bedtime. 60 tablet 5  . sertraline (ZOLOFT) 50 MG tablet TAKE 1 TABLET BY MOUTH DAILY. 90 tablet 1  . traMADol-acetaminophen (ULTRACET) 37.5-325 MG tablet TAKE 1-2 TABLETS BY MOUTH EVERY 6 HOURS AS NEEDED 60 tablet 1  . vitamin B-12 (CYANOCOBALAMIN) 1000 MCG tablet Take 1,000 mcg by mouth daily.    . magnesium gluconate  (MAGONATE) 500 MG tablet Take 500 mg by mouth 2 (two) times daily.     No facility-administered medications prior to visit.     PAST MEDICAL HISTORY: Past Medical History:  Diagnosis Date  . Anemia   . Diabetes mellitus   . Hyperlipemia   . Hypertension   . Multiple sclerosis ([place])   . Type 2 diabetes mellitus without (mention of) complications     PAST SURGICAL HISTORY: Past Surgical History:  Procedure Laterality Date  . BONE CYST EXCISION    . ESSURE TUBAL LIGATION    . WISDOM TOOTH EXTRACTION  1985    FAMILY HISTORY: Family History  Problem Relation Age of Onset  . Stroke Mother     SOCIAL HISTORY: Social History   Social History  . Marital status: Single    Spouse name: N/A  . Number of children: 0  . Years of education: Bachelor's   Occupational History  .  [employer]    Social History Main Topics  . Smoking status: Never Smoker  . Smokeless tobacco: Never Used  . Alcohol use No  . Drug use: No  . Sexual activity: Not on file   Other Topics Concern  . Not on file   Social History Narrative   Patient is single with no children   Patient is left handed   Patient has a Bachelor's degree   Patient drinks 20 oz daily      PHYSICAL EXAM  Vitals:   12/29/16 1307  BP: (!) 144/80  Pulse: 76  Weight: 196 lb 12.8 oz (89.3 kg)  Height: _0  (1.651 m)   Body mass index is 32.75 kg/m.  Generalized: Well developed, in no acute distress   Neurological examination  Mentation: Alert oriented to time, place, history taking. Follows all commands speech and language fluent Cranial nerve II-XII: Pupils were equal round reactive to light. Extraocular movements were full, visual field were full on confrontational test. Facial sensation and strength were normal. Uvula tongue midline. Head turning and shoulder shrug  were normal and symmetric. Motor: The motor testing reveals 5 over 5 strength of all 4 extremities. Good symmetric motor tone  is noted throughout.  Sensory: Sensory testing is intact to soft touch on all 4 extremities. No evidence of extinction is noted.  Coordination: Cerebellar testing reveals good finger-nose-finger and heel-to-shin bilaterally.  Gait and station: Gait is normal. Tandem gait is normal. Romberg is negative. No drift is seen.  Reflexes: Deep tendon reflexes are symmetric and normal bilaterally.   DIAGNOSTIC DATA (LABS, IMAGING, TESTING) - I reviewed patient records, labs, notes, testing and imaging myself where available.  Lab Results  Component Value Date   WBC 7.4 12/18/2014   HGB 12.0 12/18/2014   HCT 35.7 12/18/2014   MCV 83 12/18/2014      Component Value Date/Time   NA 139 12/18/2014 1431   K 4.6 12/18/2014 1431   CL 98 12/18/2014 1431   CO2 25 12/18/2014 1431   GLUCOSE 105 (H) 12/18/2014 1431   BUN 8 12/18/2014 1431   CREATININE 0.56 (L) 12/18/2014 1431   CALCIUM 10.1 12/18/2014 1431   PROT 7.6 12/18/2014 1431   ALBUMIN 4.6 12/18/2014 1431   AST 13 12/18/2014 1431   ALT 17 12/18/2014 1431   ALKPHOS 74 12/18/2014 1431   BILITOT 0.2 12/18/2014 1431   GFRNONAA 111 12/18/2014 1431   GFRAA 128 12/18/2014 1431      ASSESSMENT AND PLAN 51 y.o. year old female  has a past medical history of Anemia; Diabetes mellitus; Hyperlipemia; Hypertension; Multiple sclerosis ([place]); and Type 2 diabetes mellitus without (mention of) complications. here with:  1. Restless leg syndrome 2. Multiple sclerosis 3. History of iron deficiency   She will continue taking Requip 0.5 mg twice a day. She will continue on Betaseron for multiple sclerosis. I will check blood work today. I will also check ferritin and iron levels. Patient advised that if her symptoms worsen or she develops new symptoms she she'll let us know. She will follow-up in 6 months or sooner if needed.   [name], MSN, NP-C 12/29/2016, 1:37 PM [facility] [address], [place], [place]  [address] [phone]

## 2016-12-30 ENCOUNTER — Telehealth: Payer: Self-pay | Admitting: *Deleted

## 2016-12-30 LAB — CBC WITH DIFFERENTIAL/PLATELET
BASOS ABS: 0 10*3/uL (ref 0.0–0.2)
Basos: 1 %
EOS (ABSOLUTE): 0.1 10*3/uL (ref 0.0–0.4)
EOS: 2 %
Hematocrit: 35.7 % (ref 34.0–46.6)
Hemoglobin: 12.3 g/dL (ref 11.1–15.9)
IMMATURE GRANULOCYTES: 1 %
Immature Grans (Abs): 0.1 10*3/uL (ref 0.0–0.1)
Lymphocytes Absolute: 2 10*3/uL (ref 0.7–3.1)
Lymphs: 31 %
MCH: 30.3 pg (ref 26.6–33.0)
MCHC: 34.5 g/dL (ref 31.5–35.7)
MCV: 88 fL (ref 79–97)
MONOS ABS: 0.5 10*3/uL (ref 0.1–0.9)
Monocytes: 7 %
NEUTROS PCT: 58 %
Neutrophils Absolute: 3.9 10*3/uL (ref 1.4–7.0)
PLATELETS: 308 10*3/uL (ref 150–379)
RBC: 4.06 x10E6/uL (ref 3.77–5.28)
RDW: 14.6 % (ref 12.3–15.4)
WBC: 6.6 10*3/uL (ref 3.4–10.8)

## 2016-12-30 LAB — COMPREHENSIVE METABOLIC PANEL
ALT: 20 IU/L (ref 0–32)
AST: 17 IU/L (ref 0–40)
Albumin/Globulin Ratio: 1.7 (ref 1.2–2.2)
Albumin: 4.2 g/dL (ref 3.5–5.5)
Alkaline Phosphatase: 57 IU/L (ref 39–117)
BUN/Creatinine Ratio: 21 (ref 9–23)
BUN: 9 mg/dL (ref 6–24)
Bilirubin Total: <0.2 mg/dL (ref 0.0–1.2)
CALCIUM: 9.1 mg/dL (ref 8.7–10.2)
CO2: 20 mmol/L (ref 18–29)
CREATININE: 0.43 mg/dL — AB (ref 0.57–1.00)
Chloride: 99 mmol/L (ref 96–106)
GFR calc Af Amer: 137 mL/min/{1.73_m2} (ref >59)
GFR, EST NON AFRICAN AMERICAN: 119 mL/min/{1.73_m2} (ref >59)
GLUCOSE: 152 mg/dL — AB (ref 65–99)
Globulin, Total: 2.5 g/dL (ref 1.5–4.5)
POTASSIUM: 4 mmol/L (ref 3.5–5.2)
Sodium: 137 mmol/L (ref 134–144)
Total Protein: 6.7 g/dL (ref 6.0–8.5)

## 2016-12-30 LAB — IRON AND TIBC
IRON SATURATION: 29 % (ref 15–55)
IRON: 107 ug/dL (ref 27–159)
Total Iron Binding Capacity: 375 ug/dL (ref 250–450)
UIBC: 268 ug/dL (ref 131–425)

## 2016-12-30 LAB — FERRITIN: FERRITIN: 35 ng/mL (ref 15–150)

## 2016-12-30 NOTE — Telephone Encounter (Signed)
-----   Message from Butch Penny, NP sent at 12/30/2016 11:37 AM EST ----- Labwork unremarkable. Creatinine is slightly decreased. Please send blood work to her primary care provider. Please call patient with results.

## 2016-12-30 NOTE — Telephone Encounter (Signed)
LMVM for pt mobile (ok per DPR) that labs unremarkable.  Creatinine slightly decreased.  Will send copy to pcp, Dr. Kirby Funk.  Pt to call back if questions.

## 2017-01-01 ENCOUNTER — Encounter: Payer: Self-pay | Admitting: Pharmacist

## 2017-01-01 NOTE — Progress Notes (Signed)
Completing chart review for patient on specialty medication.  Patient is currently taking Betaseron for MS. Patient is managed by Dr. Vickey Huger for this.    Monitoring: CBC: WNL - done 12/29/16 LFTs: WNL - done 12/29/16 Thyroid function tests: WNL Flu-like symptoms: takes pre-treatment acetaminophen to prevent  Neuropsychiatric symptoms: none reported Injection-site reactions: none reported  O:     Lab Results  Component Value Date   WBC 6.6 12/29/2016   HGB 12.0 12/18/2014   HCT 35.7 12/29/2016   MCV 88 12/29/2016   PLT 308 12/29/2016      Chemistry      Component Value Date/Time   NA 137 12/29/2016 1359   K 4.0 12/29/2016 1359   CL 99 12/29/2016 1359   CO2 20 12/29/2016 1359   BUN 9 12/29/2016 1359   CREATININE 0.43 (L) 12/29/2016 1359      Component Value Date/Time   CALCIUM 9.1 12/29/2016 1359   ALKPHOS 57 12/29/2016 1359   AST 17 12/29/2016 1359   ALT 20 12/29/2016 1359   BILITOT <0.2 12/29/2016 1359       A/P: 1. Medication review: Patient is tolerating Betaseron well per last visit with neurology. Labs WNL, no recommendations for any changes at this time. Patient due for video visit follow up with me at time of next refill order (around February- March 2018).  Alvino Blood, PharmD, BCPS, BCACP, CPP Clinical Pharmacist Practitioner  [facility] [phone]

## 2017-01-12 DIAGNOSIS — Z7984 Long term (current) use of oral hypoglycemic drugs: Secondary | ICD-10-CM | POA: Diagnosis not present

## 2017-01-12 DIAGNOSIS — I10 Essential (primary) hypertension: Secondary | ICD-10-CM | POA: Diagnosis not present

## 2017-01-12 DIAGNOSIS — E119 Type 2 diabetes mellitus without complications: Secondary | ICD-10-CM | POA: Diagnosis not present

## 2017-01-13 ENCOUNTER — Encounter: Payer: Self-pay | Admitting: Neurology

## 2017-01-15 MED FILL — TRULICITY 1.5 MG/0.5 ML PEN: 1.5 | 28 days supply | Qty: 2 | Fill #0

## 2017-01-15 MED FILL — ACCU-CHEK GUIDE TEST STRIP: 90 days supply | Qty: 200 | Fill #0

## 2017-01-15 MED FILL — ACCU-CHEK FASTCLIX LANCETS: 90 days supply | Qty: 204 | Fill #0

## 2017-01-18 ENCOUNTER — Telehealth: Payer: Self-pay

## 2017-01-18 MED FILL — TRAMADOL-APAP 37.5-325 TAB: 37.5-325 | 8 days supply | Qty: 60 | Fill #1

## 2017-01-18 NOTE — Telephone Encounter (Signed)
PA received for Betaseron, I have filled this out and faxed back in.

## 2017-01-19 NOTE — Telephone Encounter (Signed)
Per new fax request, I called [employer]'s confidential phone line and confirmed that patient does have relapsing-remitting MS. Left call back # for any further questions.

## 2017-01-20 NOTE — Telephone Encounter (Signed)
Prior Auth approved from 01/20/17-01/19/18 PA ref # [id number] [facility] P#[id number]

## 2017-01-28 ENCOUNTER — Other Ambulatory Visit: Payer: Self-pay | Admitting: Pharmacist

## 2017-01-28 ENCOUNTER — Telehealth: Payer: Self-pay

## 2017-01-28 MED ORDER — BETASERON 0.3 MG ~~LOC~~ KIT: PACK | SUBCUTANEOUS | 11 refills | Status: DC

## 2017-01-28 MED FILL — rOPINIRole HCL 0.5 MG TABS: 0.5 | 30 days supply | Qty: 60 | Fill #1

## 2017-01-28 MED FILL — BETASERON 0.3 MG KIT: 0.3 | 28 days supply | Qty: 14 | Fill #4

## 2017-01-28 NOTE — Telephone Encounter (Signed)
Refill sent to the pharmacy 

## 2017-02-01 MED FILL — ATORVASTATIN 20 MG TABLET: 20 | 90 days supply | Qty: 90 | Fill #0

## 2017-02-10 MED FILL — TRULICITY 1.5 MG/0.5 ML PEN: 1.5 | 28 days supply | Qty: 2 | Fill #1

## 2017-02-23 ENCOUNTER — Other Ambulatory Visit: Payer: Self-pay | Admitting: Neurology

## 2017-02-23 MED FILL — SERTRALINE HCL 50 MG TABLET: 50 | 90 days supply | Qty: 90 | Fill #0

## 2017-02-23 MED FILL — rOPINIRole HCL 0.5 MG TABS: 0.5 | 30 days supply | Qty: 60 | Fill #2

## 2017-02-23 MED FILL — BETASERON 0.3 MG KIT: 0.3 | 28 days supply | Qty: 14 | Fill #0

## 2017-02-24 ENCOUNTER — Other Ambulatory Visit: Payer: Self-pay

## 2017-02-24 MED ORDER — TRAMADOL-ACETAMINOPHEN 37.5-325 MG PO TABS: 1.0000 | ORAL_TABLET | Freq: Four times a day (QID) | ORAL | 1 refills | Status: DC | PRN

## 2017-02-25 MED FILL — TRAMADOL-APAP 37.5-325 TAB: 37.5-325 | 8 days supply | Qty: 60 | Fill #0

## 2017-02-25 NOTE — Telephone Encounter (Signed)
RX for tramadol faxed to [facility]. Received a receipt of confirmation.

## 2017-03-10 MED FILL — TRULICITY 1.5 MG/0.5 ML PEN: 1.5 | 28 days supply | Qty: 2 | Fill #2

## 2017-03-24 MED FILL — metFORMIN HCL 1000 MG TABS: 1000 | 90 days supply | Qty: 180 | Fill #2

## 2017-03-24 MED FILL — BETASERON 0.3 MG KIT: 0.3 | 28 days supply | Qty: 14 | Fill #1

## 2017-03-24 MED FILL — rOPINIRole HCL 0.5 MG TABS: 0.5 | 30 days supply | Qty: 60 | Fill #3

## 2017-03-25 MED FILL — LISINOPRIL 10 MG TABLET: 10 | 90 days supply | Qty: 90 | Fill #0

## 2017-03-29 MED FILL — TRAMADOL-ACETAMINOPHN 37.5-: 37.5-325 | 8 days supply | Qty: 60 | Fill #1

## 2017-04-08 MED FILL — TRULICITY 1.5 MG/0.5 ML PEN: 1.5 | 28 days supply | Qty: 2 | Fill #3

## 2017-04-08 MED FILL — DENTA 5000 PLUS CREAM: 1.1 | 30 days supply | Qty: 51 | Fill #2

## 2017-04-14 DIAGNOSIS — H524 Presbyopia: Secondary | ICD-10-CM | POA: Diagnosis not present

## 2017-04-14 DIAGNOSIS — E113293 Type 2 diabetes mellitus with mild nonproliferative diabetic retinopathy without macular edema, bilateral: Secondary | ICD-10-CM | POA: Diagnosis not present

## 2017-04-14 DIAGNOSIS — H52223 Regular astigmatism, bilateral: Secondary | ICD-10-CM | POA: Diagnosis not present

## 2017-04-14 DIAGNOSIS — H3562 Retinal hemorrhage, left eye: Secondary | ICD-10-CM | POA: Diagnosis not present

## 2017-04-14 DIAGNOSIS — H5213 Myopia, bilateral: Secondary | ICD-10-CM | POA: Diagnosis not present

## 2017-04-14 DIAGNOSIS — H35031 Hypertensive retinopathy, right eye: Secondary | ICD-10-CM | POA: Diagnosis not present

## 2017-04-14 DIAGNOSIS — H3582 Retinal ischemia: Secondary | ICD-10-CM | POA: Diagnosis not present

## 2017-04-21 ENCOUNTER — Other Ambulatory Visit: Payer: Self-pay | Admitting: Neurology

## 2017-04-21 MED FILL — rOPINIRole HCL 0.5 MG TABS: 0.5 | 30 days supply | Qty: 60 | Fill #4

## 2017-04-21 MED FILL — BETASERON 0.3 MG KIT: 0.3 | 28 days supply | Qty: 14 | Fill #2

## 2017-04-22 MED FILL — TRAMADOL-ACETAMINOPHN 37.5-: 37.5-325 | 4 days supply | Qty: 30 | Fill #0

## 2017-04-22 NOTE — Telephone Encounter (Signed)
RX for tramadol faxed to [facility]. Received a receipt of confirmation.  

## 2017-05-06 MED FILL — ATORVASTATIN 20 MG TABLET: 20 | 90 days supply | Qty: 90 | Fill #1

## 2017-05-06 MED FILL — TRULICITY 1.5 MG/0.5 ML PEN: 1.5 | 28 days supply | Qty: 2 | Fill #4

## 2017-05-06 MED FILL — TRAMADOL-ACETAMINOPHN 37.5-: 37.5-325 | 4 days supply | Qty: 30 | Fill #1

## 2017-05-19 MED FILL — rOPINIRole HCL 0.5 MG TABS: 0.5 | 30 days supply | Qty: 60 | Fill #5

## 2017-05-19 MED FILL — BETASERON 0.3 MG KIT: 0.3 | 28 days supply | Qty: 14 | Fill #3

## 2017-05-19 MED FILL — SERTRALINE HCL 50 MG TABLET: 50 | 90 days supply | Qty: 90 | Fill #1

## 2017-05-21 ENCOUNTER — Other Ambulatory Visit: Payer: Self-pay | Admitting: Neurology

## 2017-05-25 ENCOUNTER — Other Ambulatory Visit: Payer: Self-pay | Admitting: Neurology

## 2017-05-25 MED FILL — TRAMADOL-ACETAMINOPHN 37.5-: 37.5-325 | 4 days supply | Qty: 30 | Fill #0

## 2017-05-25 NOTE — Telephone Encounter (Signed)
Faxed printed/signed rx to pt pharmacy. Fax: [phone]. Received confirmation.

## 2017-06-15 ENCOUNTER — Other Ambulatory Visit: Payer: Self-pay | Admitting: Adult Health

## 2017-06-15 MED FILL — BETASERON 0.3 MG KIT: 0.3 | 28 days supply | Qty: 14 | Fill #4

## 2017-06-15 MED FILL — LISINOPRIL 10 MG TABLET: 10 | 90 days supply | Qty: 90 | Fill #1

## 2017-06-15 MED FILL — rOPINIRole HCL 0.5 MG TABS: 0.5 | 30 days supply | Qty: 60 | Fill #0

## 2017-06-15 MED FILL — TRAMADOL-ACETAMINOPHN 37.5-: 37.5-325 | 4 days supply | Qty: 30 | Fill #1

## 2017-06-15 MED FILL — TRULICITY 1.5 MG/0.5 ML PEN: 1.5 | 28 days supply | Qty: 2 | Fill #5

## 2017-06-15 MED FILL — DENTA 5000 PLUS CREAM: 1.1 | 30 days supply | Qty: 51 | Fill #3

## 2017-06-28 ENCOUNTER — Ambulatory Visit (INDEPENDENT_AMBULATORY_CARE_PROVIDER_SITE_OTHER): Payer: 59 | Admitting: Neurology

## 2017-06-28 ENCOUNTER — Encounter: Payer: Self-pay | Admitting: Neurology

## 2017-06-28 VITALS — BP 135/74 | HR 96 | Ht 65.0 in | Wt 190.0 lb

## 2017-06-28 DIAGNOSIS — G253 Myoclonus: Secondary | ICD-10-CM | POA: Diagnosis not present

## 2017-06-28 DIAGNOSIS — G35 Multiple sclerosis: Secondary | ICD-10-CM | POA: Diagnosis not present

## 2017-06-28 MED ORDER — TRAMADOL-ACETAMINOPHEN 37.5-325 MG PO TABS: 1.0000 | ORAL_TABLET | Freq: Four times a day (QID) | ORAL | 5 refills | Status: DC | PRN

## 2017-06-28 MED ORDER — LORAZEPAM 0.5 MG PO TABS: 0.5000 mg | ORAL_TABLET | Freq: Every day | ORAL | 5 refills | Status: DC

## 2017-06-28 MED ORDER — ROPINIROLE HCL 0.5 MG PO TABS: 1.0000 mg | ORAL_TABLET | Freq: Every day | ORAL | 5 refills | Status: DC

## 2017-06-28 MED ORDER — CLONAZEPAM 0.5 MG PO TABS: 0.2500 mg | ORAL_TABLET | Freq: Two times a day (BID) | ORAL | 3 refills | Status: DC | PRN

## 2017-06-28 MED FILL — LORazepam 0.5 MG TABS: 0.5 | 30 days supply | Qty: 30 | Fill #0

## 2017-06-28 MED FILL — TRAMADOL-ACETAMINOPHN 37.5-: 37.5-325 | 4 days supply | Qty: 30 | Fill #0

## 2017-06-28 MED FILL — metFORMIN HCL 1000 MG TABS: 1000 | 90 days supply | Qty: 180 | Fill #3

## 2017-06-28 NOTE — Addendum Note (Signed)
Addended by: Melvyn Novas on: 06/28/2017 02:27 PM   Modules accepted: Orders

## 2017-06-28 NOTE — Addendum Note (Signed)
Addended by: Melvyn Novas on: 06/28/2017 02:31 PM   Modules accepted: Orders

## 2017-06-28 NOTE — Patient Instructions (Signed)
Clonazepam tablets What is this medicine? CLONAZEPAM (kloe NA ze pam) is a benzodiazepine. It is used to treat certain types of seizures. It is also used to treat panic disorder. This medicine may be used for other purposes; ask your health care provider or pharmacist if you have questions. COMMON BRAND NAME(S): Ceberclon, Klonopin What should I tell my health care provider before I take this medicine? They need to know if you have any of these conditions: -an alcohol or drug abuse problem -bipolar disorder, depression, psychosis or other mental health condition -glaucoma -kidney or liver disease -lung or breathing disease -myasthenia gravis -Parkinson's disease -porphyria -seizures or a history of seizures -suicidal thoughts -an unusual or allergic reaction to clonazepam, other benzodiazepines, foods, dyes, or preservatives -pregnant or trying to get pregnant -breast-feeding How should I use this medicine? Take this medicine by mouth with a glass of water. Follow the directions on the prescription label. If it upsets your stomach, take it with food or milk. Take your medicine at regular intervals. Do not take it more often than directed. Do not stop taking or change the dose except on the advice of your doctor or health care professional. A special MedGuide will be given to you by the pharmacist with each prescription and refill. Be sure to read this information carefully each time. Talk to your pediatrician regarding the use of this medicine in children. Special care may be needed. Overdosage: If you think you have taken too much of this medicine contact a poison control center or emergency room at once. NOTE: This medicine is only for you. Do not share this medicine with others. What if I miss a dose? If you miss a dose, take it as soon as you can. If it is almost time for your next dose, take only that dose. Do not take double or extra doses. What may interact with this medicine? Do  not take this medication with any of the following medicines: -narcotic medicines for cough -sodium oxybate This medicine may also interact with the following medications: -alcohol -antihistamines for allergy, cough and cold -antiviral medicines for HIV or AIDS -certain medicines for anxiety or sleep -certain medicines for depression, like amitriptyline, fluoxetine, sertraline -certain medicines for fungal infections like ketoconazole and itraconazole -certain medicines for seizures like carbamazepine, phenobarbital, phenytoin, primidone -general anesthetics like halothane, isoflurane, methoxyflurane, propofol -local anesthetics like lidocaine, pramoxine, tetracaine -medicines that relax muscles for surgery -narcotic medicines for pain -phenothiazines like chlorpromazine, mesoridazine, prochlorperazine, thioridazine This list may not describe all possible interactions. Give your health care provider a list of all the medicines, herbs, non-prescription drugs, or dietary supplements you use. Also tell them if you smoke, drink alcohol, or use illegal drugs. Some items may interact with your medicine. What should I watch for while using this medicine? Tell your doctor or health care professional if your symptoms do not start to get better or if they get worse. Do not stop taking except on your doctor's advice. You may develop a severe reaction. Your doctor will tell you how much medicine to take. You may get drowsy or dizzy. Do not drive, use machinery, or do anything that needs mental alertness until you know how this medicine affects you. To reduce the risk of dizzy and fainting spells, do not stand or sit up quickly, especially if you are an older patient. Alcohol may increase dizziness and drowsiness. Avoid alcoholic drinks. If you are taking another medicine that also causes drowsiness, you may have more side   effects. Give your health care provider a list of all medicines you use. Your doctor  will tell you how much medicine to take. Do not take more medicine than directed. Call emergency for help if you have problems breathing or unusual sleepiness. The use of this medicine may increase the chance of suicidal thoughts or actions. Pay special attention to how you are responding while on this medicine. Any worsening of mood, or thoughts of suicide or dying should be reported to your health care professional right away. What side effects may I notice from receiving this medicine? Side effects that you should report to your doctor or health care professional as soon as possible: -allergic reactions like skin rash, itching or hives, swelling of the face, lips, or tongue -breathing problems -confusion -loss of balance or coordination -signs and symptoms of low blood pressure like dizziness; feeling faint or lightheaded, falls; unusually weak or tired -suicidal thoughts or mood changes Side effects that usually do not require medical attention (report to your doctor or health care professional if they continue or are bothersome): -dizziness -headache -tiredness -upset stomach This list may not describe all possible side effects. Call your doctor for medical advice about side effects. You may report side effects to FDA at 1-800-FDA-1088. Where should I keep my medicine? Keep out of the reach of children. This medicine can be abused. Keep your medicine in a safe place to protect it from theft. Do not share this medicine with anyone. Selling or giving away this medicine is dangerous and against the law. This medicine may cause accidental overdose and death if taken by other adults, children, or pets. Mix any unused medicine with a substance like cat litter or coffee grounds. Then throw the medicine away in a sealed container like a sealed bag or a coffee can with a lid. Do not use the medicine after the expiration date. Store at room temperature between 15 and 30 degrees C (59 and 86 degrees F).  Protect from light. Keep container tightly closed. NOTE: This sheet is a summary. It may not cover all possible information. If you have questions about this medicine, talk to your doctor, pharmacist, or health care provider.  2018 Elsevier/Gold Standard (2016-05-01 18:46:32)  

## 2017-06-28 NOTE — Addendum Note (Signed)
Addended by: Melvyn Novas on: 06/28/2017 02:35 PM   Modules accepted: Orders

## 2017-06-28 NOTE — Progress Notes (Addendum)
PATIENT: Erica Powell DOB: [birth year]  REASON FOR VISIT: follow up HISTORY FROM: patient  HISTORY OF PRESENT ILLNESS: Interval history from 06/28/2017. Mirai is returning today for a six-month revisit she had over the last 6 months taking an increased dose of Requip which has not helped her restless legs. She has some myoclonic jerking supple movements in both lower extremities, but mostly on the right. It starts as a painful spasm and then releases suddenly. It is very bothersome. The therapy works has not relieved the distress either. She has started dropping things more than usual, she may drop something right out of her hands. She has sometimes double vision, when she gets fatigue her left eye abducts to the center creating a horizontal diplopia. She also states that she has some cognitive problems forgets what she is doing, sometimes she has to talk to her legs to move, the jerks have gotten worse sometimes it banged up against the desk or buckle and she is walking. In the morning however she is stiff as well as when she stands for long periods at a time or sitting for longer time at the computer. She also has urinary problems. She sweats profusely when she mops, does homework housework physical work it is not like a hot flash. She wonders if it could be a mass. She states that she feels " pretty much like crap all the time not just the normal tiredness".   Erica Powell is a 51 year old female with a history of multiple sclerosis and restless leg. She returns today for follow-up. Patient reports that she's had an increase in restless leg symptoms. Usually worse at bedtime.. She has increased her Requip taking it in the morning and at night. She states that this has offered her some benefit. In the past she has been diagnosed with iron deficiency and was given IV iron. She is not had any additional blood work. She continues on Betaseron. She is tolerating this well. Denies any new neurological  symptoms. Overall she feels that she is doing well. She returns today for an evaluation.  HISTORY 12/31/15: Erica Powell is a 51 year old female with a history of multiple sclerosis and restless leg. She returns today for follow-up. The patient reports that she has been stable. She continues on Betaseron. She recently had lab work in November. She denies any trouble with her gait or balance. She states occasionally she will be stiff in the mornings but that resolves. She also states that occasionally she will have some urinary urgency but this is intermittent. Denies any trouble with her vision. However she does state that she has an appointment to have a torn retina repair. The patient is currently taking Requip for restless leg syndrome. She states 0.25 mg at bedtime. She states occasionally she has to take the entire tablet in order to resolve her symptoms. Overall she is doing well. She denies any new neurological symptoms. She returns today for an evaluation.  HISTORY (Erica Powell): Erica Powell is a 51 y.o. left handed, caucasian female Is seen here as a routine once a year revisit for multiple sclerosis, originally referred from Dr. Laurann Montana.  Erica Powell has been a patient in our neurology clinic for the last 8 years, beginning in 2006.  She was diagnosed with multiple sclerosis and treatment was initiated. She had no recent MS relapses but a brain MRI that had been performed in January 2014 was showing still scattered periventricular, subcortical and pontine T2 hyperintensities consistent with chronic demyelinating  plaques of various age.  No acute lesions were seen in her last MRI obtained on 12/29/2012. She had 2 separate studies in 2006 at [facility]. Initally she had C5 and C 6 lesions, these dorsal cervical cord lesions did not span multiple levels. The patient was diagnosed in 2012 with diabetes and has a strong DM family history. Her last HbA1c just 2 month ago was 7.0 and had been in June  2014 at 8.3. She noticed an improvement in her urinary frequency once her sugars were lower. She also has still no clinical progression or relapse, but she reports problems with concentration, cognition and sometimes with multitasking. She feels that she is easily distracted and loses her train of thought when this happens. She feels especially distracted by noises at her work. 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 There's been no change in her employment, hours of work , hobbies or social life. She has a bachelor's degree ,is single but lives with her fianc. She recently noted a new symptom of tightness in her chest , a paraspinal tenderness and tightness, a confined feeling surrounding the chest , a possible variation of " the MS hug", she has responded well to massage, but the relief is not sustained.   She is a [employer] day [employer].     REVIEW OF SYSTEMS: Out of a complete 14 system review of symptoms, the patient complains only of the following symptoms, and all other reviewed systems are negative.  Trouble swallowing, myoclonic sudden jerking. Not classic RLS, fatigue.   ALLERGIES: Allergies  Allergen Reactions  . Cephalosporins Itching and Swelling    Occurs with cephalexin (Keflex). Starts with prickling around the face, swelling/numb feeling of face.  . Hydrochlorothiazide Itching and Swelling    Swelling of the face, hives, itching, feeling of bad sunburn.   . Penicillins Itching and Swelling    Occurs with Augmentin and amoxicillin. Hives, prickling, swelling of face.   . Sulfa Antibiotics Itching and Swelling    Itching, hives, swelling of face  . Baclofen Other (See Comments)    Leg spasms, jittery, heart racing   . Clonazepam     Males me mean    HOME MEDICATIONS: Outpatient Medications Prior to Visit  Medication Sig Dispense Refill  . acetaminophen (TYLENOL) 500 MG tablet Take 2 tablets (1,000 mg total) by mouth every other  day. Prior to Betaseron injection.    [name] aspirin 81 MG tablet Take 1 tablet (81 mg total) by mouth daily.    [name] atorvastatin (LIPITOR) 20 MG tablet Take 1 tablet by mouth daily.  3  . BETASERON 0.3 MG KIT injection INJECT 0.3 MGS INTO THE SKIN EVERY OTHER DAY 14 each 11  . cholecalciferol (VITAMIN D) 1000 UNITS tablet Take 1,000 Units by mouth daily.    . Dulaglutide (TRULICITY) 1.50 VW/9.7XY SOPN Inject 1.5 mg into the skin once a week.     . ferrous sulfate 325 (65 FE) MG tablet Take 325 mg by mouth daily with breakfast.    . hydrOXYzine (ATARAX/VISTARIL) 10 MG tablet Take 1 tablet (10 mg total) by mouth 3 (three) times daily as needed. 90 tablet 1  . lisinopril (PRINIVIL,ZESTRIL) 10 MG tablet Take 1 tablet (10 mg total) by mouth daily.    . Melatonin 10 MG TABS Take 10 mg by mouth at bedtime.    . metFORMIN (GLUCOPHAGE) 1000 MG tablet Take 1 tablet (1,000 mg total) by mouth 2 (two) times daily  with a meal.    . rOPINIRole (REQUIP) 0.5 MG tablet TAKE 2 TABLETS (1 MG TOTAL) BY MOUTH AT BEDTIME. 60 tablet 5  . sertraline (ZOLOFT) 50 MG tablet TAKE 1 TABLET BY MOUTH DAILY. 90 tablet 1  . traMADol-acetaminophen (ULTRACET) 37.5-325 MG tablet TAKE 1 TO 2 TABLETS BY MOUTH EVERY 6 HOURS AS NEEDED 30 tablet 1  . vitamin B-12 (CYANOCOBALAMIN) 1000 MCG tablet Take 1,000 mcg by mouth daily.     No facility-administered medications prior to visit.     PAST MEDICAL HISTORY: Past Medical History:  Diagnosis Date  . Anemia   . Diabetes mellitus   . Hyperlipemia   . Hypertension   . Multiple sclerosis ([place])   . Type 2 diabetes mellitus without (mention of) complications     PAST SURGICAL HISTORY: Past Surgical History:  Procedure Laterality Date  . BONE CYST EXCISION    . ESSURE TUBAL LIGATION    . WISDOM TOOTH EXTRACTION  1985    FAMILY HISTORY: Family History  Problem Relation Age of Onset  . Stroke Mother     SOCIAL HISTORY: Social History   Social History  . Marital status: Single      Spouse name: N/A  . Number of children: 0  . Years of education: Bachelor's   Occupational History  . [employer]    Social History Main Topics  . Smoking status: Never Smoker  . Smokeless tobacco: Never Used  . Alcohol use No  . Drug use: No  . Sexual activity: Not on file   Other Topics Concern  . Not on file   Social History Narrative   Patient is single with no children   Patient is left handed   Patient has a Bachelor's degree   Patient drinks 20 oz daily      PHYSICAL EXAM  Vitals:   06/28/17 1316  BP: 135/74  Pulse: 96  Weight: 190 lb (86.2 kg)  Height: '5\' 5"'$  (1.651 m)   Body mass index is 31.62 kg/m.  Generalized: Well developed, in no acute distress   Neurological examination  Mentation: Alert oriented to time, place, history taking. Follows all commands speech and language fluent Cranial nerve ,: no change in taste or smell. Pupils were equal round reactive to light. Extraocular movements were not full - she has left lazy eye, adducting , causing diplopia. Facial sensation and strength were normal. Uvula tongue midline. Head turning and shoulder shrug  were normal and symmetric. Motor: symmetric motor tone is noted throughout.  Sensory:intact to soft touch on all 4 extremities.  Coordination: Cerebellar testing reveals intact finger-nose-finger and heel-to-shin bilaterally.  Gait and station: Gait is normal. Turns with 3 steps.  Reflexes: Deep tendon reflexes are symmetric bilaterally.   DIAGNOSTIC DATA (LABS, IMAGING, TESTING) - I reviewed patient records, labs, notes, testing and imaging myself where available.  Lab Results  Component Value Date   WBC 6.6 12/29/2016   HGB 12.3 12/29/2016   HCT 35.7 12/29/2016   MCV 88 12/29/2016   PLT 308 12/29/2016      Component Value Date/Time   NA 137 12/29/2016 1359   K 4.0 12/29/2016 1359   CL 99 12/29/2016 1359   CO2 20 12/29/2016 1359   GLUCOSE 152 (H) 12/29/2016 1359   BUN 9  12/29/2016 1359   CREATININE 0.43 (L) 12/29/2016 1359   CALCIUM 9.1 12/29/2016 1359   PROT 6.7 12/29/2016 1359   ALBUMIN 4.2 12/29/2016 1359   AST 17  12/29/2016 1359   ALT 20 12/29/2016 1359   ALKPHOS 57 12/29/2016 1359   BILITOT <0.2 12/29/2016 1359   GFRNONAA 119 12/29/2016 1359   GFRAA 137 12/29/2016 1359      ASSESSMENT AND PLAN 51 y.o. year old female  has a past medical history of Anemia; Diabetes mellitus; Hyperlipemia; Hypertension; Multiple sclerosis ([place]); and Type 2 diabetes mellitus without (mention of) complications. here with:  1. Restless leg syndrome --- the patient reports myoclonic jerking, sudden uncontrollable movements sometimes in both legs at the same time but dominantly affecting the right leg. She may sit at her desk in both knees hit the top drawer. I will investigate if she has new lesions in her thoracic spine.  2. Multiple sclerosis ----------- Jone Baseman has MS and was diagnosed in the year 2006 3. History of iron deficiency - last iron test was normal, takes one a day OTC.   She will continue taking Requip 1 mg once a day.  Klonopin : she reminded me that she became hateful and had hallucinations. Cancel prescription.  Ativan trial instead.    Larey Seat, MD  06/28/2017, 2:02 PM [facility] [address], [place], [place] [address] [phone]

## 2017-07-07 ENCOUNTER — Ambulatory Visit (INDEPENDENT_AMBULATORY_CARE_PROVIDER_SITE_OTHER): Payer: 59

## 2017-07-07 DIAGNOSIS — G253 Myoclonus: Secondary | ICD-10-CM

## 2017-07-07 DIAGNOSIS — G35 Multiple sclerosis: Secondary | ICD-10-CM

## 2017-07-07 MED ORDER — GADOPENTETATE DIMEGLUMINE 469.01 MG/ML IV SOLN: 18.0000 mL | Freq: Once | INTRAVENOUS | Status: AC | PRN

## 2017-07-07 MED FILL — TRULICITY 1.5 MG/0.5 ML PEN: 1.5 | 28 days supply | Qty: 2 | Fill #6

## 2017-07-14 ENCOUNTER — Telehealth: Payer: Self-pay | Admitting: Neurology

## 2017-07-14 MED FILL — rOPINIRole HCL 0.5 MG TABS: 0.5 | 30 days supply | Qty: 60 | Fill #1

## 2017-07-14 MED FILL — BETASERON 0.3 MG KIT: 0.3 | 28 days supply | Qty: 14 | Fill #5

## 2017-07-14 NOTE — Telephone Encounter (Signed)
Discussed the results with the pt in regards to her MRI of thoracic and cervical spine. MD stated that new MS lesions notes in the cervical spine which could be the cause of symptoms. Thoracic spine was clear. Pt verbalized understanding at this time. Pt questioned where do we go from here. I informed her that we can discuss further at her follow up apt in October. Pt agreed and had no further questions.

## 2017-07-14 NOTE — Telephone Encounter (Signed)
-----   Message from Melvyn Novas, MD sent at 07/13/2017  5:53 PM EDT ----- No ne findings but MS has affected the cervical spine and can be in conjunction with DDD, foraminal stenosis a cause for the patient's symptoms.

## 2017-07-16 MED FILL — TRAMADOL-ACETAMINOPHN 37.5-: 37.5-325 | 4 days supply | Qty: 30 | Fill #1

## 2017-07-27 DIAGNOSIS — D509 Iron deficiency anemia, unspecified: Secondary | ICD-10-CM | POA: Diagnosis not present

## 2017-07-27 DIAGNOSIS — N182 Chronic kidney disease, stage 2 (mild): Secondary | ICD-10-CM | POA: Diagnosis not present

## 2017-07-27 DIAGNOSIS — E7800 Pure hypercholesterolemia, unspecified: Secondary | ICD-10-CM | POA: Diagnosis not present

## 2017-07-27 DIAGNOSIS — I10 Essential (primary) hypertension: Secondary | ICD-10-CM | POA: Diagnosis not present

## 2017-07-27 DIAGNOSIS — Z7984 Long term (current) use of oral hypoglycemic drugs: Secondary | ICD-10-CM | POA: Diagnosis not present

## 2017-07-27 DIAGNOSIS — E1165 Type 2 diabetes mellitus with hyperglycemia: Secondary | ICD-10-CM | POA: Diagnosis not present

## 2017-07-27 DIAGNOSIS — E1121 Type 2 diabetes mellitus with diabetic nephropathy: Secondary | ICD-10-CM | POA: Diagnosis not present

## 2017-07-27 DIAGNOSIS — Z0000 Encounter for general adult medical examination without abnormal findings: Secondary | ICD-10-CM | POA: Diagnosis not present

## 2017-08-02 MED FILL — TRAMADOL-ACETAMINOPHN 37.5-: 37.5-325 | 4 days supply | Qty: 30 | Fill #2

## 2017-08-02 MED FILL — ATORVASTATIN 20 MG TABLET: 20 | 90 days supply | Qty: 90 | Fill #2

## 2017-08-02 MED FILL — TRULICITY 1.5 MG/0.5 ML PEN: 1.5 | 28 days supply | Qty: 2 | Fill #7

## 2017-08-06 DIAGNOSIS — Z1211 Encounter for screening for malignant neoplasm of colon: Secondary | ICD-10-CM | POA: Diagnosis not present

## 2017-08-11 ENCOUNTER — Other Ambulatory Visit: Payer: Self-pay | Admitting: Neurology

## 2017-08-11 MED FILL — BETASERON 0.3 MG KIT: 0.3 | 28 days supply | Qty: 14 | Fill #6

## 2017-08-11 MED FILL — SERTRALINE HCL 50 MG TABLET: 50 | 90 days supply | Qty: 90 | Fill #0

## 2017-08-11 MED FILL — rOPINIRole HCL 0.5 MG TABS: 0.5 | 30 days supply | Qty: 60 | Fill #2

## 2017-08-16 MED FILL — TRAMADOL-ACETAMINOPHN 37.5-: 37.5-325 | 4 days supply | Qty: 30 | Fill #3

## 2017-08-30 ENCOUNTER — Other Ambulatory Visit: Payer: Self-pay | Admitting: Neurology

## 2017-08-30 MED FILL — TRULICITY 1.5 MG/0.5 ML PEN: 1.5 | 28 days supply | Qty: 2 | Fill #8

## 2017-08-30 MED FILL — hydrOXYzine HCL 10 MG TABS: 10 | 30 days supply | Qty: 90 | Fill #0

## 2017-09-08 DIAGNOSIS — Z01419 Encounter for gynecological examination (general) (routine) without abnormal findings: Secondary | ICD-10-CM | POA: Diagnosis not present

## 2017-09-08 DIAGNOSIS — Z780 Asymptomatic menopausal state: Secondary | ICD-10-CM | POA: Diagnosis not present

## 2017-09-08 DIAGNOSIS — Z1231 Encounter for screening mammogram for malignant neoplasm of breast: Secondary | ICD-10-CM | POA: Diagnosis not present

## 2017-09-08 DIAGNOSIS — Z6831 Body mass index (BMI) 31.0-31.9, adult: Secondary | ICD-10-CM | POA: Diagnosis not present

## 2017-09-08 MED FILL — LISINOPRIL 10 MG TABS: 10 | 90 days supply | Qty: 90 | Fill #2

## 2017-09-08 MED FILL — BETASERON 0.3 MG KIT: 0.3 | 28 days supply | Qty: 14 | Fill #7

## 2017-09-08 MED FILL — TRAMADOL-ACETAMINOPHN 37.5-: 37.5-325 | 4 days supply | Qty: 30 | Fill #4

## 2017-09-08 MED FILL — rOPINIRole HCL 0.5 MG TABS: 0.5 | 30 days supply | Qty: 60 | Fill #3

## 2017-09-09 MED FILL — PROGESTERONE 200 MG CAPSULE: 200 | 10 days supply | Qty: 10 | Fill #0

## 2017-09-24 MED FILL — TRULICITY 1.5 MG/0.5 ML PEN: 1.5 | 28 days supply | Qty: 2 | Fill #9

## 2017-09-30 ENCOUNTER — Ambulatory Visit: Payer: 59 | Admitting: Adult Health

## 2017-10-06 MED FILL — BETASERON 0.3 MG KIT: 0.3 | 28 days supply | Qty: 14 | Fill #8

## 2017-10-06 MED FILL — TRAMADOL-ACETAMINOPHN 37.5-: 37.5-325 | 4 days supply | Qty: 30 | Fill #5

## 2017-10-06 MED FILL — rOPINIRole HCL 0.5 MG TABS: 0.5 | 30 days supply | Qty: 60 | Fill #4

## 2017-10-06 MED FILL — metFORMIN HCL 1000 MG TABS: 1000 | 90 days supply | Qty: 180 | Fill #0

## 2017-10-13 DIAGNOSIS — H33321 Round hole, right eye: Secondary | ICD-10-CM | POA: Diagnosis not present

## 2017-10-13 DIAGNOSIS — E113293 Type 2 diabetes mellitus with mild nonproliferative diabetic retinopathy without macular edema, bilateral: Secondary | ICD-10-CM | POA: Diagnosis not present

## 2017-10-13 DIAGNOSIS — H40023 Open angle with borderline findings, high risk, bilateral: Secondary | ICD-10-CM | POA: Diagnosis not present

## 2017-10-13 DIAGNOSIS — H3562 Retinal hemorrhage, left eye: Secondary | ICD-10-CM | POA: Diagnosis not present

## 2017-10-13 DIAGNOSIS — H3582 Retinal ischemia: Secondary | ICD-10-CM | POA: Diagnosis not present

## 2017-10-19 ENCOUNTER — Other Ambulatory Visit: Payer: Self-pay | Admitting: Neurology

## 2017-10-20 ENCOUNTER — Other Ambulatory Visit: Payer: Self-pay | Admitting: Neurology

## 2017-10-20 MED ORDER — TRAMADOL-ACETAMINOPHEN 37.5-325 MG PO TABS: 1.0000 | ORAL_TABLET | Freq: Two times a day (BID) | ORAL | 2 refills | Status: DC | PRN

## 2017-10-20 MED FILL — TRULICITY 1.5 MG/0.5 ML PEN: 1.5 | 28 days supply | Qty: 2 | Fill #10

## 2017-10-20 MED FILL — TRAMADOL-ACETAMINOPHN 37.5-: 37.5-325 | 30 days supply | Qty: 60 | Fill #0

## 2017-11-03 MED FILL — SERTRALINE HCL 50 MG TABLET: 50 | 90 days supply | Qty: 90 | Fill #1

## 2017-11-03 MED FILL — rOPINIRole HCL 0.5 MG TABS: 0.5 | 30 days supply | Qty: 60 | Fill #5

## 2017-11-03 MED FILL — BETASERON 0.3 MG KIT: 0.3 | 28 days supply | Qty: 14 | Fill #9

## 2017-11-05 MED FILL — ATORVASTATIN 20 MG TABLET: 20 | 90 days supply | Qty: 90 | Fill #3

## 2017-11-26 MED FILL — TRULICITY 1.5 MG/0.5 ML PEN: 1.5 | 28 days supply | Qty: 2 | Fill #11

## 2017-11-26 MED FILL — BETASERON 0.3 MG KIT: 0.3 | 28 days supply | Qty: 14 | Fill #10

## 2017-11-26 MED FILL — TRAMADOL-ACETAMINOPHN 37.5-: 37.5-325 | 30 days supply | Qty: 60 | Fill #1

## 2017-11-29 MED FILL — LORazepam 0.5 MG TABS: 0.5 | 30 days supply | Qty: 30 | Fill #1

## 2017-11-29 MED FILL — rOPINIRole HCL 0.5 MG TABS: 0.5 | 30 days supply | Qty: 60 | Fill #0

## 2017-12-14 DIAGNOSIS — N182 Chronic kidney disease, stage 2 (mild): Secondary | ICD-10-CM | POA: Diagnosis not present

## 2017-12-14 DIAGNOSIS — E1121 Type 2 diabetes mellitus with diabetic nephropathy: Secondary | ICD-10-CM | POA: Diagnosis not present

## 2017-12-14 DIAGNOSIS — E113393 Type 2 diabetes mellitus with moderate nonproliferative diabetic retinopathy without macular edema, bilateral: Secondary | ICD-10-CM | POA: Diagnosis not present

## 2017-12-14 DIAGNOSIS — I10 Essential (primary) hypertension: Secondary | ICD-10-CM | POA: Diagnosis not present

## 2017-12-15 ENCOUNTER — Ambulatory Visit: Payer: 59 | Admitting: Adult Health

## 2017-12-17 MED FILL — FLUOCINONIDE 0.05% CREAM: 0.05 | 20 days supply | Qty: 60 | Fill #0

## 2017-12-20 MED FILL — TRULICITY 1.5 MG/0.5 ML PEN: 1.5 | 28 days supply | Qty: 2 | Fill #0

## 2017-12-20 MED FILL — LISINOPRIL 10 MG TABS: 10 | 90 days supply | Qty: 90 | Fill #3

## 2017-12-29 MED FILL — rOPINIRole HCL 0.5 MG TABS: 0.5 | 30 days supply | Qty: 60 | Fill #1

## 2017-12-30 ENCOUNTER — Other Ambulatory Visit: Payer: Self-pay | Admitting: Neurology

## 2017-12-30 MED ORDER — BETASERON 0.3 MG ~~LOC~~ KIT: PACK | SUBCUTANEOUS | 1 refills | Status: DC

## 2017-12-30 NOTE — Telephone Encounter (Signed)
Pt called she needs refill for BETASERON 0.3 MG KIT injection and it needs PA she was advised by [place]. She has enough for 3 days.

## 2017-12-30 NOTE — Telephone Encounter (Signed)
PA completed on cover my meds (Key: [id number]). Awaiting a determination.

## 2017-12-30 NOTE — Telephone Encounter (Signed)
I have sent in the refill order for the patient, a request for PA has not come through yet but we will keep an eye out for PA. Per our records her PA should be good until 01/19/18.

## 2018-01-03 DIAGNOSIS — Z0289 Encounter for other administrative examinations: Secondary | ICD-10-CM

## 2018-01-03 MED FILL — metFORMIN HCL 1000 MG TABS: 1000 | 90 days supply | Qty: 180 | Fill #1

## 2018-01-04 ENCOUNTER — Telehealth: Payer: Self-pay | Admitting: *Deleted

## 2018-01-04 ENCOUNTER — Telehealth: Payer: Self-pay | Admitting: Neurology

## 2018-01-04 NOTE — Telephone Encounter (Signed)
Pt [employer] form faxed on 01/04/18 to [phone]

## 2018-01-04 NOTE — Telephone Encounter (Signed)
PA was completed for the pt for Betaseron. PA approved from [employer] from 01/03/2018-01/02/2019. Approved for 14 vials per 28 days.  REF NUMBER: [id number]

## 2018-01-05 MED FILL — BETASERON 0.3 MG KIT: 0.3 | 28 days supply | Qty: 14 | Fill #11

## 2018-01-12 MED FILL — TRULICITY 1.5 MG/0.5 ML PEN: 1.5 | 28 days supply | Qty: 2 | Fill #1

## 2018-01-13 MED FILL — TRAMADOL-ACETAMINOPHN 37.5-: 37.5-325 | 30 days supply | Qty: 60 | Fill #2

## 2018-01-18 ENCOUNTER — Encounter: Payer: Self-pay | Admitting: Adult Health

## 2018-01-18 NOTE — Telephone Encounter (Signed)
Has appt 02-02-18 at 0800.

## 2018-01-25 ENCOUNTER — Other Ambulatory Visit: Payer: Self-pay | Admitting: Neurology

## 2018-01-25 MED FILL — rOPINIRole HCL 0.5 MG TABS: 0.5 | 30 days supply | Qty: 60 | Fill #2

## 2018-01-25 MED FILL — SERTRALINE HCL 50 MG TABLET: 50 | 90 days supply | Qty: 90 | Fill #0

## 2018-01-25 MED FILL — ATORVASTATIN 20 MG TABLET: 20 | 90 days supply | Qty: 90 | Fill #0

## 2018-01-26 ENCOUNTER — Other Ambulatory Visit: Payer: Self-pay | Admitting: Neurology

## 2018-01-26 ENCOUNTER — Other Ambulatory Visit: Payer: Self-pay | Admitting: Pharmacist

## 2018-01-26 MED ORDER — BETASERON 0.3 MG ~~LOC~~ KIT: PACK | SUBCUTANEOUS | 5 refills | Status: DC

## 2018-01-26 MED FILL — BETASERON 0.3 MG KIT: 0.3 | 28 days supply | Qty: 14 | Fill #0

## 2018-01-31 ENCOUNTER — Encounter: Payer: Self-pay | Admitting: Adult Health

## 2018-02-01 ENCOUNTER — Telehealth: Payer: Self-pay | Admitting: Pharmacist

## 2018-02-01 NOTE — Telephone Encounter (Signed)
Called patient to schedule an appointment for the [facility]. I was unable to reach the patient so I left a HIPAA-compliant message requesting that the patient return my call.   

## 2018-02-02 ENCOUNTER — Ambulatory Visit: Payer: 59 | Admitting: Adult Health

## 2018-02-02 ENCOUNTER — Encounter: Payer: Self-pay | Admitting: Adult Health

## 2018-02-02 ENCOUNTER — Ambulatory Visit (HOSPITAL_BASED_OUTPATIENT_CLINIC_OR_DEPARTMENT_OTHER): Payer: 59 | Admitting: Pharmacist

## 2018-02-02 VITALS — BP 128/78 | HR 84 | Wt 195.0 lb

## 2018-02-02 DIAGNOSIS — Z9989 Dependence on other enabling machines and devices: Secondary | ICD-10-CM | POA: Diagnosis not present

## 2018-02-02 DIAGNOSIS — F39 Unspecified mood [affective] disorder: Secondary | ICD-10-CM | POA: Diagnosis not present

## 2018-02-02 DIAGNOSIS — G35 Multiple sclerosis: Secondary | ICD-10-CM | POA: Diagnosis not present

## 2018-02-02 DIAGNOSIS — G2581 Restless legs syndrome: Secondary | ICD-10-CM

## 2018-02-02 DIAGNOSIS — G4733 Obstructive sleep apnea (adult) (pediatric): Secondary | ICD-10-CM | POA: Diagnosis not present

## 2018-02-02 DIAGNOSIS — Z79899 Other long term (current) drug therapy: Secondary | ICD-10-CM

## 2018-02-02 MED ORDER — SERTRALINE HCL 50 MG PO TABS: 100.0000 mg | ORAL_TABLET | Freq: Every day | ORAL | 3 refills | Status: DC

## 2018-02-02 MED ORDER — ROPINIROLE HCL 1 MG PO TABS: 1.0000 mg | ORAL_TABLET | Freq: Every day | ORAL | 5 refills | Status: DC

## 2018-02-02 NOTE — Progress Notes (Signed)
I agree with the assessment and plan as directed by NP .The patient is known to me .   Channie Bostick, MD  

## 2018-02-02 NOTE — Patient Instructions (Signed)
Your Plan:  Continue Betaseron Blood work today  Can take requip 1-3 mg at bedtime If your symptoms worsen or you develop new symptoms please let us know.   Thank you for coming to see [place] at [facility]. I hope we have been able to provide you high quality care today.  You may receive a patient satisfaction survey over the next few weeks. We would appreciate your feedback and comments so that we may continue to improve ourselves and the health of our patients.

## 2018-02-02 NOTE — Progress Notes (Signed)
I agree with the assessment and plan as directed by NP .The patient is known to me .   Jaleeyah Munce, MD  

## 2018-02-02 NOTE — Progress Notes (Signed)
PATIENT: Erica Powell DOB: [birth year]  REASON FOR VISIT: follow up-multiple for this, with his leg syndrome HISTORY FROM: patient  HISTORY OF PRESENT ILLNESS: Today 02/02/18 Erica Powell is a 52 year old female with a history of multiple sclerosis and restless leg syndrome.  She returns today for follow-up.  She continues on Betaseron and is tolerating it well.  She denies any numbness or tingling.  Denies any significant changes with her gait or balance.  She states that when she initially stands after sitting for a long period of time she does feel a little stiff but this quickly subsides.  She denies any new changes in regards to her bowels or bladder.  She states that she continues to have some incontinence with her bladder.  Her biggest concern today is restless legs.  She states that she typically takes Requip 1 mg at bedtime but there are some nights that this does not control her symptoms therefore she will increase her dose to 2 mg in the increase in medication also does not give her any benefit.  She does state that there are some nights that the 1 mg works well for her.  She states that she has tried Klonopin and lorazepam but is unable to tolerate these medications.  We discussed gabapentin but she reports that she will not try this medication due to potential side effects.  She continues on Vistaril for itching.  She reports that Zoloft works well for her although she has increased to 100 mg.  She states that she continues to take Ultracet approximately 2 tablets a day for back pain and leg jerking.  She returns today for evaluation.  HISTORY [name] is returning today for a six-month revisit she had over the last 6 months taking an increased dose of Requip which has not helped her restless legs. She has some myoclonic jerking supple movements in both lower extremities, but mostly on the right. It starts as a painful spasm and then releases suddenly. It is very bothersome. The therapy  works has not relieved the distress either. She has started dropping things more than usual, she may drop something right out of her hands. She has sometimes double vision, when she gets fatigue her left eye abducts to the center creating a horizontal diplopia. She also states that she has some cognitive problems forgets what she is doing, sometimes she has to talk to her legs to move, the jerks have gotten worse sometimes it banged up against the desk or buckle and she is walking. In the morning however she is stiff as well as when she stands for long periods at a time or sitting for longer time at the computer. She also has urinary problems. She sweats profusely when she mops, does homework housework physical work it is not like a hot flash. She wonders if it could be a mass. She states that she feels " pretty much like crap all the time not just the normal tiredness".  REVIEW OF SYSTEMS: Out of a complete 14 system review of symptoms, the patient complains only of the following symptoms, and all other reviewed systems are negative.  See HPI  ALLERGIES: Allergies  Allergen Reactions  . Cephalosporins Itching and Swelling    Occurs with cephalexin (Keflex). Starts with prickling around the face, swelling/numb feeling of face.  . Hydrochlorothiazide Itching and Swelling    Swelling of the face, hives, itching, feeling of bad sunburn.   . Penicillins Itching and Swelling  Occurs with Augmentin and amoxicillin. Hives, prickling, swelling of face.   . Sulfa Antibiotics Itching and Swelling    Itching, hives, swelling of face  . Baclofen Other (See Comments)    Leg spasms, jittery, heart racing   . Clonazepam     Males me mean  . Lorazepam Other (See Comments)    "makes me real short-tempered, mean"    HOME MEDICATIONS: Outpatient Medications Prior to Visit  Medication Sig Dispense Refill  . acetaminophen (TYLENOL) 500 MG tablet Take 2 tablets (1,000 mg total) by mouth every other day.  Prior to Betaseron injection.    [name] aspirin 81 MG tablet Take 1 tablet (81 mg total) by mouth daily.    [name] atorvastatin (LIPITOR) 20 MG tablet Take 1 tablet by mouth daily.  3  . BETASERON 0.3 MG KIT injection INJECT 0.3 MGS INTO THE SKIN EVERY OTHER DAY 14 each 5  . cholecalciferol (VITAMIN D) 1000 UNITS tablet Take 1,000 Units by mouth daily.    . Dulaglutide (TRULICITY) 9.38 BO/1.7PZ SOPN Inject 1.5 mg into the skin once a week.     . ferrous sulfate 325 (65 FE) MG tablet Take 325 mg by mouth daily with breakfast.    . hydrOXYzine (ATARAX/VISTARIL) 10 MG tablet TAKE 1 TABLET BY MOUTH 3 TIMES DAILY AS NEEDED. 90 tablet 1  . lisinopril (PRINIVIL,ZESTRIL) 10 MG tablet Take 1 tablet (10 mg total) by mouth daily.    . Melatonin 10 MG TABS Take 10 mg by mouth at bedtime.    . metFORMIN (GLUCOPHAGE) 1000 MG tablet Take 1 tablet (1,000 mg total) by mouth 2 (two) times daily with a meal.    . rOPINIRole (REQUIP) 0.5 MG tablet Take 2 tablets (1 mg total) by mouth at bedtime. 60 tablet 5  . sertraline (ZOLOFT) 50 MG tablet TAKE 1 TABLET BY MOUTH DAILY. 90 tablet 1  . traMADol-acetaminophen (ULTRACET) 37.5-325 MG tablet Take 1 tablet 2 (two) times daily as needed by mouth. 60 tablet 2  . vitamin B-12 (CYANOCOBALAMIN) 1000 MCG tablet Take 1,000 mcg by mouth daily.    [name] LORazepam (ATIVAN) 0.5 MG tablet Take 1 tablet (0.5 mg total) by mouth at bedtime. 30 tablet 5   No facility-administered medications prior to visit.     PAST MEDICAL HISTORY: Past Medical History:  Diagnosis Date  . Anemia   . Diabetes mellitus   . Hyperlipemia   . Hypertension   . Multiple sclerosis ([place])   . RLS (restless legs syndrome)   . Type 2 diabetes mellitus without (mention of) complications     PAST SURGICAL HISTORY: Past Surgical History:  Procedure Laterality Date  . BONE CYST EXCISION    . ESSURE TUBAL LIGATION    . WISDOM TOOTH EXTRACTION  1985    FAMILY HISTORY: Family History  Problem Relation Age of  Onset  . Stroke Mother   . Lupus Sister   . Cancer Maternal Uncle        bone cancer    SOCIAL HISTORY: Social History   Socioeconomic History  . Marital status: Single    Spouse name: Not on file  . Number of children: 0  . Years of education: Bachelor's  . Highest education level: Not on file  Social Needs  . Financial resource strain: Not on file  . Food insecurity - worry: Not on file  . Food insecurity - inability: Not on file  . Transportation needs - medical: Not on file  . Transportation needs -  non-medical: Not on file  Occupational History  . Occupation: Psychiatrist: [employer]   Tobacco Use  . Smoking status: Never Smoker  . Smokeless tobacco: Never Used  Substance and Sexual Activity  . Alcohol use: No  . Drug use: No  . Sexual activity: Not on file  Other Topics Concern  . Not on file  Social History Narrative   Patient is single with no children   Patient is left handed   Patient has a Bachelor's degree   Patient drinks 20 oz daily      PHYSICAL EXAM  Vitals:   02/02/18 0737  BP: 128/78  Pulse: 84  Weight: 195 lb (88.5 kg)   Body mass index is 32.45 kg/m.  Generalized: Well developed, in no acute distress   Neurological examination  Mentation: Alert oriented to time, place, history taking. Follows all commands speech and language fluent Cranial nerve II-XII: Pupils were equal round reactive to light. Extraocular movements were full, visual field were full on confrontational test. Facial sensation and strength were normal. Uvula tongue midline. Head turning and shoulder shrug  were normal and symmetric. Motor: The motor testing reveals 5 over 5 strength of all 4 extremities. Good symmetric motor tone is noted throughout.  Sensory: Sensory testing is intact to soft touch on all 4 extremities. No evidence of extinction is noted.  Coordination: Cerebellar testing reveals good finger-nose-finger and heel-to-shin bilaterally.    Gait and station: Gait is normal. Tandem gait is normal. Romberg is negative. No drift is seen.  Reflexes: Deep tendon reflexes are symmetric and normal bilaterally.   DIAGNOSTIC DATA (LABS, IMAGING, TESTING) - I reviewed patient records, labs, notes, testing and imaging myself where available.  Lab Results  Component Value Date   WBC 6.6 12/29/2016   HGB 12.3 12/29/2016   HCT 35.7 12/29/2016   MCV 88 12/29/2016   PLT 308 12/29/2016      Component Value Date/Time   NA 137 12/29/2016 1359   K 4.0 12/29/2016 1359   CL 99 12/29/2016 1359   CO2 20 12/29/2016 1359   GLUCOSE 152 (H) 12/29/2016 1359   BUN 9 12/29/2016 1359   CREATININE 0.43 (L) 12/29/2016 1359   CALCIUM 9.1 12/29/2016 1359   PROT 6.7 12/29/2016 1359   ALBUMIN 4.2 12/29/2016 1359   AST 17 12/29/2016 1359   ALT 20 12/29/2016 1359   ALKPHOS 57 12/29/2016 1359   BILITOT <0.2 12/29/2016 1359   GFRNONAA 119 12/29/2016 1359   GFRAA 137 12/29/2016 1359      ASSESSMENT AND PLAN 52 y.o. year old female  has a past medical history of Anemia, Diabetes mellitus, Hyperlipemia, Hypertension, Multiple sclerosis ([place]), RLS (restless legs syndrome), and Type 2 diabetes mellitus without (mention of) complications. here with:  1.  Multiple sclerosis 2.  Restless leg syndrome 3. Mood disorder  The patient will continue on Betaseron.  I will check blood work today.  She had imaging at the last visit.  In regards to restless legs we discussed potentially switching her medication to Mirapex however at this time she does not want to try a new medication.  I advised that she can take Requip 1-3 mg at bedtime.  We reviewed potential side effects of Requip.  She will continue on Vistaril, Zoloft and Ultracet.  She is advised that if her symptoms worsen or she develops new symptoms she should let us know.  She will follow-up in 6 months or sooner if needed.[name]  [name], MSN, NP-C 02/02/2018, 7:41 AM [facility] [address] [place], [place] [address] [phone]

## 2018-02-02 NOTE — Progress Notes (Signed)
S: Patient presents today to the [facility] for review of her specialty medication.  Patient is currently taking Betaseron for MS. Patient is managed by Dr. Osvaldo Shipper for this. She reports that she saw neurology today and doing very well. She reports some difficulty getting it covered by insurance but she got a letter of necessity and has been approved to continue.   Adherence: denies any missed doses.  Dosing:  0.3 mg every other day.  Drug-drug interactions:none  Monitoring: CBC: pending - gotten today at neurology LFTs: pending Thyroid function tests: WNL Flu-like symptoms: denies and takes pre-treatment acetaminophen to prevent  Neuropsychiatric symptoms: denies Injection-site reactions: yes but manages appropriately MRI: no changes to MS (last scan 07/07/17)  O:     Lab Results  Component Value Date   WBC 6.6 12/29/2016   HGB 12.3 12/29/2016   HCT 35.7 12/29/2016   MCV 88 12/29/2016   PLT 308 12/29/2016      Chemistry      Component Value Date/Time   NA 137 12/29/2016 1359   K 4.0 12/29/2016 1359   CL 99 12/29/2016 1359   CO2 20 12/29/2016 1359   BUN 9 12/29/2016 1359   CREATININE 0.43 (L) 12/29/2016 1359      Component Value Date/Time   CALCIUM 9.1 12/29/2016 1359   ALKPHOS 57 12/29/2016 1359   AST 17 12/29/2016 1359   ALT 20 12/29/2016 1359   BILITOT <0.2 12/29/2016 1359       A/P: 1. Medication review: Patient is tolerating Betaseron well and denies any adverse effects. MS has been stable on betaseron. Reviewed the medication with the patient. Betaseron, interferon beta-1b, is an interferon indicated for the treatment of MS. Analgesics and/or antipyretics may help decrease flu-like symptoms on treatment days and patient is doing this. Other adverse effects of this medication are bone marrow suppression, injection-site reactions, and neuropsychiatric disorders. No s/sx of this. It can increase  the risk of new onset cardiomyopathy or heart failure. No s/sx of this. No recommendations for any changes.   Alvino Blood, PharmD, BCPS, BCACP, CPP Clinical Pharmacist Practitioner  [facility] [phone]

## 2018-02-03 ENCOUNTER — Telehealth: Payer: Self-pay | Admitting: *Deleted

## 2018-02-03 LAB — CBC WITH DIFFERENTIAL/PLATELET
BASOS ABS: 0 10*3/uL (ref 0.0–0.2)
Basos: 1 %
EOS (ABSOLUTE): 0.2 10*3/uL (ref 0.0–0.4)
Eos: 3 %
Hematocrit: 33.9 % — ABNORMAL LOW (ref 34.0–46.6)
Hemoglobin: 11 g/dL — ABNORMAL LOW (ref 11.1–15.9)
IMMATURE GRANS (ABS): 0 10*3/uL (ref 0.0–0.1)
Immature Granulocytes: 0 %
LYMPHS: 45 %
Lymphocytes Absolute: 2.8 10*3/uL (ref 0.7–3.1)
MCH: 29.6 pg (ref 26.6–33.0)
MCHC: 32.4 g/dL (ref 31.5–35.7)
MCV: 91 fL (ref 79–97)
Monocytes Absolute: 0.5 10*3/uL (ref 0.1–0.9)
Monocytes: 8 %
NEUTROS PCT: 43 %
Neutrophils Absolute: 2.8 10*3/uL (ref 1.4–7.0)
PLATELETS: 356 10*3/uL (ref 150–379)
RBC: 3.71 x10E6/uL — AB (ref 3.77–5.28)
RDW: 14.3 % (ref 12.3–15.4)
WBC: 6.3 10*3/uL (ref 3.4–10.8)

## 2018-02-03 LAB — COMPREHENSIVE METABOLIC PANEL
ALK PHOS: 64 IU/L (ref 39–117)
ALT: 20 IU/L (ref 0–32)
AST: 15 IU/L (ref 0–40)
Albumin/Globulin Ratio: 1.6 (ref 1.2–2.2)
Albumin: 4.2 g/dL (ref 3.5–5.5)
BUN/Creatinine Ratio: 24 — ABNORMAL HIGH (ref 9–23)
BUN: 11 mg/dL (ref 6–24)
CHLORIDE: 100 mmol/L (ref 96–106)
CO2: 24 mmol/L (ref 20–29)
Calcium: 9.4 mg/dL (ref 8.7–10.2)
Creatinine, Ser: 0.46 mg/dL — ABNORMAL LOW (ref 0.57–1.00)
GFR calc Af Amer: 133 mL/min/{1.73_m2} (ref >59)
GFR calc non Af Amer: 116 mL/min/{1.73_m2} (ref >59)
GLUCOSE: 194 mg/dL — AB (ref 65–99)
Globulin, Total: 2.6 g/dL (ref 1.5–4.5)
Potassium: 4 mmol/L (ref 3.5–5.2)
Sodium: 137 mmol/L (ref 134–144)
TOTAL PROTEIN: 6.8 g/dL (ref 6.0–8.5)

## 2018-02-03 NOTE — Telephone Encounter (Signed)
LVM informing patient that her blood work is relatively unremarkable. Red blood cell, hemoglobin and hematocrit are only mildly decreased. Advised her a copy will be forwarded to her to her primary care provider, Dr Valentina Lucks. Left number for any questions.

## 2018-02-09 MED FILL — TRULICITY 1.5 MG/0.5 ML PEN: 1.5 | 28 days supply | Qty: 2 | Fill #2

## 2018-02-10 MED FILL — rOPINIRole HCL 1 MG TABS: 1 | 30 days supply | Qty: 90 | Fill #0

## 2018-02-23 MED FILL — BETASERON 0.3 MG KIT: 0.3 | 28 days supply | Qty: 14 | Fill #1

## 2018-03-09 ENCOUNTER — Other Ambulatory Visit: Payer: Self-pay | Admitting: Neurology

## 2018-03-09 MED FILL — TRULICITY 1.5 MG/0.5 ML PEN: 1.5 | 28 days supply | Qty: 2 | Fill #3

## 2018-03-09 MED FILL — TRAMADOL-ACETAMINOPHN 37.5-: 37.5-325 | 30 days supply | Qty: 60 | Fill #0

## 2018-03-09 MED FILL — rOPINIRole HCL 1 MG TABS: 1 | 30 days supply | Qty: 90 | Fill #1

## 2018-03-23 MED FILL — SERTRALINE HCL 50 MG TABLET: 50 | 90 days supply | Qty: 180 | Fill #0

## 2018-03-23 MED FILL — BETASERON 0.3 MG KIT: 0.3 | 28 days supply | Qty: 14 | Fill #2

## 2018-03-24 MED FILL — LISINOPRIL 10 MG TABLET: 10 | 90 days supply | Qty: 90 | Fill #0

## 2018-04-08 MED FILL — metFORMIN HCL 1000 MG TABS: 1000 | 90 days supply | Qty: 180 | Fill #2

## 2018-04-08 MED FILL — hydrOXYzine HCL 10 MG TABS: 10 | 30 days supply | Qty: 90 | Fill #1

## 2018-04-08 MED FILL — rOPINIRole HCL 1 MG TABS: 1 | 30 days supply | Qty: 90 | Fill #2

## 2018-04-08 MED FILL — TRULICITY 1.5 MG/0.5 ML PEN: 1.5 | 28 days supply | Qty: 2 | Fill #4

## 2018-04-11 MED FILL — TRAMADOL-ACETAMINOPHN 37.5-: 37.5-325 | 30 days supply | Qty: 60 | Fill #1

## 2018-04-12 DIAGNOSIS — I10 Essential (primary) hypertension: Secondary | ICD-10-CM | POA: Diagnosis not present

## 2018-04-12 DIAGNOSIS — E1169 Type 2 diabetes mellitus with other specified complication: Secondary | ICD-10-CM | POA: Diagnosis not present

## 2018-04-18 MED FILL — ATORVASTATIN 20 MG TABLET: 20 | 90 days supply | Qty: 90 | Fill #1

## 2018-04-19 DIAGNOSIS — H40023 Open angle with borderline findings, high risk, bilateral: Secondary | ICD-10-CM | POA: Diagnosis not present

## 2018-04-19 DIAGNOSIS — H5213 Myopia, bilateral: Secondary | ICD-10-CM | POA: Diagnosis not present

## 2018-04-19 DIAGNOSIS — E113293 Type 2 diabetes mellitus with mild nonproliferative diabetic retinopathy without macular edema, bilateral: Secondary | ICD-10-CM | POA: Diagnosis not present

## 2018-04-19 DIAGNOSIS — H35033 Hypertensive retinopathy, bilateral: Secondary | ICD-10-CM | POA: Diagnosis not present

## 2018-04-19 DIAGNOSIS — H3582 Retinal ischemia: Secondary | ICD-10-CM | POA: Diagnosis not present

## 2018-04-19 DIAGNOSIS — H3562 Retinal hemorrhage, left eye: Secondary | ICD-10-CM | POA: Diagnosis not present

## 2018-04-19 MED FILL — BETASERON 0.3 MG KIT: 0.3 | 28 days supply | Qty: 14 | Fill #3

## 2018-05-04 MED FILL — TRULICITY 1.5 MG/0.5 ML PEN: 1.5 | 28 days supply | Qty: 2 | Fill #5

## 2018-05-18 MED FILL — rOPINIRole HCL 1 MG TABS: 1 | 30 days supply | Qty: 90 | Fill #3

## 2018-05-18 MED FILL — TRAMADOL-ACETAMINOPHN 37.5-: 37.5-325 | 30 days supply | Qty: 60 | Fill #2

## 2018-05-18 MED FILL — BETASERON 0.3 MG KIT: 0.3 | 28 days supply | Qty: 14 | Fill #4

## 2018-06-14 MED FILL — BETASERON 0.3 MG KIT: 0.3 | 28 days supply | Qty: 14 | Fill #5

## 2018-06-14 MED FILL — TRULICITY 1.5 MG/0.5 ML PEN: 1.5 | 28 days supply | Qty: 2 | Fill #6

## 2018-06-14 MED FILL — rOPINIRole HCL 1 MG TABS: 1 | 30 days supply | Qty: 90 | Fill #4

## 2018-06-14 MED FILL — LISINOPRIL 10 MG TABLET: 10 | 90 days supply | Qty: 90 | Fill #1

## 2018-06-14 MED FILL — SERTRALINE HCL 50 MG TABLET: 50 | 90 days supply | Qty: 180 | Fill #1

## 2018-06-24 MED FILL — FREESTYLE LITE METER: 1 days supply | Qty: 1 | Fill #0

## 2018-06-24 MED FILL — FREESTYLE LITE TEST STRIP: 90 days supply | Qty: 200 | Fill #0

## 2018-06-24 MED FILL — FREESTYLE LANCETS: 90 days supply | Qty: 200 | Fill #0

## 2018-06-29 ENCOUNTER — Other Ambulatory Visit: Payer: Self-pay | Admitting: Neurology

## 2018-06-29 MED FILL — TRAMADOL-ACETAMINOPHN 37.5-: 37.5-325 | 30 days supply | Qty: 60 | Fill #0

## 2018-06-29 NOTE — Telephone Encounter (Signed)
Rx registry checked. Last fill date is 05/18/18 for #60. Last OV was 02/02/18 and next OV is 08/03/18.

## 2018-06-30 MED FILL — metFORMIN HCL 1000 MG TABS: 1000 | 90 days supply | Qty: 180 | Fill #3

## 2018-07-13 MED FILL — ATORVASTATIN CALCIUM 20 MG: 20 | 90 days supply | Qty: 90 | Fill #2

## 2018-07-13 MED FILL — FLUOCINONIDE 0.05% CREAM: 0.05 | 20 days supply | Qty: 60 | Fill #1

## 2018-07-13 MED FILL — TRULICITY 1.5 MG/0.5 ML PEN: 1.5 | 28 days supply | Qty: 2 | Fill #7

## 2018-07-13 MED FILL — rOPINIRole HCL 1 MG TABS: 1 | 30 days supply | Qty: 90 | Fill #5

## 2018-07-19 ENCOUNTER — Telehealth: Payer: Self-pay | Admitting: Adult Health

## 2018-07-19 ENCOUNTER — Other Ambulatory Visit: Payer: Self-pay | Admitting: Pharmacist

## 2018-07-19 MED ORDER — BETASERON 0.3 MG ~~LOC~~ KIT: PACK | SUBCUTANEOUS | 5 refills | Status: DC

## 2018-07-19 MED FILL — BETASERON 0.3 MG KIT: 0.3 | 28 days supply | Qty: 14 | Fill #0

## 2018-07-19 NOTE — Telephone Encounter (Signed)
Reviewed patient's chart. Patient saw Tylene Fantasia NP on 02/02/2018 and no changes were made to the Betaseron injection. Patient has an appointment scheduled with NP on 08/01/2018. Prescription submitted with 2 refills.

## 2018-07-19 NOTE — Telephone Encounter (Signed)
[facility] pharmacy [phone] request refill for BETASERON 0.3 MG KIT injection. She has tried to fax this numerous times over the past week to [phone] and [phone]. I gave her fax number [phone]. The patient needs this today.

## 2018-08-02 MED FILL — TRAMADOL-ACETAMINOPHN 37.5-: 37.5-325 | 30 days supply | Qty: 60 | Fill #1

## 2018-08-03 ENCOUNTER — Ambulatory Visit: Payer: 59 | Admitting: Adult Health

## 2018-08-03 ENCOUNTER — Encounter: Payer: Self-pay | Admitting: Adult Health

## 2018-08-03 VITALS — BP 119/73 | HR 75 | Ht 65.0 in | Wt 191.0 lb

## 2018-08-03 DIAGNOSIS — G35 Multiple sclerosis: Secondary | ICD-10-CM | POA: Diagnosis not present

## 2018-08-03 DIAGNOSIS — G2581 Restless legs syndrome: Secondary | ICD-10-CM | POA: Diagnosis not present

## 2018-08-03 DIAGNOSIS — Z5181 Encounter for therapeutic drug level monitoring: Secondary | ICD-10-CM

## 2018-08-03 MED ORDER — ROPINIROLE HCL 1 MG PO TABS: 1.0000 mg | ORAL_TABLET | Freq: Every day | ORAL | 5 refills | Status: DC

## 2018-08-03 NOTE — Progress Notes (Signed)
PATIENT: Erica Powell DOB: [birth year]  REASON FOR VISIT: follow up HISTORY FROM: patient  HISTORY OF PRESENT ILLNESS: Today 08/03/18:  Erica Powell is a 52 year old female with a history of multiple sclerosis and restless leg syndrome.  She returns today for follow-up.  She denies any new numbness or weakness.  She does state that if she is fatigued the weakness seems to be worse.  She denies any changes with her vision.  Reports that she continues to have urinary incontinence but denies any other changes with bowel or bladder.  In regards to her restless leg she typically takes Requip 1 mg in the morning and 2 mg at bedtime.  She states on occasion she has to take Ultracet at bedtime for restless legs.  She also has Vistaril for itching but reports that she does not have to take this often.  She continues on Zoloft.  Her last MRI in 2018 showed no new changes.  She returns today for evaluation.  HISTORY 02/02/18 Erica Powell is a 52 year old female with a history of multiple sclerosis and restless leg syndrome.  She returns today for follow-up.  She continues on Betaseron and is tolerating it well.  She denies any numbness or tingling.  Denies any significant changes with her gait or balance.  She states that when she initially stands after sitting for a long period of time she does feel a little stiff but this quickly subsides.  She denies any new changes in regards to her bowels or bladder.  She states that she continues to have some incontinence with her bladder.  Her biggest concern today is restless legs.  She states that she typically takes Requip 1 mg at bedtime but there are some nights that this does not control her symptoms therefore she will increase her dose to 2 mg in the increase in medication also does not give her any benefit.  She does state that there are some nights that the 1 mg works well for her.  She states that she has tried Klonopin and lorazepam but is unable to tolerate  these medications.  We discussed gabapentin but she reports that she will not try this medication due to potential side effects.  She continues on Vistaril for itching.  She reports that Zoloft works well for her although she has increased to 100 mg.  She states that she continues to take Ultracet approximately 2 tablets a day for back pain and leg jerking.  She returns today for evaluation.  REVIEW OF SYSTEMS: Out of a complete 14 system review of symptoms, the patient complains only of the following symptoms, and all other reviewed systems are negative.  ALLERGIES: Allergies  Allergen Reactions  . Cephalosporins Itching and Swelling    Occurs with cephalexin (Keflex). Starts with prickling around the face, swelling/numb feeling of face.  . Hydrochlorothiazide Itching and Swelling    Swelling of the face, hives, itching, feeling of bad sunburn.   . Penicillins Itching and Swelling    Occurs with Augmentin and amoxicillin. Hives, prickling, swelling of face.   . Sulfa Antibiotics Itching and Swelling    Itching, hives, swelling of face  . Baclofen Other (See Comments)    Leg spasms, jittery, heart racing   . Clonazepam     Males me mean  . Lorazepam Other (See Comments)    "makes me real short-tempered, mean"    HOME MEDICATIONS: Outpatient Medications Prior to Visit  Medication Sig Dispense Refill  . aspirin  81 MG tablet Take 1 tablet (81 mg total) by mouth daily.    [name] atorvastatin (LIPITOR) 20 MG tablet Take 1 tablet by mouth daily.  3  . BETASERON 0.3 MG KIT injection INJECT 0.3 MGS INTO THE SKIN EVERY OTHER DAY 14 each 5  . cholecalciferol (VITAMIN D) 1000 UNITS tablet Take 1,000 Units by mouth daily.    . Dulaglutide (TRULICITY) 8.65 HQ/4.6NG SOPN Inject 1.5 mg into the skin once a week.     . ferrous sulfate 325 (65 FE) MG tablet Take 325 mg by mouth daily with breakfast.    . hydrOXYzine (ATARAX/VISTARIL) 10 MG tablet TAKE 1 TABLET BY MOUTH 3 TIMES DAILY AS NEEDED. 90 tablet 1    . lisinopril (PRINIVIL,ZESTRIL) 10 MG tablet Take 1 tablet (10 mg total) by mouth daily.    . Melatonin 10 MG TABS Take 10 mg by mouth at bedtime.    . metFORMIN (GLUCOPHAGE) 1000 MG tablet Take 1 tablet (1,000 mg total) by mouth 2 (two) times daily with a meal.    . rOPINIRole (REQUIP) 1 MG tablet Take 1-3 tablets (1-3 mg total) by mouth at bedtime. 90 tablet 5  . sertraline (ZOLOFT) 50 MG tablet Take 2 tablets (100 mg total) by mouth daily. 180 tablet 3  . traMADol-acetaminophen (ULTRACET) 37.5-325 MG tablet TAKE 1 TABLET BY MOUTH TWICE DAILY AS NEEDED 60 tablet 2  . vitamin B-12 (CYANOCOBALAMIN) 1000 MCG tablet Take 1,000 mcg by mouth daily.    [name] acetaminophen (TYLENOL) 500 MG tablet Take 2 tablets (1,000 mg total) by mouth every other day. Prior to Betaseron injection.    . traMADol-acetaminophen (ULTRACET) 37.5-325 MG tablet Take 1 tablet 2 (two) times daily as needed by mouth. 60 tablet 2  . traMADol-acetaminophen (ULTRACET) 37.5-325 MG tablet TAKE 1 TABLET BY MOUTH TWICE DAILY AS NEEDED 60 tablet 2   No facility-administered medications prior to visit.     PAST MEDICAL HISTORY: Past Medical History:  Diagnosis Date  . Anemia   . Diabetes mellitus   . Hyperlipemia   . Hypertension   . Multiple sclerosis ([place])   . RLS (restless legs syndrome)   . Type 2 diabetes mellitus without (mention of) complications     PAST SURGICAL HISTORY: Past Surgical History:  Procedure Laterality Date  . BONE CYST EXCISION    . ESSURE TUBAL LIGATION    . WISDOM TOOTH EXTRACTION  1985    FAMILY HISTORY: Family History  Problem Relation Age of Onset  . Stroke Mother   . Lupus Sister   . Cancer Maternal Uncle        bone cancer    SOCIAL HISTORY: Social History   Socioeconomic History  . Marital status: Single    Spouse name: Not on file  . Number of children: 0  . Years of education: Bachelor's  . Highest education level: Not on file  Occupational History  . Occupation: Cytogeneticist: [employer]   Social Needs  . Financial resource strain: Not on file  . Food insecurity:    Worry: Not on file    Inability: Not on file  . Transportation needs:    Medical: Not on file    Non-medical: Not on file  Tobacco Use  . Smoking status: Never Smoker  . Smokeless tobacco: Never Used  Substance and Sexual Activity  . Alcohol use: No  . Drug use: No  . Sexual activity: Not on file  Lifestyle  .  Physical activity:    Days per week: Not on file    Minutes per session: Not on file  . Stress: Not on file  Relationships  . Social connections:    Talks on phone: Not on file    Gets together: Not on file    Attends religious service: Not on file    Active member of club or organization: Not on file    Attends meetings of clubs or organizations: Not on file    Relationship status: Not on file  . Intimate partner violence:    Fear of current or ex partner: Not on file    Emotionally abused: Not on file    Physically abused: Not on file    Forced sexual activity: Not on file  Other Topics Concern  . Not on file  Social History Narrative   Patient is single with no children   Patient is left handed   Patient has a Bachelor's degree   Patient drinks 20 oz daily      PHYSICAL EXAM  Vitals:   08/03/18 0807  BP: 119/73  Pulse: 75  Weight: 191 lb (86.6 kg)  Height: '5\' 5"'  (1.651 m)   Body mass index is 31.78 kg/m.  Generalized: Well developed, in no acute distress   Neurological examination  Mentation: Alert oriented to time, place, history taking. Follows all commands speech and language fluent Cranial nerve II-XII: Pupils were equal round reactive to light. Extraocular movements were full, visual field were full on confrontational test. Facial sensation and strength were normal. Uvula tongue midline. Head turning and shoulder shrug  were normal and symmetric. Motor: The motor testing reveals 5 over 5 strength of all 4 extremities. Good  symmetric motor tone is noted throughout.  Sensory: Sensory testing is intact to soft touch on all 4 extremities. No evidence of extinction is noted.  Coordination: Cerebellar testing reveals good finger-nose-finger and heel-to-shin bilaterally.  Gait and station: Gait is normal. Tandem gait is normal. Romberg is negative. No drift is seen.  Reflexes: Deep tendon reflexes are symmetric and normal bilaterally.   DIAGNOSTIC DATA (LABS, IMAGING, TESTING) - I reviewed patient records, labs, notes, testing and imaging myself where available.  Lab Results  Component Value Date   WBC 6.3 02/02/2018   HGB 11.0 (L) 02/02/2018   HCT 33.9 (L) 02/02/2018   MCV 91 02/02/2018   PLT 356 02/02/2018      Component Value Date/Time   NA 137 02/02/2018 0823   K 4.0 02/02/2018 0823   CL 100 02/02/2018 0823   CO2 24 02/02/2018 0823   GLUCOSE 194 (H) 02/02/2018 0823   BUN 11 02/02/2018 0823   CREATININE 0.46 (L) 02/02/2018 0823   CALCIUM 9.4 02/02/2018 0823   PROT 6.8 02/02/2018 0823   ALBUMIN 4.2 02/02/2018 0823   AST 15 02/02/2018 0823   ALT 20 02/02/2018 0823   ALKPHOS 64 02/02/2018 0823   BILITOT <0.2 02/02/2018 0823   GFRNONAA 116 02/02/2018 0823   GFRAA 133 02/02/2018 0823   No results found for: CHOL, HDL, LDLCALC, LDLDIRECT, TRIG, CHOLHDL No results found for: HGBA1C No results found for: VITAMINB12 No results found for: TSH    ASSESSMENT AND PLAN 52 y.o. year old female  has a past medical history of Anemia, Diabetes mellitus, Hyperlipemia, Hypertension, Multiple sclerosis ([place]), RLS (restless legs syndrome), and Type 2 diabetes mellitus without (mention of) complications. here with:  1.  Multiple sclerosis 2.  Restless leg syndrome  Overall patient is  doing well.  She will continue on Betaseron.  I will check blood work today.  She will continue on Requip 1 mg in the morning and 2 mg at bedtime.  She will continue to use Ultracet for restless legs as needed.  She will continue on  Zoloft.  She we will use Vistaril as needed for itching.  She is advised that if her symptoms worsen or she develops new symptoms she should let us know.  He will follow-up in 6 months or sooner if needed.      [name], MSN, NP-C 08/03/2018, 8:29 AM [facility] [address], [place],  [address] [phone]

## 2018-08-03 NOTE — Patient Instructions (Addendum)
Your Plan:  Continue Betaseron, vistaril, Zoloft, Requip and ultracet Blood work today If your symptoms worsen or you develop new symptoms please let us know.   Thank you for coming to see [place] at [facility]. I hope we have been able to provide you high quality care today.  You may receive a patient satisfaction survey over the next few weeks. We would appreciate your feedback and comments so that we may continue to improve ourselves and the health of our patients.

## 2018-08-04 ENCOUNTER — Telehealth: Payer: Self-pay | Admitting: *Deleted

## 2018-08-04 DIAGNOSIS — G35 Multiple sclerosis: Secondary | ICD-10-CM | POA: Diagnosis not present

## 2018-08-04 DIAGNOSIS — I10 Essential (primary) hypertension: Secondary | ICD-10-CM | POA: Diagnosis not present

## 2018-08-04 DIAGNOSIS — Z7984 Long term (current) use of oral hypoglycemic drugs: Secondary | ICD-10-CM | POA: Diagnosis not present

## 2018-08-04 DIAGNOSIS — E1165 Type 2 diabetes mellitus with hyperglycemia: Secondary | ICD-10-CM | POA: Diagnosis not present

## 2018-08-04 DIAGNOSIS — E7800 Pure hypercholesterolemia, unspecified: Secondary | ICD-10-CM | POA: Diagnosis not present

## 2018-08-04 DIAGNOSIS — E113393 Type 2 diabetes mellitus with moderate nonproliferative diabetic retinopathy without macular edema, bilateral: Secondary | ICD-10-CM | POA: Diagnosis not present

## 2018-08-04 DIAGNOSIS — E1121 Type 2 diabetes mellitus with diabetic nephropathy: Secondary | ICD-10-CM | POA: Diagnosis not present

## 2018-08-04 DIAGNOSIS — N182 Chronic kidney disease, stage 2 (mild): Secondary | ICD-10-CM | POA: Diagnosis not present

## 2018-08-04 DIAGNOSIS — Z0000 Encounter for general adult medical examination without abnormal findings: Secondary | ICD-10-CM | POA: Diagnosis not present

## 2018-08-04 LAB — COMPREHENSIVE METABOLIC PANEL
A/G RATIO: 1.5 (ref 1.2–2.2)
ALK PHOS: 74 IU/L (ref 39–117)
ALT: 16 IU/L (ref 0–32)
AST: 16 IU/L (ref 0–40)
Albumin: 4.3 g/dL (ref 3.5–5.5)
BUN/Creatinine Ratio: 23 (ref 9–23)
BUN: 12 mg/dL (ref 6–24)
CHLORIDE: 100 mmol/L (ref 96–106)
CO2: 24 mmol/L (ref 20–29)
Calcium: 9.5 mg/dL (ref 8.7–10.2)
Creatinine, Ser: 0.52 mg/dL — ABNORMAL LOW (ref 0.57–1.00)
GFR calc Af Amer: 128 mL/min/{1.73_m2} (ref >59)
GFR calc non Af Amer: 111 mL/min/{1.73_m2} (ref >59)
Globulin, Total: 2.9 g/dL (ref 1.5–4.5)
Glucose: 102 mg/dL — ABNORMAL HIGH (ref 65–99)
POTASSIUM: 4.8 mmol/L (ref 3.5–5.2)
Sodium: 138 mmol/L (ref 134–144)
Total Protein: 7.2 g/dL (ref 6.0–8.5)

## 2018-08-04 LAB — CBC WITH DIFFERENTIAL/PLATELET
BASOS ABS: 0 10*3/uL (ref 0.0–0.2)
Basos: 1 %
EOS (ABSOLUTE): 0.9 10*3/uL — AB (ref 0.0–0.4)
Eos: 13 %
Hematocrit: 34 % (ref 34.0–46.6)
Hemoglobin: 11 g/dL — ABNORMAL LOW (ref 11.1–15.9)
Immature Grans (Abs): 0 10*3/uL (ref 0.0–0.1)
Immature Granulocytes: 0 %
Lymphocytes Absolute: 2 10*3/uL (ref 0.7–3.1)
Lymphs: 27 %
MCH: 28.9 pg (ref 26.6–33.0)
MCHC: 32.4 g/dL (ref 31.5–35.7)
MCV: 89 fL (ref 79–97)
Monocytes Absolute: 0.6 10*3/uL (ref 0.1–0.9)
Monocytes: 8 %
NEUTROS ABS: 3.7 10*3/uL (ref 1.4–7.0)
NEUTROS PCT: 51 %
PLATELETS: 329 10*3/uL (ref 150–450)
RBC: 3.81 x10E6/uL (ref 3.77–5.28)
RDW: 14.7 % (ref 12.3–15.4)
WBC: 7.2 10*3/uL (ref 3.4–10.8)

## 2018-08-04 MED FILL — TRULICITY 1.5 MG/0.5 ML PEN: 1.5 | 28 days supply | Qty: 2 | Fill #8

## 2018-08-04 NOTE — Telephone Encounter (Signed)
Spoke to pt and relayed per MM/NP that lab results unremarkable. She stated understanding.

## 2018-08-04 NOTE — Telephone Encounter (Signed)
-----   Message from Butch Penny, NP sent at 08/04/2018  8:01 AM EDT ----- Lab work is unremarkable please call patient

## 2018-08-09 MED FILL — rOPINIRole HCL 1 MG TABS: 1 | 30 days supply | Qty: 90 | Fill #0

## 2018-08-15 MED FILL — BETASERON 0.3 MG KIT: 0.3 | 28 days supply | Qty: 14 | Fill #1

## 2018-08-29 MED FILL — TRULICITY 1.5 MG/0.5 ML PEN: 1.5 | 28 days supply | Qty: 2 | Fill #9

## 2018-08-29 MED FILL — TRAMADOL-ACETAMINOPHN 37.5-: 37.5-325 | 30 days supply | Qty: 60 | Fill #2

## 2018-09-06 MED FILL — BETASERON 0.3 MG KIT: 0.3 | 28 days supply | Qty: 14 | Fill #2

## 2018-09-06 MED FILL — rOPINIRole HCL 1 MG TABS: 1 | 30 days supply | Qty: 90 | Fill #1

## 2018-09-06 MED FILL — SERTRALINE HCL 50 MG TABLET: 50 | 90 days supply | Qty: 180 | Fill #2

## 2018-09-08 MED FILL — LISINOPRIL 10 MG TABLET: 10 | 90 days supply | Qty: 90 | Fill #2

## 2018-09-28 MED FILL — TRULICITY 1.5 MG/0.5 ML PEN: 1.5 | 28 days supply | Qty: 2 | Fill #10

## 2018-09-28 MED FILL — metFORMIN HCL 1000 MG TABS: 1000 | 90 days supply | Qty: 180 | Fill #0

## 2018-09-29 DIAGNOSIS — Z6830 Body mass index (BMI) 30.0-30.9, adult: Secondary | ICD-10-CM | POA: Diagnosis not present

## 2018-09-29 DIAGNOSIS — Z1231 Encounter for screening mammogram for malignant neoplasm of breast: Secondary | ICD-10-CM | POA: Diagnosis not present

## 2018-09-29 DIAGNOSIS — Z01419 Encounter for gynecological examination (general) (routine) without abnormal findings: Secondary | ICD-10-CM | POA: Diagnosis not present

## 2018-10-06 MED FILL — rOPINIRole HCL 1 MG TABS: 1 | 30 days supply | Qty: 90 | Fill #2

## 2018-10-06 MED FILL — BETASERON 0.3 MG KIT: 0.3 | 28 days supply | Qty: 14 | Fill #3

## 2018-10-06 MED FILL — ATORVASTATIN CALCIUM 20 MG: 20 | 90 days supply | Qty: 90 | Fill #3

## 2018-10-07 ENCOUNTER — Other Ambulatory Visit: Payer: Self-pay | Admitting: Neurology

## 2018-10-07 NOTE — Telephone Encounter (Signed)
Drug registry not available at ths time.

## 2018-10-10 MED FILL — TRAMADOL-ACETAMINOPHN 37.5-: 37.5-325 | 15 days supply | Qty: 30 | Fill #0

## 2018-10-10 NOTE — Telephone Encounter (Signed)
Take prn one tablet po, do not exceed 2 a day or 5 a week.

## 2018-10-11 ENCOUNTER — Telehealth: Payer: Self-pay | Admitting: Neurology

## 2018-10-11 NOTE — Telephone Encounter (Signed)
Received a refill request for the patient's Ultracet. Dr Dohmeier agreed refilling it and states that pt should  Take prn one tablet po, do not exceed 2 a day or 5 a week. Documented this information so would know how to refill moving forward.

## 2018-10-20 MED FILL — TRULICITY 1.5 MG/0.5 ML PEN: 1.5 | 28 days supply | Qty: 2 | Fill #11

## 2018-10-31 ENCOUNTER — Other Ambulatory Visit: Payer: Self-pay | Admitting: Neurology

## 2018-10-31 MED ORDER — TRAMADOL-ACETAMINOPHEN 37.5-325 MG PO TABS: 1.0000 | ORAL_TABLET | Freq: Two times a day (BID) | ORAL | 0 refills | Status: DC | PRN

## 2018-10-31 MED FILL — rOPINIRole HCL 1 MG TABS: 1 | 30 days supply | Qty: 90 | Fill #3

## 2018-10-31 MED FILL — FLUOCINONIDE 0.05% CREAM: 0.05 | 20 days supply | Qty: 60 | Fill #2

## 2018-10-31 MED FILL — TRAMADOL-ACETAMINOPHN 37.5-: 37.5-325 | 15 days supply | Qty: 30 | Fill #0

## 2018-10-31 MED FILL — BETASERON 0.3 MG KIT: 0.3 | 28 days supply | Qty: 14 | Fill #4

## 2018-11-27 ENCOUNTER — Other Ambulatory Visit: Payer: Self-pay | Admitting: Neurology

## 2018-11-28 MED ORDER — TRAMADOL-ACETAMINOPHEN 37.5-325 MG PO TABS: 1.0000 | ORAL_TABLET | Freq: Two times a day (BID) | ORAL | 0 refills | Status: DC | PRN

## 2018-11-28 MED FILL — TRULICITY 1.5 MG/0.5 ML PEN: 1.5 | 28 days supply | Qty: 2 | Fill #12

## 2018-11-28 MED FILL — TRAMADOL-ACETAMINOPHN 37.5-: 37.5-325 | 15 days supply | Qty: 30 | Fill #0

## 2018-11-28 MED FILL — BETASERON 0.3 MG KIT: 0.3 | 28 days supply | Qty: 14 | Fill #5

## 2018-11-28 MED FILL — rOPINIRole HCL 1 MG TABS: 1 | 30 days supply | Qty: 90 | Fill #4

## 2018-12-13 DIAGNOSIS — E1121 Type 2 diabetes mellitus with diabetic nephropathy: Secondary | ICD-10-CM | POA: Diagnosis not present

## 2018-12-13 DIAGNOSIS — Z1211 Encounter for screening for malignant neoplasm of colon: Secondary | ICD-10-CM | POA: Diagnosis not present

## 2018-12-13 DIAGNOSIS — N182 Chronic kidney disease, stage 2 (mild): Secondary | ICD-10-CM | POA: Diagnosis not present

## 2018-12-13 DIAGNOSIS — I10 Essential (primary) hypertension: Secondary | ICD-10-CM | POA: Diagnosis not present

## 2018-12-13 MED FILL — SERTRALINE HCL 50 MG TABLET: 50 | 90 days supply | Qty: 180 | Fill #3

## 2018-12-13 MED FILL — LISINOPRIL 10 MG TABS: 10 | 90 days supply | Qty: 90 | Fill #3

## 2018-12-21 MED FILL — TRULICITY 1.5 MG/0.5 ML PEN: 1.5 | 28 days supply | Qty: 2 | Fill #0

## 2018-12-22 ENCOUNTER — Other Ambulatory Visit: Payer: Self-pay | Admitting: Adult Health

## 2018-12-22 ENCOUNTER — Other Ambulatory Visit: Payer: Self-pay | Admitting: Pharmacist

## 2018-12-22 MED ORDER — BETASERON 0.3 MG ~~LOC~~ KIT: PACK | SUBCUTANEOUS | 5 refills | Status: DC

## 2018-12-23 ENCOUNTER — Telehealth: Payer: Self-pay

## 2018-12-23 NOTE — Telephone Encounter (Signed)
PA completed for Betaseron 0.3 MG # 14 for a 30 day supply via covermy meds. Pt's plan [employer]  Case ID [id number] Key [id number] Will follow on over my meds for determination.

## 2018-12-26 ENCOUNTER — Telehealth: Payer: Self-pay | Admitting: *Deleted

## 2018-12-26 MED FILL — BETASERON 0.3 MG KIT: 0.3 | 28 days supply | Qty: 14 | Fill #0

## 2018-12-26 NOTE — Telephone Encounter (Signed)
[place] out pt pharm notified of approval.

## 2018-12-26 NOTE — Telephone Encounter (Signed)
Pt [employer] form on [place] desk. 

## 2018-12-26 NOTE — Telephone Encounter (Signed)
Approval for betaseron 12/22/18 thru 12/22/19.  Approved for 14 kits per 28 days. [employer].  PA ref # [id number].

## 2018-12-27 DIAGNOSIS — Z0289 Encounter for other administrative examinations: Secondary | ICD-10-CM

## 2018-12-28 ENCOUNTER — Other Ambulatory Visit: Payer: Self-pay | Admitting: Neurology

## 2018-12-28 ENCOUNTER — Encounter: Payer: Self-pay | Admitting: Neurology

## 2018-12-28 MED ORDER — TRAMADOL-ACETAMINOPHEN 37.5-325 MG PO TABS: 1.0000 | ORAL_TABLET | Freq: Two times a day (BID) | ORAL | 0 refills | Status: DC | PRN

## 2018-12-28 MED FILL — TRAMADOL-ACETAMINOPHN 37.5-: 37.5-325 | 15 days supply | Qty: 30 | Fill #0

## 2018-12-28 NOTE — Telephone Encounter (Signed)
I completed the paperwork and I have turned it into Debra in medical records and stressed the urgency/ need for the papers to be sent in today. Stanton Kidney will fax for the patient.

## 2018-12-28 NOTE — Telephone Encounter (Signed)
Patient called and stated that this has been pending FMLA paperwork since Jan the 8th. [employer] told her they had sent our office faxes since the 8th of Jan. Our billing department called her Monday on the 20th to collect payment for the FMLA. She is stating that [employer] is now telling her if they don't get a response by tomorrow the 23rd  She will be denied for the year. The patient is frustrated and upset and would like to for this to be taken care of today. Please call and advise.

## 2018-12-30 MED FILL — metFORMIN HCL 1000 MG TABS: 1000 | 90 days supply | Qty: 180 | Fill #1

## 2018-12-30 MED FILL — rOPINIRole HCL 1 MG TABS: 1 | 30 days supply | Qty: 90 | Fill #5

## 2019-01-04 DIAGNOSIS — H3582 Retinal ischemia: Secondary | ICD-10-CM | POA: Diagnosis not present

## 2019-01-04 DIAGNOSIS — H40023 Open angle with borderline findings, high risk, bilateral: Secondary | ICD-10-CM | POA: Diagnosis not present

## 2019-01-04 DIAGNOSIS — H3562 Retinal hemorrhage, left eye: Secondary | ICD-10-CM | POA: Diagnosis not present

## 2019-01-04 DIAGNOSIS — H35033 Hypertensive retinopathy, bilateral: Secondary | ICD-10-CM | POA: Diagnosis not present

## 2019-01-04 DIAGNOSIS — E113293 Type 2 diabetes mellitus with mild nonproliferative diabetic retinopathy without macular edema, bilateral: Secondary | ICD-10-CM | POA: Diagnosis not present

## 2019-01-11 MED FILL — ATORVASTATIN CALCIUM 20 MG: 20 | 90 days supply | Qty: 90 | Fill #0 | Status: TO

## 2019-01-12 MED FILL — TRULICITY 1.5 MG/0.5 ML PEN: 1.5 | 28 days supply | Qty: 2 | Fill #1

## 2019-01-25 ENCOUNTER — Other Ambulatory Visit: Payer: Self-pay | Admitting: Adult Health

## 2019-01-25 DIAGNOSIS — G2581 Restless legs syndrome: Secondary | ICD-10-CM

## 2019-01-25 MED FILL — BETASERON 0.3 MG KIT: 0.3 | 28 days supply | Qty: 14 | Fill #1

## 2019-01-25 MED FILL — rOPINIRole HCL 1 MG TABS: 1 | 30 days supply | Qty: 90 | Fill #0

## 2019-02-06 ENCOUNTER — Other Ambulatory Visit: Payer: Self-pay | Admitting: Neurology

## 2019-02-06 ENCOUNTER — Other Ambulatory Visit: Payer: Self-pay

## 2019-02-06 ENCOUNTER — Ambulatory Visit: Payer: 59 | Admitting: Neurology

## 2019-02-06 ENCOUNTER — Encounter: Payer: Self-pay | Admitting: Neurology

## 2019-02-06 VITALS — BP 119/73 | HR 84 | Resp 16 | Ht 65.0 in | Wt 182.5 lb

## 2019-02-06 DIAGNOSIS — I159 Secondary hypertension, unspecified: Secondary | ICD-10-CM | POA: Diagnosis not present

## 2019-02-06 DIAGNOSIS — G35 Multiple sclerosis: Secondary | ICD-10-CM

## 2019-02-06 DIAGNOSIS — D500 Iron deficiency anemia secondary to blood loss (chronic): Secondary | ICD-10-CM

## 2019-02-06 DIAGNOSIS — G253 Myoclonus: Secondary | ICD-10-CM | POA: Diagnosis not present

## 2019-02-06 MED ORDER — TRAMADOL-ACETAMINOPHEN 37.5-325 MG PO TABS: 1.0000 | ORAL_TABLET | Freq: Two times a day (BID) | ORAL | 0 refills | Status: DC | PRN

## 2019-02-06 MED FILL — TRAMADOL-ACETAMINOPHN 37.5-: 37.5-325 | 15 days supply | Qty: 30 | Fill #0

## 2019-02-06 NOTE — Telephone Encounter (Signed)
rx refilled and sent to the pharamacy for the patient

## 2019-02-06 NOTE — Progress Notes (Signed)
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, she reports no changes in her overall neurologic health but may be decrease in her short-term memory dysfunction.  1 of her next visits will need to be associated with a [place] test. She has had no falls she can wear high heels, no problems with tandem gait heel to walk.  No changes in penmanship.  Reviewed medications there has been no change here she stays on ropinirole at bedtime, takes Betaseron every other day injection 0.3 mg. And  Iron supplements by mouth.   Patent has seen Erica Powell 3 times since my last visit with her, dated  06/28/2017: Erica Powell is returning today for a six-month revisit she had over the last 6 months taking an increased dose of Requip which has not helped her restless legs. She has some myoclonic jerking supple movements in both lower extremities, but mostly on the right. It starts as a painful spasm and then releases suddenly. It is very bothersome. The therapy works has not relieved the distress either. She has started dropping things more than usual, she may drop something right out of her hands. She has sometimes double vision, when she gets fatigue her left eye abducts to the center creating a horizontal diplopia. She also states that she has some cognitive problems forgets what she is doing, sometimes she has to talk to her legs to move, the jerks have gotten worse sometimes it banged up against the desk or buckle and she is walking. In the morning however she is stiff as well as when she stands for long periods at a time or sitting for longer time at the computer. She also has urinary problems. She sweats profusely when she mops, does homework housework physical work it is  not like a hot flash. She wonders if it could be a mass. She states that she feels " pretty much like crap all the time not just the normal tiredness".   Erica Powell is a 53 year old female with a history of multiple sclerosis and restless leg. She returns today for follow-up. Patient reports that she's had an increase in restless leg symptoms. Usually worse at bedtime.. She has increased her Requip taking it in the morning and at night. She states that this has offered her some benefit. In the past she has been diagnosed with iron deficiency and was given IV iron. She is not had any additional blood work. She continues on Betaseron. She is tolerating this well. Denies any new neurological symptoms. Overall she feels that she is doing well. She returns today for an evaluation.  HISTORY 12/31/15: Erica Powell is a 52 year old female with a history of multiple sclerosis and restless leg. She returns today for follow-up. The patient reports that she has been stable. She continues on Betaseron. She recently had lab work in November. She denies any trouble with her gait or balance. She states occasionally she will be stiff in the mornings but that resolves. She also states that occasionally she will have some urinary urgency but this is intermittent. Denies any trouble with her vision. However she does state that she has an appointment to have a torn retina repair. The patient is currently taking Requip for restless leg syndrome. She  states 0.25 mg at bedtime. She states occasionally she has to take the entire tablet in order to resolve her symptoms. Overall she is doing well. She denies any new neurological symptoms. She returns today for an evaluation.  HISTORY (Erica Powell): Erica Powell is a 53 y.o. left handed, caucasian female Is seen here as a routine once a year revisit for multiple sclerosis, originally referred from Dr. Laurann Powell.  Erica Powell has been a patient in our neurology clinic for the last 8 years,  beginning in 2006.  She was diagnosed with multiple sclerosis and treatment was initiated. She had no recent MS relapses but a brain MRI that had been performed in January 2014 was showing still scattered periventricular, subcortical and pontine T2 hyperintensities consistent with chronic demyelinating plaques of various age.  No acute lesions were seen in her last MRI obtained on 12/29/2012. She had 2 separate studies in 2006 at [facility]. Initally she had C5 and C 6 lesions, these dorsal cervical cord lesions did not span multiple levels. The patient was diagnosed in 2012 with diabetes and has a strong DM family history. Her last HbA1c just 2 month ago was 7.0 and had been in June 2014 at 8.3. She noticed an improvement in her urinary frequency once her sugars were lower. She also has still no clinical progression or relapse, but she reports problems with concentration, cognition and sometimes with multitasking. She feels that she is easily distracted and loses her train of thought when this happens. She feels especially distracted by noises at her work. 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 There's been no change in her employment, hours of work , hobbies or social life. She has a bachelor's degree ,is single but lives with her fianc. She recently noted a new symptom of tightness in her chest , a paraspinal tenderness and tightness, a confined feeling surrounding the chest , a possible variation of " the MS hug", she has responded well to massage, but the relief is not sustained.   She is a 7[employer] day [employer].     REVIEW OF SYSTEMS: Out of a complete 14 system review of symptoms, the patient complains only of the following symptoms, and all other reviewed systems are negative.  Trouble swallowing, myoclonic sudden jerking. Not classic RLS, fatigue.   ALLERGIES: Allergies  Allergen Reactions  . Cephalosporins Itching and Swelling    Occurs with  cephalexin (Keflex). Starts with prickling around the face, swelling/numb feeling of face.  . Hydrochlorothiazide Itching and Swelling    Swelling of the face, hives, itching, feeling of bad sunburn.   . Penicillins Itching and Swelling    Occurs with Augmentin and amoxicillin. Hives, prickling, swelling of face.   . Sulfa Antibiotics Itching and Swelling    Itching, hives, swelling of face  . Baclofen Other (See Comments)    Leg spasms, jittery, heart racing   . Clonazepam     Males me mean  . Lorazepam Other (See Comments)    "makes me real short-tempered, mean"    HOME MEDICATIONS: Outpatient Medications Prior to Visit  Medication Sig Dispense Refill  . aspirin 81 MG tablet Take 1 tablet (81 mg total) by mouth daily.    [name] atorvastatin (LIPITOR) 20 MG tablet Take 1 tablet by mouth daily.  3  . BETASERON 0.3 MG KIT injection Inject 0.3 mg into the skin every other day 14 kit 5  . cholecalciferol (VITAMIN D) 1000 UNITS tablet Take 1,000  Units by mouth daily.    . Dulaglutide (TRULICITY) 3.25 QD/8.2ME SOPN Inject 1.5 mg into the skin once a week.     . ferrous sulfate 325 (65 FE) MG tablet Take 325 mg by mouth daily with breakfast.    . hydrOXYzine (ATARAX/VISTARIL) 10 MG tablet TAKE 1 TABLET BY MOUTH 3 TIMES DAILY AS NEEDED. 90 tablet 1  . lisinopril (PRINIVIL,ZESTRIL) 10 MG tablet Take 1 tablet (10 mg total) by mouth daily.    . Melatonin 10 MG TABS Take 10 mg by mouth at bedtime.    . metFORMIN (GLUCOPHAGE) 1000 MG tablet Take 1 tablet (1,000 mg total) by mouth 2 (two) times daily with a meal.    . rOPINIRole (REQUIP) 1 MG tablet TAKE 1 TO 3 TABLETS BY MOUTH AT BEDTIME 90 tablet 5  . sertraline (ZOLOFT) 50 MG tablet Take 2 tablets (100 mg total) by mouth daily. 180 tablet 3  . traMADol-acetaminophen (ULTRACET) 37.5-325 MG tablet Take 1 tablet by mouth 2 (two) times daily as needed. 30 tablet 0  . vitamin B-12 (CYANOCOBALAMIN) 1000 MCG tablet Take 1,000 mcg by mouth daily.     No  facility-administered medications prior to visit.     PAST MEDICAL HISTORY: Past Medical History:  Diagnosis Date  . Anemia   . Diabetes mellitus   . Hyperlipemia   . Hypertension   . Multiple sclerosis ([place])   . RLS (restless legs syndrome)   . Type 2 diabetes mellitus without (mention of) complications     PAST SURGICAL HISTORY: Past Surgical History:  Procedure Laterality Date  . BONE CYST EXCISION    . ESSURE TUBAL LIGATION    . WISDOM TOOTH EXTRACTION  1985    FAMILY HISTORY: Family History  Problem Relation Age of Onset  . Stroke Mother   . Lupus Sister   . Cancer Maternal Uncle        bone cancer    SOCIAL HISTORY: Social History   Socioeconomic History  . Marital status: Single    Spouse name: Not on file  . Number of children: 0  . Years of education: Bachelor's  . Highest education level: Not on file  Occupational History  . Occupation: Psychiatrist: [employer]   Social Needs  . Financial resource strain: Not on file  . Food insecurity:    Worry: Not on file    Inability: Not on file  . Transportation needs:    Medical: Not on file    Non-medical: Not on file  Tobacco Use  . Smoking status: Never Smoker  . Smokeless tobacco: Never Used  Substance and Sexual Activity  . Alcohol use: No  . Drug use: No  . Sexual activity: Not on file  Lifestyle  . Physical activity:    Days per week: Not on file    Minutes per session: Not on file  . Stress: Not on file  Relationships  . Social connections:    Talks on phone: Not on file    Gets together: Not on file    Attends religious service: Not on file    Active member of club or organization: Not on file    Attends meetings of clubs or organizations: Not on file    Relationship status: Not on file  . Intimate partner violence:    Fear of current or ex partner: Not on file    Emotionally abused: Not on file    Physically abused: Not on file  Forced sexual activity: Not on  file  Other Topics Concern  . Not on file  Social History Narrative   Patient is single with no children   Patient is left handed   Patient has a Bachelor's degree   Patient drinks 20 oz daily      PHYSICAL EXAM  Vitals:   02/06/19 1017  BP: 119/73  Pulse: 84  Resp: 16  Weight: 182 lb 8 oz (82.8 kg)  Height: '5\' 5"'  (1.651 m)   Body mass index is 30.37 kg/m.  Generalized: Well developed, in no acute distress   Neurological examination  Mentation: Alert oriented to time, place, history taking. Follows all commands speech and language fluent Cranial nerve ,: no change in taste or smell. Pupils were equal round reactive to light. Extraocular movements were not full - she has left lazy eye, adducting , causing diplopia. Facial sensation and strength were normal. Uvula tongue midline. Head turning and shoulder shrug  were normal and symmetric. Motor: symmetric motor tone is noted throughout.  Sensory:intact to soft touch on all 4 extremities.  Coordination: Cerebellar testing reveals intact finger-nose-finger and heel-to-shin bilaterally.  Gait and station: Gait is normal. Turns with 3 steps.  Reflexes: Deep tendon reflexes are symmetric bilaterally.   DIAGNOSTIC DATA (LABS, IMAGING, TESTING) - I reviewed patient records, labs, notes, testing and imaging myself where available.  Lab Results  Component Value Date   WBC 7.2 08/03/2018   HGB 11.0 (L) 08/03/2018   HCT 34.0 08/03/2018   MCV 89 08/03/2018   PLT 329 08/03/2018      Component Value Date/Time   NA 138 08/03/2018 0844   K 4.8 08/03/2018 0844   CL 100 08/03/2018 0844   CO2 24 08/03/2018 0844   GLUCOSE 102 (H) 08/03/2018 0844   BUN 12 08/03/2018 0844   CREATININE 0.52 (L) 08/03/2018 0844   CALCIUM 9.5 08/03/2018 0844   PROT 7.2 08/03/2018 0844   ALBUMIN 4.3 08/03/2018 0844   AST 16 08/03/2018 0844   ALT 16 08/03/2018 0844   ALKPHOS 74 08/03/2018 0844   BILITOT <0.2 08/03/2018 0844   GFRNONAA 111 08/03/2018  0844   GFRAA 128 08/03/2018 0844      ASSESSMENT AND PLAN 53 y.o. year old caucasian female with a long standing MS history, RLS, and new memory complaints. She has myoclonic jerks.    1. Restless leg syndrome --- the patient reports myoclonic jerking, sudden uncontrollable movements sometimes in both legs at the same time but dominantly affecting the right leg. Will repeat MRI - last one was 2 years ago.   She may sit at her desk in both knees hit the top drawer.  3. History of iron deficiency - last iron test was normal, takes one a day OTC. 4. DM- may be contributing to her neuropathic numbness.    She will continue taking Requip 1 mg once a day.  I ordered Brain, cervical spine, thoracic spine MRI with CMET=  If no new lesions , may change or d/c MS medication.    Larey Seat, MD  02/06/2019, 10:53 AM [facility] [address] [place], [place] [address] [phone]

## 2019-02-06 NOTE — Telephone Encounter (Signed)
Copied from CRM [id number]. Topic: Quick Communication - See Telephone Encounter >> Feb 06, 2019 12:25 PM Gean Birchwood R wrote: Patient called in and would like to refill traMADol-acetaminophen 37.5-325 MG tablet  [facility] - [place], [place] - [address] [place].  [phone] (Phone) [phone] (Fax)

## 2019-02-07 ENCOUNTER — Telehealth: Payer: Self-pay | Admitting: Neurology

## 2019-02-07 LAB — COMPREHENSIVE METABOLIC PANEL
ALK PHOS: 68 IU/L (ref 39–117)
ALT: 15 IU/L (ref 0–32)
AST: 18 IU/L (ref 0–40)
Albumin/Globulin Ratio: 1.6 (ref 1.2–2.2)
Albumin: 4.2 g/dL (ref 3.8–4.9)
BUN/Creatinine Ratio: 13 (ref 9–23)
BUN: 8 mg/dL (ref 6–24)
Bilirubin Total: <0.2 mg/dL (ref 0.0–1.2)
CO2: 20 mmol/L (ref 20–29)
Calcium: 9.4 mg/dL (ref 8.7–10.2)
Chloride: 103 mmol/L (ref 96–106)
Creatinine, Ser: 0.6 mg/dL (ref 0.57–1.00)
GFR calc Af Amer: 121 mL/min/{1.73_m2} (ref >59)
GFR calc non Af Amer: 105 mL/min/{1.73_m2} (ref >59)
Globulin, Total: 2.7 g/dL (ref 1.5–4.5)
Glucose: 102 mg/dL — ABNORMAL HIGH (ref 65–99)
Potassium: 4.4 mmol/L (ref 3.5–5.2)
Sodium: 139 mmol/L (ref 134–144)
Total Protein: 6.9 g/dL (ref 6.0–8.5)

## 2019-02-07 NOTE — Telephone Encounter (Signed)
MR Brain w/wo contrast, MR Cervical spine w/wo contrast & MR Thoracic spine w/wo contrast Dr. Justice Deeds UMR Auth: NPR Ref # [id number]. Patient is scheduled for 03/01/19 at [facility].

## 2019-02-08 ENCOUNTER — Encounter: Payer: Self-pay | Admitting: Neurology

## 2019-02-08 MED FILL — TRULICITY 1.5 MG/0.5 ML PEN: 1.5 | 28 days supply | Qty: 2 | Fill #2 | Status: TO

## 2019-02-22 MED FILL — rOPINIRole HCL 1 MG TABS: 1 | 30 days supply | Qty: 90 | Fill #1 | Status: TO

## 2019-02-23 MED FILL — BETASERON 0.3 MG KIT: 0.3 | 28 days supply | Qty: 14 | Fill #2 | Status: TO

## 2019-02-24 ENCOUNTER — Encounter: Payer: Self-pay | Admitting: Neurology

## 2019-02-27 NOTE — Telephone Encounter (Signed)
Noted, thank you

## 2019-02-27 NOTE — Telephone Encounter (Signed)
This was just as a standard MRI for assess MS. Nothing urgent.

## 2019-02-27 NOTE — Telephone Encounter (Signed)
I left a voicemail informing the patient and due to our office protocol we are not having the MRI's for the time being. I stated that I will send the order to GI. I did inform her that they are booking about 4 weeks out and I left their number of [phone] and to give them a call if she has not heard from them in the next few days.

## 2019-02-28 ENCOUNTER — Other Ambulatory Visit: Payer: Self-pay | Admitting: Neurology

## 2019-02-28 DIAGNOSIS — Z5181 Encounter for therapeutic drug level monitoring: Secondary | ICD-10-CM

## 2019-02-28 DIAGNOSIS — G35 Multiple sclerosis: Secondary | ICD-10-CM

## 2019-02-28 MED FILL — LISINOPRIL 10 MG TABS: 10 | 90 days supply | Qty: 90 | Fill #0 | Status: TO

## 2019-02-28 MED FILL — SERTRALINE HCL 100 MG TAB: 100 | 90 days supply | Qty: 90 | Fill #0 | Status: TO

## 2019-02-28 NOTE — Telephone Encounter (Signed)
Which order for [facility] ? Baird Lyons?

## 2019-02-28 NOTE — Telephone Encounter (Signed)
Patient is scheduled at [facility] for Friday 03/31/19 and to arrive at 2:30 pm. I left a voicemail to inform this for the patient and I also left their number of [phone] incase she needed to reschedule for any reason.

## 2019-02-28 NOTE — Telephone Encounter (Signed)
Orders are placed for MRI's to be completed at [facility]

## 2019-02-28 NOTE — Telephone Encounter (Signed)
When you get a chance can you put new orders in for [facility]. Due to the patient was already schedule her they need new orders for them to have new accession numbers.

## 2019-03-01 ENCOUNTER — Other Ambulatory Visit: Payer: 59

## 2019-03-06 ENCOUNTER — Other Ambulatory Visit: Payer: Self-pay | Admitting: Neurology

## 2019-03-07 ENCOUNTER — Other Ambulatory Visit: Payer: Self-pay | Admitting: Neurology

## 2019-03-08 ENCOUNTER — Other Ambulatory Visit: Payer: Self-pay | Admitting: Neurology

## 2019-03-08 MED ORDER — TRAMADOL-ACETAMINOPHEN 37.5-325 MG PO TABS: 1.0000 | ORAL_TABLET | Freq: Two times a day (BID) | ORAL | 5 refills | Status: DC | PRN

## 2019-03-08 MED ORDER — TRAMADOL-ACETAMINOPHEN 37.5-325 MG PO TABS: 1.0000 | ORAL_TABLET | Freq: Two times a day (BID) | ORAL | 0 refills | Status: DC | PRN

## 2019-03-08 MED FILL — TRAMADOL-ACETAMINOPHN 37.5-: 37.5-325 | 15 days supply | Qty: 30 | Fill #0

## 2019-03-13 ENCOUNTER — Other Ambulatory Visit: Payer: Self-pay

## 2019-03-13 ENCOUNTER — Encounter: Payer: Self-pay | Admitting: Pharmacist

## 2019-03-13 ENCOUNTER — Ambulatory Visit (INDEPENDENT_AMBULATORY_CARE_PROVIDER_SITE_OTHER): Payer: 59 | Admitting: Pharmacist

## 2019-03-13 DIAGNOSIS — Z79899 Other long term (current) drug therapy: Secondary | ICD-10-CM

## 2019-03-13 MED FILL — TRULICITY 1.5 MG/0.5 ML PEN: 1.5 | 28 days supply | Qty: 2 | Fill #0

## 2019-03-13 NOTE — Progress Notes (Signed)
S: Patient is currently taking Betaseron for MS. Patient is managed by Dr. Vickey Huger for this. Recently saw neuro and is scheduled for MRI  Adherence: denies any missed doses.  Dosing:  0.3 mg every other day.  Drug-drug interactions:none  Monitoring: CBC: completed as of August 2019 LFTs: WNL - 02/06/2019 Thyroid function tests: WNL Flu-like symptoms: denies  Neuropsychiatric symptoms: denies Injection-site reactions: yes but manages appropriately MRI: scheduled for the end of the month   O:     Lab Results  Component Value Date   WBC 7.2 08/03/2018   HGB 11.0 (L) 08/03/2018   HCT 34.0 08/03/2018   MCV 89 08/03/2018   PLT 329 08/03/2018      Chemistry      Component Value Date/Time   NA 139 02/06/2019 1132   K 4.4 02/06/2019 1132   CL 103 02/06/2019 1132   CO2 20 02/06/2019 1132   BUN 8 02/06/2019 1132   CREATININE 0.60 02/06/2019 1132      Component Value Date/Time   CALCIUM 9.4 02/06/2019 1132   ALKPHOS 68 02/06/2019 1132   AST 18 02/06/2019 1132   ALT 15 02/06/2019 1132   BILITOT <0.2 02/06/2019 1132       A/P: 1. Medication review: Patient is tolerating Betaseron well with no reported adverse effects. MS has been stable on betaseron. Per neuro note, may look at changing or stopping meds depending on results of MRI, which is scheduled at the end of the month. No recommendations for any changes.   Alvino Blood, PharmD, BCPS, BCACP, CPP Clinical Pharmacist Practitioner  [phone]

## 2019-03-16 MED FILL — BETASERON 0.3 MG KIT: 0.3 | 28 days supply | Qty: 14 | Fill #0

## 2019-03-18 MED FILL — rOPINIRole HCL 1 MG TABS: 1 | 30 days supply | Qty: 90 | Fill #0

## 2019-03-21 ENCOUNTER — Other Ambulatory Visit: Payer: Self-pay

## 2019-03-21 ENCOUNTER — Ambulatory Visit (HOSPITAL_COMMUNITY)
Admission: RE | Admit: 2019-03-21 | Discharge: 2019-03-21 | Disposition: A | Discharge status: Home / Self Care | Payer: 59 | Source: Ambulatory Visit | Attending: Neurology | Admitting: Neurology

## 2019-03-21 DIAGNOSIS — I159 Secondary hypertension, unspecified: Secondary | ICD-10-CM | POA: Insufficient documentation

## 2019-03-21 DIAGNOSIS — Z5181 Encounter for therapeutic drug level monitoring: Secondary | ICD-10-CM | POA: Insufficient documentation

## 2019-03-21 DIAGNOSIS — G35 Multiple sclerosis: Secondary | ICD-10-CM | POA: Insufficient documentation

## 2019-03-21 DIAGNOSIS — D500 Iron deficiency anemia secondary to blood loss (chronic): Secondary | ICD-10-CM | POA: Diagnosis not present

## 2019-03-21 DIAGNOSIS — G253 Myoclonus: Secondary | ICD-10-CM | POA: Diagnosis not present

## 2019-03-21 MED ORDER — GADOBUTROL 1 MMOL/ML IV SOLN
9.0000 mL | Freq: Once | INTRAVENOUS | Status: AC | PRN
  Administered 2019-03-21: 10:00:00 9 mL via INTRAVENOUS

## 2019-03-28 ENCOUNTER — Encounter: Payer: Self-pay | Admitting: Neurology

## 2019-03-29 NOTE — Telephone Encounter (Signed)
Yes, this is dangerous- take benadryl 25 mg tid and start prednisone as a cream, OTC- if not better tomorrow, call me and you get a prednisone dose pack po or an Iv steroid. CD

## 2019-03-30 ENCOUNTER — Other Ambulatory Visit: Payer: Self-pay | Admitting: Neurology

## 2019-03-30 MED ORDER — METHYLPREDNISOLONE 4 MG PO TBPK: ORAL_TABLET | ORAL | 0 refills | Status: DC

## 2019-03-31 ENCOUNTER — Other Ambulatory Visit (HOSPITAL_COMMUNITY): Payer: Self-pay

## 2019-03-31 ENCOUNTER — Ambulatory Visit (HOSPITAL_COMMUNITY): Payer: 59

## 2019-04-06 MED FILL — TRAMADOL-ACETAMINOPHN 37.5-: 37.5-325 | 15 days supply | Qty: 30 | Fill #1 | Status: TO

## 2019-04-06 MED FILL — ATORVASTATIN 20 MG TABLET: 20 | 90 days supply | Qty: 90 | Fill #0 | Status: TO

## 2019-04-06 MED FILL — TRULICITY 1.5 MG/0.5 ML PEN: 1.5 | 28 days supply | Qty: 2 | Fill #1 | Status: TO

## 2019-04-06 MED FILL — metFORMIN HCL 1000 MG TABS: 1000 | 90 days supply | Qty: 180 | Fill #0 | Status: TO

## 2019-04-13 MED FILL — rOPINIRole HCL 1 MG TABS: 1 | 30 days supply | Qty: 90 | Fill #1 | Status: TO

## 2019-04-24 MED FILL — BETASERON 0.3 MG KIT: 0.3 | 28 days supply | Qty: 14 | Fill #1 | Status: TO

## 2019-04-26 DIAGNOSIS — Z7984 Long term (current) use of oral hypoglycemic drugs: Secondary | ICD-10-CM | POA: Diagnosis not present

## 2019-04-26 DIAGNOSIS — E1121 Type 2 diabetes mellitus with diabetic nephropathy: Secondary | ICD-10-CM | POA: Diagnosis not present

## 2019-04-26 DIAGNOSIS — N182 Chronic kidney disease, stage 2 (mild): Secondary | ICD-10-CM | POA: Diagnosis not present

## 2019-04-26 DIAGNOSIS — I10 Essential (primary) hypertension: Secondary | ICD-10-CM | POA: Diagnosis not present

## 2019-04-26 DIAGNOSIS — E113393 Type 2 diabetes mellitus with moderate nonproliferative diabetic retinopathy without macular edema, bilateral: Secondary | ICD-10-CM | POA: Diagnosis not present

## 2019-05-03 MED FILL — TRULICITY 1.5 MG/0.5 ML PEN: 1.5 | 28 days supply | Qty: 2 | Fill #0

## 2019-05-03 MED FILL — FLUOCINONIDE 0.05% CREAM: 0.05 | 20 days supply | Qty: 60 | Fill #0

## 2019-05-03 MED FILL — TRAMADOL-ACETAMINOPHN 37.5-: 37.5-325 | 15 days supply | Qty: 30 | Fill #0

## 2019-05-08 MED FILL — rOPINIRole HCL 1 MG TABS: 1 | 30 days supply | Qty: 90 | Fill #0

## 2019-05-11 MED FILL — LISINOPRIL 10 MG TABS: 10 | 90 days supply | Qty: 90 | Fill #0

## 2019-05-11 MED FILL — SERTRALINE HCL 100 MG TAB: 100 | 90 days supply | Qty: 90 | Fill #0

## 2019-05-15 MED FILL — BETASERON 0.3 MG KIT: 0.3 | 28 days supply | Qty: 14 | Fill #0

## 2019-05-16 MED FILL — TRAMADOL-ACETAMINOPHN 37.5-: 37.5-325 | 15 days supply | Qty: 30 | Fill #1

## 2019-06-09 MED FILL — rOPINIRole HCL 1 MG TABS: 1 | 30 days supply | Qty: 90 | Fill #1

## 2019-06-09 MED FILL — TRULICITY 1.5 MG/0.5 ML PEN: 1.5 | 28 days supply | Qty: 2 | Fill #1

## 2019-06-12 ENCOUNTER — Other Ambulatory Visit: Payer: Self-pay

## 2019-06-12 ENCOUNTER — Ambulatory Visit: Payer: 59 | Admitting: Neurology

## 2019-06-12 ENCOUNTER — Encounter: Payer: Self-pay | Admitting: Neurology

## 2019-06-12 VITALS — BP 122/84 | HR 109 | Temp 98.2°F | Ht 65.0 in | Wt 183.0 lb

## 2019-06-12 DIAGNOSIS — G2581 Restless legs syndrome: Secondary | ICD-10-CM

## 2019-06-12 DIAGNOSIS — G35 Multiple sclerosis: Secondary | ICD-10-CM

## 2019-06-12 MED ORDER — BETASERON 0.3 MG ~~LOC~~ KIT: PACK | SUBCUTANEOUS | 5 refills | Status: DC

## 2019-06-12 MED ORDER — ROPINIROLE HCL 1 MG PO TABS: ORAL_TABLET | ORAL | 5 refills | Status: DC

## 2019-06-12 MED FILL — BETASERON 0.3 MG KIT: 0.3 | 28 days supply | Qty: 14 | Fill #0

## 2019-06-12 NOTE — Progress Notes (Signed)
PATIENT: Erica Powell DOB: Dec 05, 1966  REASON FOR VISIT: follow up HISTORY FROM: patient here alone    Rv from 06-12-2019, Erica Powell had undergone MRI imaging and we are meeting today to discuss the findings. MRI with slowly progressive demyelination - compared to studies from 2006 on.  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  1 of her next visits will need to be associated with a Moca test. She has had no falls she can wear high heels, no problems with tandem gait heel to walk.  No changes in penmanship.  Reviewed medications there has been no change here she stays on ropinirole at bedtime, takes Betaseron every other day injection 0.3 mg. And  Iron supplements by mouth.   Patent has seen Erica Powell 3 times since my last visit with her, dated  06/28/2017: Erica Powell is returning today for a six-month revisit she had over the last 6 months taking an increased dose of Requip which has not helped her restless legs. She has some myoclonic jerking supple movements in both lower extremities, but mostly on the right. It starts as a painful spasm and then releases suddenly. It is very bothersome. The therapy works has not relieved the distress either. She has started dropping things more than usual, she may drop something right out of her hands. She has sometimes double vision, when she gets fatigue her left eye abducts to the center creating a horizontal diplopia. She also states that she has some cognitive problems forgets what she is doing, sometimes she has to talk to her legs to move, the jerks have gotten worse sometimes it  banged up against the desk or buckle and she is walking. In the morning however she is stiff as well as when she stands for long periods at a time or sitting for longer time at the computer. She also has urinary problems. She sweats profusely when she mops, does homework housework physical work it is not like a hot flash. She wonders if it could be a mass. She states that she feels " pretty much like crap all the time not just the normal tiredness".  HISTORY (Erica Powell): Erica Powell is a 53 y.o. left handed, caucasian female Is seen here as a routine once a year revisit for multiple sclerosis, originally referred from Erica Powell.  Erica Powell has been a patient in our neurology clinic for the last 8 years, beginning in 2006.  She was diagnosed with multiple sclerosis and treatment was initiated. She had no recent MS relapses but a brain MRI that had been performed in January 2014 was showing still scattered periventricular, subcortical and pontine T2 hyperintensities consistent with chronic demyelinating plaques of various age.  No acute lesions were seen in her last MRI obtained on 12/29/2012. She had 2 separate studies in 2006 at [facility]. Initally she had C5 and C 6 lesions, these dorsal cervical cord lesions did not span multiple levels. The patient was diagnosed in 2012 with diabetes and has a strong DM family history. Her last HbA1c just 2 month ago was 7.0 and had been in June 2014  at 8.3. She noticed an improvement in her urinary frequency once her sugars were lower. She also has still no clinical progression or relapse, but she reports problems with concentration, cognition and sometimes with multitasking. She feels that she is easily distracted and loses her train of thought when this happens. She feels especially distracted by noises at her work. 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 There's been no change in her employment, hours of work ,  hobbies or social life. She has a bachelor's degree ,is single but lives with her fianc. She recently noted a new symptom of tightness in her chest , a paraspinal tenderness and tightness, a confined feeling surrounding the chest , a possible variation of " the MS hug", she has responded well to massage, but the relief is not sustained.   She is a [employer] day [employer].     REVIEW OF SYSTEMS: Out of a complete 14 system review of symptoms, the patient complains only of the following symptoms, and all other reviewed systems are negative.  Trouble swallowing, myoclonic sudden jerking. Not classic RLS, fatigue.   Reports myoclonic jerks.  ALLERGIES: Allergies  Allergen Reactions   Cephalosporins Itching and Swelling    Occurs with cephalexin (Keflex). Starts with prickling around the face, swelling/numb feeling of face.   Hydrochlorothiazide Itching and Swelling    Swelling of the face, hives, itching, feeling of bad sunburn.    Penicillins Itching and Swelling    Occurs with Augmentin and amoxicillin. Hives, prickling, swelling of face.    Sulfa Antibiotics Itching and Swelling    Itching, hives, swelling of face   Baclofen Other (See Comments)    Leg spasms, jittery, heart racing    Clonazepam     Males me mean   Lorazepam Other (See Comments)    "makes me real short-tempered, mean"    HOME MEDICATIONS: Outpatient Medications Prior to Visit  Medication Sig Dispense Refill   aspirin 81 MG tablet Take 1 tablet (81 mg total) by mouth daily.     atorvastatin (LIPITOR) 20 MG tablet Take 1 tablet by mouth daily.  3   BETASERON 0.3 MG KIT injection Inject 0.3 mg into the skin every other day 14 kit 5   cholecalciferol (VITAMIN D) 1000 UNITS tablet Take 1,000 Units by mouth daily.     Dulaglutide (TRULICITY) 2.22 LN/9.8XQ SOPN Inject 1.5 mg into the skin once a week.      ferrous sulfate 325 (65 FE) MG tablet Take 325 mg by mouth daily with breakfast.     hydrOXYzine  (ATARAX/VISTARIL) 10 MG tablet TAKE 1 TABLET BY MOUTH 3 TIMES DAILY AS NEEDED. 90 tablet 1   lisinopril (PRINIVIL,ZESTRIL) 10 MG tablet Take 1 tablet (10 mg total) by mouth daily.     metFORMIN (GLUCOPHAGE) 1000 MG tablet Take 1 tablet (1,000 mg total) by mouth 2 (two) times daily with a meal.     rOPINIRole (REQUIP) 1 MG tablet TAKE 1 TO 3 TABLETS BY MOUTH AT BEDTIME 90 tablet 5   sertraline (ZOLOFT) 100 MG tablet      sertraline (ZOLOFT) 50 MG tablet Take 2 tablets (100 mg total) by mouth daily. 180 tablet 3   traMADol-acetaminophen (ULTRACET) 37.5-325 MG tablet Take 1 tablet by mouth 2 (two) times daily as needed. 30 tablet 5   vitamin B-12 (CYANOCOBALAMIN) 1000 MCG tablet Take 1,000 mcg by mouth daily.     Melatonin 10 MG TABS Take 10 mg by  mouth at bedtime.     methylPREDNISolone (MEDROL DOSEPAK) 4 MG TBPK tablet Follow dose pack instructions, start with 6 pills day one and decrease by 1 pill each day until complete. 21 tablet 0   traMADol-acetaminophen (ULTRACET) 37.5-325 MG tablet Take 1 tablet by mouth 2 (two) times daily as needed. 30 tablet 0   No facility-administered medications prior to visit.     PAST MEDICAL HISTORY: Past Medical History:  Diagnosis Date   Anemia    Diabetes mellitus    Hyperlipemia    Hypertension    Multiple sclerosis (HCC)    RLS (restless legs syndrome)    Type 2 diabetes mellitus without (mention of) complications     PAST SURGICAL HISTORY: Past Surgical History:  Procedure Laterality Date   BONE CYST EXCISION     ESSURE TUBAL LIGATION     WISDOM TOOTH EXTRACTION  1985    FAMILY HISTORY: Family History  Problem Relation Age of Onset   Stroke Mother    Lupus Sister    Cancer Maternal Uncle        bone cancer    SOCIAL HISTORY: Social History   Socioeconomic History   Marital status: Single    Spouse name: Not on file   Number of children: 0   Years of education: Bachelor's   Highest education level: Not  on file  Occupational History   Occupation: Psychiatrist: Engineer, maintenance strain: Not on file   Food insecurity    Worry: Not on file    Inability: Not on file   Transportation needs    Medical: Not on file    Non-medical: Not on file  Tobacco Use   Smoking status: Never Smoker   Smokeless tobacco: Never Used  Substance and Sexual Activity   Alcohol use: No   Drug use: No   Sexual activity: Not on file  Lifestyle   Physical activity    Days per week: Not on file    Minutes per session: Not on file   Stress: Not on file  Relationships   Social connections    Talks on phone: Not on file    Gets together: Not on file    Attends religious service: Not on file    Active member of club or organization: Not on file    Attends meetings of clubs or organizations: Not on file    Relationship status: Not on file   Intimate partner violence    Fear of current or ex partner: Not on file    Emotionally abused: Not on file    Physically abused: Not on file    Forced sexual activity: Not on file  Other Topics Concern   Not on file  Social History Narrative   Patient is single with no children   Patient is left handed   Patient has a Water quality scientist degree   Patient drinks 20 oz daily      PHYSICAL EXAM  Vitals:   06/12/19 1316  BP: 122/84  Pulse: (!) 109  Temp: 98.2 F (36.8 C)  Weight: 183 lb (83 kg)  Height: _0  (1.651 m)   Body mass index is 30.45 kg/m.  Generalized: Well developed, in no acute distress   Neurological examination  Mentation: Alert oriented to time, place, history taking. Follows all commands speech and language fluent Cranial nerve ,: no change in taste or smell.  Pupils were equal round reactive  to light.  Extraocular movements were not full - she has left lazy eye, adducting , causing diplopia. Facial sensation and strength were normal. Uvula tongue midline. Head turning and shoulder  shrug  were normal and symmetric. Motor: symmetric motor tone, normal ROm, no rigor.  Sensory:intact to soft touch on all 4 extremities.ghout.   Coordination:  Gait and station: Gait is balanced , she .turns with 3 steps.  Reflexes: Deep tendon reflexes are symmetric bilaterally.[name]  DIAGNOSTIC DATA (LABS, IMAGING, TESTING) - I reviewed patient records, labs, notes, testing and imaging myself where available.  Lab Results  Component Value Date   WBC 7.2 08/03/2018   HGB 11.0 (L) 08/03/2018   HCT 34.0 08/03/2018   MCV 89 08/03/2018   PLT 329 08/03/2018      Component Value Date/Time   NA 139 02/06/2019 1132   K 4.4 02/06/2019 1132   CL 103 02/06/2019 1132   CO2 20 02/06/2019 1132   GLUCOSE 102 (H) 02/06/2019 1132   BUN 8 02/06/2019 1132   CREATININE 0.60 02/06/2019 1132   CALCIUM 9.4 02/06/2019 1132   PROT 6.9 02/06/2019 1132   ALBUMIN 4.2 02/06/2019 1132   AST 18 02/06/2019 1132   ALT 15 02/06/2019 1132   ALKPHOS 68 02/06/2019 1132   BILITOT <0.2 02/06/2019 1132   GFRNONAA 105 02/06/2019 1132   GFRAA 121 02/06/2019 1132      ASSESSMENT AND PLAN 53 y.o. year old caucasian female with a long standing MS history, RLS, and new memory complaints. She has myoclonic jerks.    1. Restless leg syndrome -- the patient reports myoclonic jerking, sudden uncontrollable movements sometimes in both legs at the same time but dominantly affecting the right leg.. its unrelated to the MS !  I think its stress.  2. History of iron deficiency - last iron test was normal, takes one a day OTC. 3. DM- may be contributing to her neuropathic numbness.    She will continue taking Requip 1 mg once a day.   Since  no new lesions , no reason to  change or d/c MS medication.     Betaseron , she is not interested in tysabri or MAB therapy.   Larey Seat, MD  06/12/2019, 1:43 PM [facility] [address] [place], [place] [address] [phone]

## 2019-06-12 NOTE — Patient Instructions (Signed)
Natalizumab injection What is this medicine? NATALIZUMAB (na ta LIZ you mab) is used to treat relapsing multiple sclerosis. This drug is not a cure. It is also used to treat Crohn's disease. This medicine may be used for other purposes; ask your health care provider or pharmacist if you have questions. COMMON BRAND NAME(S): Tysabri What should I tell my health care provider before I take this medicine? They need to know if you have any of these conditions:  immune system problems  progressive multifocal leukoencephalopathy (PML)  an unusual or allergic reaction to natalizumab, other medicines, foods, dyes, or preservatives  pregnant or trying to get pregnant  breast-feeding How should I use this medicine? This medicine is for infusion into a vein. It is given by a health care professional in a hospital or clinic setting. A special MedGuide will be given to you by the pharmacist with each prescription and refill. Be sure to read this information carefully each time. Talk to your pediatrician regarding the use of this medicine in children. This medicine is not approved for use in children. Overdosage: If you think you have taken too much of this medicine contact a poison control center or emergency room at once. NOTE: This medicine is only for you. Do not share this medicine with others. What if I miss a dose? It is important not to miss your dose. Call your doctor or health care professional if you are unable to keep an appointment. What may interact with this medicine?  azathioprine  cyclosporine  interferon  6-mercaptopurine  methotrexate  steroid medicines like prednisone or cortisone  TNF-alpha inhibitors like adalimumab, etanercept, and infliximab  vaccines This list may not describe all possible interactions. Give your health care provider a list of all the medicines, herbs, non-prescription drugs, or dietary supplements you use. Also tell them if you smoke, drink  alcohol, or use illegal drugs. Some items may interact with your medicine. What should I watch for while using this medicine? Your condition will be monitored carefully while you are receiving this medicine. Visit your doctor for regular check ups. Tell your doctor or healthcare professional if your symptoms do not start to get better or if they get worse. Stay away from people who are sick. Call your doctor or health care professional for advice if you get a fever, chills or sore throat, or other symptoms of a cold or flu. Do not treat yourself. In some patients, this medicine may cause a serious brain infection that may cause death. If you have any problems seeing, thinking, speaking, walking, or standing, tell your doctor right away. If you cannot reach your doctor, get urgent medical care. What side effects may I notice from receiving this medicine? Side effects that you should report to your doctor or health care professional as soon as possible:  allergic reactions like skin rash, itching or hives, swelling of the face, lips, or tongue  breathing problems  changes in vision  chest pain  dark urine  depression, feelings of sadness  dizziness  general ill feeling or flu-like symptoms  irregular, missed, or painful menstrual periods  light-colored stools  loss of appetite, nausea  muscle weakness  problems with balance, talking, or walking  right upper belly pain  unusually weak or tired  yellowing of the eyes or skin Side effects that usually do not require medical attention (report to your doctor or health care professional if they continue or are bothersome):  aches, pains  headache  stomach upset    tiredness This list may not describe all possible side effects. Call your doctor for medical advice about side effects. You may report side effects to FDA at 1-800-FDA-1088. Where should I keep my medicine? This drug is given in a hospital or clinic and will not be  stored at home. NOTE: This sheet is a summary. It may not cover all possible information. If you have questions about this medicine, talk to your doctor, pharmacist, or health care provider.  2020 Elsevier/Gold Standard (2009-01-12 13:33:21)  

## 2019-06-19 ENCOUNTER — Encounter: Payer: Self-pay | Admitting: Neurology

## 2019-06-19 MED FILL — TRAMADOL-ACETAMINOPHN 37.5-: 37.5-325 | 15 days supply | Qty: 30 | Fill #2

## 2019-06-30 MED FILL — metFORMIN HCL 1000 MG TABS: 1000 | 90 days supply | Qty: 180 | Fill #0

## 2019-07-03 MED FILL — TRULICITY 1.5 MG/0.5 ML PEN: 1.5 | 28 days supply | Qty: 2 | Fill #2

## 2019-07-03 MED FILL — rOPINIRole HCL 1 MG TABS: 1 | 30 days supply | Qty: 90 | Fill #0

## 2019-07-05 DIAGNOSIS — E113293 Type 2 diabetes mellitus with mild nonproliferative diabetic retinopathy without macular edema, bilateral: Secondary | ICD-10-CM | POA: Diagnosis not present

## 2019-07-05 DIAGNOSIS — H35033 Hypertensive retinopathy, bilateral: Secondary | ICD-10-CM | POA: Diagnosis not present

## 2019-07-05 DIAGNOSIS — H3582 Retinal ischemia: Secondary | ICD-10-CM | POA: Diagnosis not present

## 2019-07-05 DIAGNOSIS — H3562 Retinal hemorrhage, left eye: Secondary | ICD-10-CM | POA: Diagnosis not present

## 2019-07-10 MED FILL — BETASERON 0.3 MG KIT: 0.3 | 28 days supply | Qty: 14 | Fill #1

## 2019-07-10 MED FILL — TRAMADOL-ACETAMINOPHN 37.5-: 37.5-325 | 15 days supply | Qty: 30 | Fill #3

## 2019-07-17 ENCOUNTER — Other Ambulatory Visit: Payer: Self-pay | Admitting: Pharmacist

## 2019-07-17 MED ORDER — BETASERON 0.3 MG ~~LOC~~ KIT: PACK | SUBCUTANEOUS | 4 refills | Status: DC

## 2019-07-26 ENCOUNTER — Other Ambulatory Visit: Payer: Self-pay | Admitting: Neurology

## 2019-07-26 ENCOUNTER — Encounter: Payer: Self-pay | Admitting: Neurology

## 2019-07-26 MED ORDER — HYDROXYZINE HCL 10 MG PO TABS: 10.0000 mg | ORAL_TABLET | Freq: Three times a day (TID) | ORAL | 1 refills | Status: DC | PRN

## 2019-07-26 MED ORDER — TRAMADOL-ACETAMINOPHEN 37.5-325 MG PO TABS: 1.0000 | ORAL_TABLET | Freq: Two times a day (BID) | ORAL | 5 refills | Status: DC | PRN

## 2019-07-26 MED FILL — TRULICITY 1.5 MG/0.5 ML PEN: 1.5 | 28 days supply | Qty: 2 | Fill #3

## 2019-07-26 MED FILL — hydrOXYzine HCL 10 MG TABS: 10 | 30 days supply | Qty: 90 | Fill #0

## 2019-07-26 MED FILL — TRAMADOL-ACETAMINOPHN 37.5-: 37.5-325 | 30 days supply | Qty: 60 | Fill #0

## 2019-07-27 MED FILL — ATORVASTATIN 20 MG TABLET: 20 | 90 days supply | Qty: 90 | Fill #0

## 2019-08-07 MED FILL — BETASERON 0.3 MG KIT: 0.3 | 28 days supply | Qty: 14 | Fill #0

## 2019-08-07 MED FILL — rOPINIRole HCL 1 MG TABS: 1 | 30 days supply | Qty: 90 | Fill #1

## 2019-08-15 MED FILL — FLUOCINONIDE 0.05% CREAM: 0.05 | 20 days supply | Qty: 60 | Fill #1

## 2019-08-15 MED FILL — LISINOPRIL 10 MG TABS: 10 | 90 days supply | Qty: 90 | Fill #1

## 2019-08-15 MED FILL — SERTRALINE HCL 100 MG TAB: 100 | 90 days supply | Qty: 90 | Fill #1

## 2019-08-16 DIAGNOSIS — R21 Rash and other nonspecific skin eruption: Secondary | ICD-10-CM | POA: Diagnosis not present

## 2019-08-16 DIAGNOSIS — I10 Essential (primary) hypertension: Secondary | ICD-10-CM | POA: Diagnosis not present

## 2019-08-16 DIAGNOSIS — E611 Iron deficiency: Secondary | ICD-10-CM | POA: Diagnosis not present

## 2019-08-16 DIAGNOSIS — M79605 Pain in left leg: Secondary | ICD-10-CM | POA: Diagnosis not present

## 2019-08-16 DIAGNOSIS — E7800 Pure hypercholesterolemia, unspecified: Secondary | ICD-10-CM | POA: Diagnosis not present

## 2019-08-16 DIAGNOSIS — E113393 Type 2 diabetes mellitus with moderate nonproliferative diabetic retinopathy without macular edema, bilateral: Secondary | ICD-10-CM | POA: Diagnosis not present

## 2019-08-16 DIAGNOSIS — G35 Multiple sclerosis: Secondary | ICD-10-CM | POA: Diagnosis not present

## 2019-08-16 DIAGNOSIS — Z0000 Encounter for general adult medical examination without abnormal findings: Secondary | ICD-10-CM | POA: Diagnosis not present

## 2019-08-16 DIAGNOSIS — M79604 Pain in right leg: Secondary | ICD-10-CM | POA: Diagnosis not present

## 2019-08-16 DIAGNOSIS — N182 Chronic kidney disease, stage 2 (mild): Secondary | ICD-10-CM | POA: Diagnosis not present

## 2019-08-16 DIAGNOSIS — E1121 Type 2 diabetes mellitus with diabetic nephropathy: Secondary | ICD-10-CM | POA: Diagnosis not present

## 2019-08-23 MED FILL — TRULICITY 1.5 MG/0.5 ML PEN: 1.5 | 28 days supply | Qty: 2 | Fill #4

## 2019-09-06 MED FILL — rOPINIRole HCL 1 MG TABS: 1 | 30 days supply | Qty: 90 | Fill #2

## 2019-09-06 MED FILL — TRAMADOL-ACETAMINOPHN 37.5-: 37.5-325 | 30 days supply | Qty: 60 | Fill #1

## 2019-09-15 MED FILL — BETASERON 0.3 MG KIT: 0.3 | 28 days supply | Qty: 14 | Fill #1

## 2019-09-19 MED FILL — TRULICITY 1.5 MG/0.5 ML PEN: 1.5 | 28 days supply | Qty: 2 | Fill #5

## 2019-10-03 MED FILL — rOPINIRole HCL 1 MG TABS: 1 | 30 days supply | Qty: 90 | Fill #3

## 2019-10-03 MED FILL — metFORMIN HCL 1000 MG TABS: 1000 | 90 days supply | Qty: 180 | Fill #0

## 2019-10-03 MED FILL — TRAMADOL-ACETAMINOPHN 37.5-: 37.5-325 | 30 days supply | Qty: 60 | Fill #2

## 2019-10-17 MED FILL — TRULICITY 1.5 MG/0.5 ML PEN: 1.5 | 28 days supply | Qty: 2 | Fill #6

## 2019-10-17 MED FILL — BETASERON 0.3 MG KIT: 0.3 | 28 days supply | Qty: 14 | Fill #2

## 2019-10-30 MED FILL — rOPINIRole HCL 1 MG TABS: 1 | 30 days supply | Qty: 90 | Fill #4

## 2019-10-30 MED FILL — TRAMADOL-ACETAMINOPHN 37.5-: 37.5-325 | 30 days supply | Qty: 60 | Fill #3

## 2019-10-30 MED FILL — hydrOXYzine HCL 10 MG TABS: 10 | 30 days supply | Qty: 90 | Fill #1

## 2019-10-31 MED FILL — ATORVASTATIN 20 MG TABLET: 20 | 90 days supply | Qty: 90 | Fill #0

## 2019-11-08 MED FILL — BETASERON 0.3 MG KIT: 0.3 | 28 days supply | Qty: 14 | Fill #0

## 2019-11-21 ENCOUNTER — Encounter: Payer: Self-pay | Admitting: Neurology

## 2019-11-27 MED FILL — TRULICITY 1.5 MG/0.5 ML PEN: 1.5 | 28 days supply | Qty: 2 | Fill #0

## 2019-11-27 MED FILL — rOPINIRole HCL 1 MG TABS: 1 | 30 days supply | Qty: 90 | Fill #5

## 2019-11-27 MED FILL — SERTRALINE HCL 100 MG TAB: 100 | 90 days supply | Qty: 90 | Fill #2

## 2019-11-27 MED FILL — LISINOPRIL 10 MG TABS: 10 | 90 days supply | Qty: 90 | Fill #2

## 2019-12-05 DIAGNOSIS — Z01419 Encounter for gynecological examination (general) (routine) without abnormal findings: Secondary | ICD-10-CM | POA: Diagnosis not present

## 2019-12-05 DIAGNOSIS — Z6831 Body mass index (BMI) 31.0-31.9, adult: Secondary | ICD-10-CM | POA: Diagnosis not present

## 2019-12-05 DIAGNOSIS — N911 Secondary amenorrhea: Secondary | ICD-10-CM | POA: Diagnosis not present

## 2019-12-05 DIAGNOSIS — Z1231 Encounter for screening mammogram for malignant neoplasm of breast: Secondary | ICD-10-CM | POA: Diagnosis not present

## 2019-12-05 MED FILL — BETAMETHASONE VALERATE 0.1: 0.1 | 14 days supply | Qty: 15 | Fill #0

## 2019-12-11 MED FILL — TRAMADOL-ACETAMINOPHN 37.5-: 37.5-325 | 30 days supply | Qty: 60 | Fill #4

## 2019-12-12 ENCOUNTER — Other Ambulatory Visit: Payer: Self-pay | Admitting: Neurology

## 2019-12-12 ENCOUNTER — Other Ambulatory Visit: Payer: Self-pay | Admitting: Pharmacist

## 2019-12-12 MED ORDER — BETASERON 0.3 MG ~~LOC~~ KIT: PACK | SUBCUTANEOUS | 5 refills | Status: DC

## 2019-12-13 MED FILL — BETASERON 0.3 MG KIT: 0.3 | 28 days supply | Qty: 14 | Fill #0

## 2019-12-19 ENCOUNTER — Ambulatory Visit (INDEPENDENT_AMBULATORY_CARE_PROVIDER_SITE_OTHER): Payer: 59 | Admitting: Neurology

## 2019-12-19 ENCOUNTER — Encounter: Payer: Self-pay | Admitting: Neurology

## 2019-12-19 ENCOUNTER — Other Ambulatory Visit: Payer: Self-pay

## 2019-12-19 DIAGNOSIS — G35 Multiple sclerosis: Secondary | ICD-10-CM

## 2019-12-19 DIAGNOSIS — G2581 Restless legs syndrome: Secondary | ICD-10-CM

## 2019-12-19 DIAGNOSIS — E0840 Diabetes mellitus due to underlying condition with diabetic neuropathy, unspecified: Secondary | ICD-10-CM

## 2019-12-19 DIAGNOSIS — F39 Unspecified mood [affective] disorder: Secondary | ICD-10-CM | POA: Diagnosis not present

## 2019-12-19 MED ORDER — SODIUM CHLORIDE 0.9 % IV SOLN: 150.0000 mg | Freq: Once | INTRAVENOUS | 2 refills | Status: AC

## 2019-12-19 MED ORDER — ROPINIROLE HCL 1 MG PO TABS: ORAL_TABLET | ORAL | 5 refills | Status: DC

## 2019-12-19 NOTE — Progress Notes (Signed)
[name]    PATIENT: Erica Powell DOB: Jan 14, 1966  REASON FOR VISIT: follow up HISTORY FROM: patient here alone  CC: pt states that her RLS has been acting up. last time ferritin level was completed 08/16/19 and was 14.6.  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  She takes care of her mother in [place] ( who had a stroke)  and reports recently an increase in RLS and drop in ferritin( 9.9.2020 14.6) . She had a JCV panel in July 2020- negative.  We are discussing starting po iron versus iv- she has taken po iron and has not had a benefit. She reports pounding her legs in pain.     Rv from 06-12-2019, Chantal Hipps had undergone MRI imaging and we are meeting today to discuss the findings. MRI with slowly progressive demyelination - compared to studies from 2006 on.  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   02-06-2019, RV with Chantal Corradi, a hospital pharmacy technician is seen in a RV.  Ms. 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  1 of her next visits will need to be associated with a [place] test. She has had no falls she can wear high heels, no problems with tandem gait heel to walk.  No changes in penmanship.  Reviewed medications there has been no change here she stays on ropinirole at bedtime, takes Betaseron every other day injection 0.3 mg. And  Iron supplements by mouth.   Patent has seen [name] 3 times since my last visit with her, dated  06/28/2017: Florence is returning today for a six-month revisit she had over the last 6 months taking an increased dose of Requip which has not helped her restless legs. She has some myoclonic jerking supple movements in both lower extremities, but mostly on the right. It starts as a  painful spasm and then releases suddenly. It is very bothersome. The therapy works has not relieved the distress either. She has started dropping things more than usual, she may drop something right out of her hands. She has sometimes double vision, when she gets fatigue her left eye abducts to the center creating a horizontal diplopia. She also states that she has some cognitive problems forgets what she is doing, sometimes she has to talk to her legs to move, the jerks have gotten worse sometimes it banged up against the desk or buckle and she is walking. In the morning however she is stiff as well as when she stands for long periods at a time or sitting for longer time at the computer. She also has urinary problems. She sweats profusely when she mops, does homework housework physical work it is not like a hot flash. She wonders if it could be a mass. She states that she feels " pretty much like crap all the time not just the normal tiredness".  HISTORY (Emme Rosenau): Erica Powell is a 54 y.o. left handed, caucasian female Is seen here as a routine once a year revisit for multiple sclerosis, originally referred from Dr. Laurann Montana.  Miss Difabio has been a patient in our neurology clinic for the last 8 years, beginning in 2006.  She was diagnosed with multiple sclerosis and treatment was initiated. She had no  recent MS relapses but a brain MRI that had been performed in January 2014 was showing still scattered periventricular, subcortical and pontine T2 hyperintensities consistent with chronic demyelinating plaques of various age.  No acute lesions were seen in her last MRI obtained on 12/29/2012. She had 2 separate studies in 2006 at [facility]. Initally she had C5 and C 6 lesions, these dorsal cervical cord lesions did not span multiple levels. The patient was diagnosed in 2012 with diabetes and has a strong DM family history. Her last HbA1c just 2 month ago was 7.0 and had been in June 2014 at 8.3. She  noticed an improvement in her urinary frequency once her sugars were lower. She also has still no clinical progression or relapse, but she reports problems with concentration, cognition and sometimes with multitasking. She feels that she is easily distracted and loses her train of thought when this happens. She feels especially distracted by noises at her work. 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 There's been no change in her employment, hours of work , hobbies or social life. She has a bachelor's degree ,is single but lives with her fianc. She recently noted a new symptom of tightness in her chest , a paraspinal tenderness and tightness, a confined feeling surrounding the chest , a possible variation of " the MS hug", she has responded well to massage, but the relief is not sustained.   She is a [employer] day [employer].     REVIEW OF SYSTEMS: Out of a complete 14 system review of symptoms, the patient complains only of the following symptoms, and all other reviewed systems are negative.  Trouble swallowing, myoclonic sudden jerking. Not classic RLS, fatigue.   Reports myoclonic jerks.  ALLERGIES: Allergies  Allergen Reactions  . Cephalosporins Itching and Swelling    Occurs with cephalexin (Keflex). Starts with prickling around the face, swelling/numb feeling of face.  . Hydrochlorothiazide Itching and Swelling    Swelling of the face, hives, itching, feeling of bad sunburn.   . Penicillins Itching and Swelling    Occurs with Augmentin and amoxicillin. Hives, prickling, swelling of face.   . Sulfa Antibiotics Itching and Swelling    Itching, hives, swelling of face  . Baclofen Other (See Comments)    Leg spasms, jittery, heart racing   . Clonazepam     Males me mean  . Lorazepam Other (See Comments)    "makes me real short-tempered, mean"    HOME MEDICATIONS: Outpatient Medications Prior to Visit  Medication Sig Dispense Refill  .  aspirin 81 MG tablet Take 1 tablet (81 mg total) by mouth daily.    [name] atorvastatin (LIPITOR) 20 MG tablet Take 1 tablet by mouth daily.  3  . BETASERON 0.3 MG KIT injection Inject 0.3 mg into the skin every other day. 14 kit 5  . cholecalciferol (VITAMIN D) 1000 UNITS tablet Take 1,000 Units by mouth daily.    . Dulaglutide (TRULICITY) 5.62 BW/3.8LH SOPN Inject 1.5 mg into the skin once a week.     . ferrous gluconate (FERGON) 324 MG tablet Take 324 mg by mouth daily with breakfast.    . hydrOXYzine (ATARAX/VISTARIL) 10 MG tablet Take 1 tablet (10 mg total) by mouth 3 (three) times daily as needed. 90 tablet 1  . lisinopril (PRINIVIL,ZESTRIL) 10 MG tablet Take 1 tablet (10 mg total) by mouth daily.    . metFORMIN (GLUCOPHAGE) 1000 MG tablet Take 1 tablet (1,000 mg total)  by mouth 2 (two) times daily with a meal.    . rOPINIRole (REQUIP) 1 MG tablet TAKE 1 TO 3 TABLETS BY MOUTH AT BEDTIME 90 tablet 5  . sertraline (ZOLOFT) 100 MG tablet     . traMADol-acetaminophen (ULTRACET) 37.5-325 MG tablet Take 1 tablet by mouth 2 (two) times daily as needed. 60 tablet 5  . vitamin B-12 (CYANOCOBALAMIN) 1000 MCG tablet Take 1,000 mcg by mouth daily.    . ferrous sulfate 325 (65 FE) MG tablet Take 325 mg by mouth daily with breakfast.    . sertraline (ZOLOFT) 50 MG tablet Take 2 tablets (100 mg total) by mouth daily. 180 tablet 3   No facility-administered medications prior to visit.    PAST MEDICAL HISTORY: Past Medical History:  Diagnosis Date  . Anemia   . Diabetes mellitus   . Hyperlipemia   . Hypertension   . Multiple sclerosis ([place])   . RLS (restless legs syndrome)   . Type 2 diabetes mellitus without (mention of) complications     PAST SURGICAL HISTORY: Past Surgical History:  Procedure Laterality Date  . BONE CYST EXCISION    . ESSURE TUBAL LIGATION    . WISDOM TOOTH EXTRACTION  1985    FAMILY HISTORY: Family History  Problem Relation Age of Onset  . Stroke Mother   . Lupus  Sister   . Cancer Maternal Uncle        bone cancer    SOCIAL HISTORY: Social History   Socioeconomic History  . Marital status: Single    Spouse name: Not on file  . Number of children: 0  . Years of education: Bachelor's  . Highest education level: Not on file  Occupational History  . Occupation: Psychiatrist: [employer]   Tobacco Use  . Smoking status: Never Smoker  . Smokeless tobacco: Never Used  Substance and Sexual Activity  . Alcohol use: No  . Drug use: No  . Sexual activity: Not on file  Other Topics Concern  . Not on file  Social History Narrative   Patient is single with no children   Patient is left handed   Patient has a Water quality scientist degree   Patient drinks 20 oz daily   Social Determinants of Health   Financial Resource Strain:   . Difficulty of Paying Living Expenses: Not on file  Food Insecurity:   . Worried About Charity fundraiser in the Last Year: Not on file  . Ran Out of Food in the Last Year: Not on file  Transportation Needs:   . Lack of Transportation (Medical): Not on file  . Lack of Transportation (Non-Medical): Not on file  Physical Activity:   . Days of Exercise per Week: Not on file  . Minutes of Exercise per Session: Not on file  Stress:   . Feeling of Stress : Not on file  Social Connections:   . Frequency of Communication with Friends and Family: Not on file  . Frequency of Social Gatherings with Friends and Family: Not on file  . Attends Religious Services: Not on file  . Active Member of Clubs or Organizations: Not on file  . Attends Archivist Meetings: Not on file  . Marital Status: Not on file  Intimate Partner Violence:   . Fear of Current or Ex-Partner: Not on file  . Emotionally Abused: Not on file  . Physically Abused: Not on file  . Sexually Abused: Not on file  PHYSICAL EXAM  Vitals:   12/19/19 1328  BP: (!) 148/81  Pulse: 75  Temp: (!) 97.1 F (36.2 C)  Weight: 192 lb  (87.1 kg)  Height: '5\' 3"'  (1.6 m)   Body mass index is 34.01 kg/m.  Generalized: Well developed, in no acute distress   Neurological examination  Mentation: Alert oriented to time, place, history taking. Follows all commands speech and language fluent Cranial nerve ,: no change in taste or smell.  Pupils were equal round reactive to light.  Extraocular movements were not full - she has left lazy eye, adducting , causing diplopia. Facial sensation and strength were normal. Uvula/  tongue midline.  Head turning and shoulder shrug  were normal and symmetric. Motor: symmetric motor tone, normal ROm, no rigor.  Sensory: intact to soft touch, vibration and temperature on all 4 extremities.  Coordination: no tremor, no ataxia.  Gait and station:deferred.  Reflexes: Deep tendon reflexes are symmetric bilaterally.  DIAGNOSTIC DATA (LABS, IMAGING, TESTING) - I reviewed patient records, labs, notes, testing and imaging myself where available.     Component Value Date/Time   NA 139 02/06/2019 1132   K 4.4 02/06/2019 1132   CL 103 02/06/2019 1132   CO2 20 02/06/2019 1132   GLUCOSE 102 (H) 02/06/2019 1132   BUN 8 02/06/2019 1132   CREATININE 0.60 02/06/2019 1132   CALCIUM 9.4 02/06/2019 1132   PROT 6.9 02/06/2019 1132   ALBUMIN 4.2 02/06/2019 1132   AST 18 02/06/2019 1132   ALT 15 02/06/2019 1132   ALKPHOS 68 02/06/2019 1132   BILITOT <0.2 02/06/2019 1132   GFRNONAA 105 02/06/2019 1132   GFRAA 121 02/06/2019 1132      ASSESSMENT AND PLAN 54 y.o. year old caucasian female with a long standing MS history, RLS, and new memory complaints. She has myoclonic jerks.    1. Restless leg syndrome -- the patient reports myoclonic jerking, sudden uncontrollable movements sometimes in both legs at the same time but dominantly affecting the right leg..I still believe it is unrelated to the [place] ! 2. History of iron deficiency - Ferritin is critically low at 14.6 per Dr. Delene Ruffini lab.  RLS has  resurfaced worse than before.    3. DM- may be contributing to a neuropathic numbness. NCV / EMG - not likely helpful as deficits are not persistent.    She will continue taking Requip 1 mg once a day.   Since few new lesions , no progression, no reason to change or d/c MS medication.   Remains on Betaseron , she is not interested in tysabri or MAB therapy.   Larey Seat, MD  12/19/2019, 1:57 PM [facility] [address] [place], [place] [address] [phone]

## 2019-12-19 NOTE — Patient Instructions (Signed)

## 2019-12-25 DIAGNOSIS — D509 Iron deficiency anemia, unspecified: Secondary | ICD-10-CM | POA: Diagnosis not present

## 2019-12-25 MED FILL — rOPINIRole HCL 1 MG TABS: 1 | 30 days supply | Qty: 90 | Fill #0

## 2019-12-25 MED FILL — TRULICITY 1.5 MG/0.5 ML PEN: 1.5 | 28 days supply | Qty: 2 | Fill #1

## 2020-01-09 ENCOUNTER — Telehealth: Payer: Self-pay | Admitting: Neurology

## 2020-01-09 MED FILL — metFORMIN HCL 1000 MG TABS: 1000 | 90 days supply | Qty: 180 | Fill #1

## 2020-01-09 MED FILL — TRAMADOL-ACETAMINOPHN 37.5-: 37.5-325 | 30 days supply | Qty: 60 | Fill #5

## 2020-01-09 NOTE — Telephone Encounter (Signed)
Completed Betaseron PA for the patient on cover my meds/ medimpact.  SEL:[id number] Will wait for response.

## 2020-01-09 NOTE — Telephone Encounter (Signed)
PA approved through med impact until 01/06/2021

## 2020-01-10 DIAGNOSIS — E113293 Type 2 diabetes mellitus with mild nonproliferative diabetic retinopathy without macular edema, bilateral: Secondary | ICD-10-CM | POA: Diagnosis not present

## 2020-01-10 DIAGNOSIS — H3582 Retinal ischemia: Secondary | ICD-10-CM | POA: Diagnosis not present

## 2020-01-10 DIAGNOSIS — H3562 Retinal hemorrhage, left eye: Secondary | ICD-10-CM | POA: Diagnosis not present

## 2020-01-10 DIAGNOSIS — H40023 Open angle with borderline findings, high risk, bilateral: Secondary | ICD-10-CM | POA: Diagnosis not present

## 2020-01-10 MED FILL — BETASERON 0.3 MG KIT: 0.3 | 28 days supply | Qty: 14 | Fill #1

## 2020-01-23 MED FILL — rOPINIRole HCL 1 MG TABS: 1 | 30 days supply | Qty: 90 | Fill #1

## 2020-01-23 MED FILL — TRULICITY 1.5 MG/0.5 ML PEN: 1.5 | 28 days supply | Qty: 2 | Fill #2

## 2020-01-31 MED FILL — ATORVASTATIN 20 MG TABLET: 20 | 90 days supply | Qty: 90 | Fill #1

## 2020-01-31 MED FILL — BETAMETHASONE VALERATE 0.1: 0.1 | 14 days supply | Qty: 15 | Fill #1

## 2020-02-06 MED FILL — BETASERON 0.3 MG KIT: 0.3 | 28 days supply | Qty: 14 | Fill #2

## 2020-02-07 ENCOUNTER — Other Ambulatory Visit: Payer: Self-pay | Admitting: Neurology

## 2020-02-08 MED FILL — TRAMADOL-ACETAMINOPHN 37.5-: 37.5-325 | 30 days supply | Qty: 60 | Fill #0

## 2020-02-19 DIAGNOSIS — Z1211 Encounter for screening for malignant neoplasm of colon: Secondary | ICD-10-CM | POA: Diagnosis not present

## 2020-02-19 DIAGNOSIS — D509 Iron deficiency anemia, unspecified: Secondary | ICD-10-CM | POA: Diagnosis not present

## 2020-02-19 DIAGNOSIS — N182 Chronic kidney disease, stage 2 (mild): Secondary | ICD-10-CM | POA: Diagnosis not present

## 2020-02-19 DIAGNOSIS — E1121 Type 2 diabetes mellitus with diabetic nephropathy: Secondary | ICD-10-CM | POA: Diagnosis not present

## 2020-02-20 MED FILL — rOPINIRole HCL 1 MG TABS: 1 | 30 days supply | Qty: 90 | Fill #2

## 2020-02-20 MED FILL — TRULICITY 3 MG/0.5ML SOPN: 3 | 28 days supply | Qty: 2 | Fill #0

## 2020-02-23 DIAGNOSIS — Z1211 Encounter for screening for malignant neoplasm of colon: Secondary | ICD-10-CM | POA: Diagnosis not present

## 2020-02-23 MED FILL — SERTRALINE HCL 100 MG TAB: 100 | 90 days supply | Qty: 90 | Fill #0

## 2020-03-06 ENCOUNTER — Other Ambulatory Visit (HOSPITAL_COMMUNITY): Payer: Self-pay | Admitting: Internal Medicine

## 2020-03-06 MED FILL — LISINOPRIL 10 MG TABS: 10 | 90 days supply | Qty: 90 | Fill #0

## 2020-03-07 MED FILL — BETASERON 0.3 MG KIT: 0.3 | 28 days supply | Qty: 14 | Fill #3

## 2020-03-08 MED FILL — TRAMADOL-ACETAMINOPHN 37.5-: 37.5-325 | 30 days supply | Qty: 60 | Fill #1

## 2020-03-13 ENCOUNTER — Ambulatory Visit (HOSPITAL_BASED_OUTPATIENT_CLINIC_OR_DEPARTMENT_OTHER): Payer: 59 | Admitting: Pharmacist

## 2020-03-13 ENCOUNTER — Other Ambulatory Visit: Payer: Self-pay

## 2020-03-13 DIAGNOSIS — Z79899 Other long term (current) drug therapy: Secondary | ICD-10-CM

## 2020-03-13 NOTE — Progress Notes (Signed)
S: Patient is currently taking Betaseron for MS. Patient is managed by Dr. Vickey Huger for this. Recently saw her 12/19/19. Last MRI was in April of last year. Dr. Vickey Huger noted few new lesions, no progression, and no reason to change or DC medication.  Adherence: denies any missed doses.  Dosing:  0.3 mg every other day.  Drug-drug interactions:none  Monitoring: CBC: completed LFTs: WNL - 02/06/2019 Thyroid function tests: WNL Flu-like symptoms: denies  Neuropsychiatric symptoms: denies Injection-site reactions: yes but manages appropriately MRI: last completed in April of 2020.   O:  Lab Results  Component Value Date   WBC 7.2 08/03/2018   HGB 11.0 (L) 08/03/2018   HCT 34.0 08/03/2018   MCV 89 08/03/2018   PLT 329 08/03/2018      Chemistry      Component Value Date/Time   NA 139 02/06/2019 1132   K 4.4 02/06/2019 1132   CL 103 02/06/2019 1132   CO2 20 02/06/2019 1132   BUN 8 02/06/2019 1132   CREATININE 0.60 02/06/2019 1132      Component Value Date/Time   CALCIUM 9.4 02/06/2019 1132   ALKPHOS 68 02/06/2019 1132   AST 18 02/06/2019 1132   ALT 15 02/06/2019 1132   BILITOT <0.2 02/06/2019 1132       A/P: 1. Medication review: Patient is tolerating Betaseron well with no reported adverse effects. MS has been stable on betaseron. Per neuro note, pt is okay to continue betaseron. No recommendations for any changes.   Butch Penny, PharmD, CPP Clinical Pharmacist [facility] [phone]

## 2020-03-18 MED FILL — TRULICITY 3 MG/0.5ML SOPN: 3 | 28 days supply | Qty: 2 | Fill #1

## 2020-03-19 MED FILL — rOPINIRole HCL 1 MG TABS: 1 | 30 days supply | Qty: 90 | Fill #3

## 2020-04-03 MED FILL — BETAMETHASONE VALERATE 0.1: 0.1 | 14 days supply | Qty: 15 | Fill #2

## 2020-04-04 MED FILL — BETASERON 0.3 MG KIT: 0.3 | 28 days supply | Qty: 14 | Fill #4

## 2020-04-05 MED FILL — TRAMADOL-ACETAMINOPHN 37.5-: 37.5-325 | 30 days supply | Qty: 60 | Fill #2

## 2020-04-29 MED FILL — BETASERON 0.3 MG KIT: 0.3 | 28 days supply | Qty: 14 | Fill #5

## 2020-04-30 MED FILL — ATORVASTATIN 20 MG TABLET: 20 | 90 days supply | Qty: 90 | Fill #2

## 2020-05-22 ENCOUNTER — Other Ambulatory Visit: Payer: Self-pay | Admitting: Neurology

## 2020-05-22 ENCOUNTER — Other Ambulatory Visit: Payer: Self-pay | Admitting: Pharmacist

## 2020-05-22 MED ORDER — BETASERON 0.3 MG ~~LOC~~ KIT: 0.3000 mg | PACK | SUBCUTANEOUS | 5 refills | Status: DC

## 2020-05-23 MED FILL — BETASERON 0.3 MG KIT: 0.3 | 28 days supply | Qty: 14 | Fill #0

## 2020-05-30 MED FILL — SERTRALINE HCL 100 MG TAB: 100 | 90 days supply | Qty: 90 | Fill #1

## 2020-06-12 ENCOUNTER — Other Ambulatory Visit (HOSPITAL_COMMUNITY): Payer: Self-pay | Admitting: Internal Medicine

## 2020-06-12 MED FILL — TRULICITY 3 MG/0.5ML SOPN: 3 | 28 days supply | Qty: 2 | Fill #0

## 2020-06-13 ENCOUNTER — Other Ambulatory Visit: Payer: Self-pay | Admitting: Neurology

## 2020-06-13 DIAGNOSIS — G2581 Restless legs syndrome: Secondary | ICD-10-CM

## 2020-06-13 MED FILL — TRAMADOL-ACETAMINOPHN 37.5-: 37.5-325 | 30 days supply | Qty: 60 | Fill #4

## 2020-06-13 MED FILL — rOPINIRole HCL 1 MG TABS: 1 | 30 days supply | Qty: 90 | Fill #0

## 2020-06-13 MED FILL — LISINOPRIL 10 MG TABS: 10 | 90 days supply | Qty: 90 | Fill #1

## 2020-06-18 ENCOUNTER — Ambulatory Visit: Payer: 59 | Admitting: Neurology

## 2020-06-25 MED FILL — BETASERON 0.3 MG KIT: 0.3 | 28 days supply | Qty: 14 | Fill #1

## 2020-07-02 ENCOUNTER — Ambulatory Visit: Payer: 59 | Admitting: Neurology

## 2020-07-08 MED FILL — TRULICITY 3 MG/0.5ML SOPN: 3 | 28 days supply | Qty: 2 | Fill #1

## 2020-07-09 DIAGNOSIS — E113293 Type 2 diabetes mellitus with mild nonproliferative diabetic retinopathy without macular edema, bilateral: Secondary | ICD-10-CM | POA: Diagnosis not present

## 2020-07-09 DIAGNOSIS — H40023 Open angle with borderline findings, high risk, bilateral: Secondary | ICD-10-CM | POA: Diagnosis not present

## 2020-07-10 MED FILL — BETAMETHASONE VALERATE 0.1: 0.1 | 14 days supply | Qty: 15 | Fill #3

## 2020-07-10 MED FILL — TRAMADOL-ACETAMINOPHN 37.5-: 37.5-325 | 30 days supply | Qty: 60 | Fill #5

## 2020-07-10 MED FILL — rOPINIRole HCL 1 MG TABS: 1 | 30 days supply | Qty: 90 | Fill #1

## 2020-07-23 MED FILL — BETASERON 0.3 MG KIT: 0.3 | 28 days supply | Qty: 14 | Fill #2

## 2020-07-24 ENCOUNTER — Other Ambulatory Visit (HOSPITAL_COMMUNITY): Payer: Self-pay | Admitting: Internal Medicine

## 2020-07-24 MED FILL — ATORVASTATIN 20 MG TABLET: 20 | 90 days supply | Qty: 90 | Fill #3

## 2020-07-24 MED FILL — metFORMIN HCL 1000 MG TABS: 1000 | 90 days supply | Qty: 180 | Fill #0

## 2020-07-31 ENCOUNTER — Telehealth: Payer: Self-pay | Admitting: Neurology

## 2020-07-31 ENCOUNTER — Ambulatory Visit: Payer: 59 | Admitting: Neurology

## 2020-07-31 ENCOUNTER — Other Ambulatory Visit: Payer: Self-pay | Admitting: Neurology

## 2020-07-31 ENCOUNTER — Other Ambulatory Visit: Payer: Self-pay

## 2020-07-31 ENCOUNTER — Encounter: Payer: Self-pay | Admitting: Neurology

## 2020-07-31 VITALS — BP 124/80 | HR 92 | Ht 65.0 in | Wt 181.0 lb

## 2020-07-31 DIAGNOSIS — F39 Unspecified mood [affective] disorder: Secondary | ICD-10-CM

## 2020-07-31 DIAGNOSIS — I159 Secondary hypertension, unspecified: Secondary | ICD-10-CM | POA: Diagnosis not present

## 2020-07-31 DIAGNOSIS — G35 Multiple sclerosis: Secondary | ICD-10-CM

## 2020-07-31 DIAGNOSIS — E0840 Diabetes mellitus due to underlying condition with diabetic neuropathy, unspecified: Secondary | ICD-10-CM | POA: Diagnosis not present

## 2020-07-31 DIAGNOSIS — Z20822 Contact with and (suspected) exposure to covid-19: Secondary | ICD-10-CM | POA: Diagnosis not present

## 2020-07-31 DIAGNOSIS — G2581 Restless legs syndrome: Secondary | ICD-10-CM

## 2020-07-31 DIAGNOSIS — Z8639 Personal history of other endocrine, nutritional and metabolic disease: Secondary | ICD-10-CM | POA: Diagnosis not present

## 2020-07-31 MED ORDER — BACLOFEN 5 MG PO TABS: 5.0000 mg | ORAL_TABLET | Freq: Three times a day (TID) | ORAL | 0 refills | Status: DC

## 2020-07-31 MED ORDER — ROPINIROLE HCL 1 MG PO TABS: ORAL_TABLET | ORAL | 5 refills | Status: DC

## 2020-07-31 MED ORDER — TRAMADOL-ACETAMINOPHEN 37.5-325 MG PO TABS: 1.0000 | ORAL_TABLET | Freq: Two times a day (BID) | ORAL | 5 refills | Status: DC | PRN

## 2020-07-31 MED ORDER — BACLOFEN 10 MG PO TABS: 10.0000 mg | ORAL_TABLET | Freq: Three times a day (TID) | ORAL | 0 refills | Status: DC

## 2020-07-31 MED FILL — BACLOFEN 5 MG TABS: 5 | 30 days supply | Qty: 90 | Fill #0

## 2020-07-31 NOTE — Telephone Encounter (Signed)
I would asked the patient to take 1/2 tablet which equals 5 mg of the results.  If she does experience the same symptoms she had 3 years ago I will immediately switch her to either cyclobenzaprine or tizanidine

## 2020-07-31 NOTE — Patient Instructions (Signed)
Baclofen tablets What is this medicine? BACLOFEN (BAK loe fen) helps relieve spasms and cramping of muscles. It may be used to treat symptoms of multiple sclerosis or spinal cord injury. This medicine may be used for other purposes; ask your health care provider or pharmacist if you have questions. COMMON BRAND NAME(S): ED Baclofen, Lioresal What should I tell my health care provider before I take this medicine? They need to know if you have any of these conditions:  kidney disease  seizures  stroke  an unusual or allergic reaction to baclofen, other medicines, foods, dyes, or preservatives  pregnant or trying to get pregnant  breast-feeding How should I use this medicine? Take this medicine by mouth. Swallow it with a drink of water. Follow the directions on the prescription label. Do not take more medicine than you are told to take. Talk to your pediatrician regarding the use of this medicine in children. Special care may be needed. Overdosage: If you think you have taken too much of this medicine contact a poison control center or emergency room at once. NOTE: This medicine is only for you. Do not share this medicine with others. What if I miss a dose? If you miss a dose, take it as soon as you can. If it is almost time for your next dose, take only that dose. Do not take double or extra doses. What may interact with this medicine? Do not take this medication with any of the following medicines:  narcotic medicines for cough This medicine may also interact with the following medications:  alcohol  antihistamines for allergy, cough and cold  certain medicines for anxiety or sleep  certain medicines for depression like amitriptyline, fluoxetine, sertraline  certain medicines for seizures like phenobarbital, primidone  general anesthetics like halothane, isoflurane, methoxyflurane, propofol  local anesthetics like lidocaine, pramoxine, tetracaine  medicines that relax  muscles for surgery  narcotic medicines for pain  phenothiazines like chlorpromazine, mesoridazine, prochlorperazine, thioridazine This list may not describe all possible interactions. Give your health care provider a list of all the medicines, herbs, non-prescription drugs, or dietary supplements you use. Also tell them if you smoke, drink alcohol, or use illegal drugs. Some items may interact with your medicine. What should I watch for while using this medicine? Tell your doctor or health care professional if your symptoms do not start to get better or if they get worse. Do not suddenly stop taking your medicine. If you do, you may develop a severe reaction. If your doctor wants you to stop the medicine, the dose will be slowly lowered over time to avoid any side effects. Follow the advice of your doctor. You may get drowsy or dizzy. Do not drive, use machinery, or do anything that needs mental alertness until you know how this medicine affects you. Do not stand or sit up quickly, especially if you are an older patient. This reduces the risk of dizzy or fainting spells. Alcohol may interfere with the effect of this medicine. Avoid alcoholic drinks. If you are taking another medicine that also causes drowsiness, you may have more side effects. Give your health care provider a list of all medicines you use. Your doctor will tell you how much medicine to take. Do not take more medicine than directed. Call emergency for help if you have problems breathing or unusual sleepiness. What side effects may I notice from receiving this medicine? Side effects that you should report to your doctor or health care professional as soon as  possible:  allergic reactions like skin rash, itching or hives, swelling of the face, lips, or tongue  breathing problems  changes in emotions or moods  changes in vision  chest pain  fast, irregular heartbeat  feeling faint or lightheaded,  falls  hallucinations  loss of balance or coordination  ringing of the ears  seizures  trouble passing urine or change in the amount of urine  trouble walking  unusually weak or tired Side effects that usually do not require medical attention (report to your doctor or health care professional if they continue or are bothersome):  changes in taste  confusion  constipation  diarrhea  dry mouth  headache  muscle weakness  nausea, vomiting  trouble sleeping This list may not describe all possible side effects. Call your doctor for medical advice about side effects. You may report side effects to FDA at 1-800-FDA-1088. Where should I keep my medicine? Keep out of the reach of children. Store at room temperature between 15 and 30 degrees C (59 and 86 degrees F). Keep container tightly closed. Throw away any unused medicine after the expiration date. NOTE: This sheet is a summary. It may not cover all possible information. If you have questions about this medicine, talk to your doctor, pharmacist, or health care provider.  2020 Elsevier/Gold Standard (2017-09-04 09:56:42)  

## 2020-07-31 NOTE — Telephone Encounter (Signed)
Called the pt to confirm the discussion was made about trying the medication. I will resend the script to the pharmacy as MD is requesting the patient to take it. Pt verbalized understanding.

## 2020-07-31 NOTE — Progress Notes (Addendum)
[name]    PATIENT: Erica Powell DOB: [birth year]  REASON FOR VISIT: follow up HISTORY FROM: patient here alone.    07-31-2020:The patient is here alone, well established 54 year -old MS patient . She states that overall things are well. She lost her companion in February and considers the Covid vaccine the cause of his death at age 89. She misses him daily and deeply. Has reported that she feels is like bone pain. She feels this on tops of arms and legs, is using the Requip and Ultracet.  She states that her RLS has been acting up. Last time ferritin level checked  08/16/19 and was 14.6- low. 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  She takes care of her mother in [place] ( who had a stroke)  and reports recently an increase in RLS and drop in ferritin( 9.9.2020 14.6) . She had a JCV panel in July 2020- negative.  We are discussing starting po iron versus iv- she has taken po iron and has not had a benefit. She reports pounding her legs in pain.     Rv from 06-12-2019, Erica Powell had undergone MRI imaging and we are meeting today to discuss the findings. MRI with slowly progressive demyelination - compared to studies from 2006 on. 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  1 of her next visits will need to be associated with a Moca test. She has had no falls she can wear high heels, no problems with tandem gait heel to walk.   No changes in penmanship.  Reviewed medications there has been no change here she stays on ropinirole at bedtime, takes Betaseron every other day injection 0.3 mg. And  Iron supplements by mouth.   Patent has seen Erica Powell 3 times since my last visit with her, dated  06/28/2017: Erica Powell is returning today for a six-month revisit she had over the last 6 months taking an increased dose of Requip which has not helped her restless legs. She has some myoclonic jerking supple movements in both lower extremities, but mostly on the right. It starts as a painful spasm and then releases suddenly. It is very bothersome. The therapy works has not relieved the distress either. She has started dropping things more than usual, she may drop something right out of her hands. She has sometimes double vision, when she gets fatigue her left eye abducts to the center creating a horizontal diplopia. She also states that she has some cognitive problems forgets what she is doing, sometimes she has to talk to her legs to move, the jerks have gotten worse sometimes it banged up against the desk or buckle and she is walking. In the morning however she is stiff as well as when she stands for long periods at a time or sitting for longer time at the computer. She also has urinary problems. She sweats profusely when she mops,  does homework housework physical work it is not like a hot flash. She wonders if it could be a mass. She states that she feels " pretty much like crap all the time not just the normal tiredness".  HISTORY (Erica Powell): Erica Powell is a 54 y.o. left handed, caucasian female Is seen here as a routine once a year revisit for multiple sclerosis, originally referred from Dr. Laurann Powell.  [name] has been a patient in our neurology clinic for the last 8 years, beginning in 2006.  She was diagnosed with multiple sclerosis and treatment was initiated. She had no recent MS relapses but a brain MRI that had been  performed in January 2014 was showing still scattered periventricular, subcortical and pontine T2 hyperintensities consistent with chronic demyelinating plaques of various age.  No acute lesions were seen in her last MRI obtained on 12/29/2012. She had 2 separate studies in 2006 at [facility]. Initally she had C5 and C 6 lesions, these dorsal cervical cord lesions did not span multiple levels. The patient was diagnosed in 2012 with diabetes and has a strong DM family history. Her last HbA1c just 2 month ago was 7.0 and had been in June 2014 at 8.3. She noticed an improvement in her urinary frequency once her sugars were lower. She also has still no clinical progression or relapse, but she reports problems with concentration, cognition and sometimes with multitasking. She feels that she is easily distracted and loses her train of thought when this happens. She feels especially distracted by noises at her work. 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 There's been no change in her employment, hours of work , hobbies or social life. She has a bachelor's degree ,is single but lives with her fianc. She recently noted a new symptom of tightness in her chest , a paraspinal tenderness and tightness, a confined feeling surrounding the chest , a possible variation of " the MS hug", she has responded well to massage, but the relief is not sustained.   She is a [employer] day [employer].     REVIEW OF SYSTEMS: Out of a complete 14 system review of symptoms, the patient complains only of the following symptoms, and all other reviewed systems are negative.  Trouble swallowing, myoclonic sudden jerking. Not classic RLS, fatigue.   Reports myoclonic jerks. Deep bone pain.  Patient reports deep pain that feels that it is in the bone and this is located to the antebrachium between the elbow and wrist as well as to the lower shin below the knee to the foot ankle lateral right and  sometimes at the hip.  She can take up to 2 requip at night with Ultracet to boot, and is Dr. Nevada Crane.  Only then will she finally find enough relief to go to sleep.  I am not sure that this is the irresistible urge to move as a classic restless leg symptom or if this is a bother at tightness and spasticity and this could be related to MS.  However if the mixture of Ultracet and Requip helps I am happy to prescribe, we have checked her ferritin levels the last time and she received an iron infusion she has not felt that this has made a big difference.  ALLERGIES: Allergies  Allergen Reactions  . Cephalosporins Itching and Swelling    Occurs with cephalexin (Keflex). Starts with prickling around the face, swelling/numb feeling of face.  . Hydrochlorothiazide Itching and Swelling  Swelling of the face, hives, itching, feeling of bad sunburn.   . Penicillins Itching and Swelling    Occurs with Augmentin and amoxicillin. Hives, prickling, swelling of face.   . Sulfa Antibiotics Itching and Swelling    Itching, hives, swelling of face  . Baclofen Other (See Comments)    Leg spasms, jittery, heart racing   . Clonazepam     Males me mean  . Lorazepam Other (See Comments)    "makes me real short-tempered, mean"    HOME MEDICATIONS: Outpatient Medications Prior to Visit  Medication Sig Dispense Refill  . aspirin 81 MG tablet Take 1 tablet (81 mg total) by mouth daily.    [name] atorvastatin (LIPITOR) 20 MG tablet Take 1 tablet by mouth daily.  3  . BETASERON 0.3 MG KIT injection Inject 0.3 mg into the skin every other day. 14 kit 5  . cholecalciferol (VITAMIN D) 1000 UNITS tablet Take 1,000 Units by mouth daily.    . Dulaglutide (TRULICITY) 5.28 UX/3.2GM SOPN Inject 1.5 mg into the skin once a week.     . ferrous gluconate (FERGON) 324 MG tablet Take 324 mg by mouth daily with breakfast.    . hydrOXYzine (ATARAX/VISTARIL) 10 MG tablet Take 1 tablet (10 mg total) by mouth 3 (three) times daily as  needed. 90 tablet 1  . lisinopril (PRINIVIL,ZESTRIL) 10 MG tablet Take 1 tablet (10 mg total) by mouth daily.    . metFORMIN (GLUCOPHAGE) 1000 MG tablet Take 1 tablet (1,000 mg total) by mouth 2 (two) times daily with a meal.    . rOPINIRole (REQUIP) 1 MG tablet TAKE 1 TO 3 TABLETS BY MOUTH AT BEDTIME 90 tablet 5  . sertraline (ZOLOFT) 100 MG tablet     . traMADol-acetaminophen (ULTRACET) 37.5-325 MG tablet TAKE 1 TABLET BY MOUTH TWICE A DAY AS NEEDED. 60 tablet 5  . vitamin B-12 (CYANOCOBALAMIN) 1000 MCG tablet Take 1,000 mcg by mouth daily.     No facility-administered medications prior to visit.    PAST MEDICAL HISTORY: Past Medical History:  Diagnosis Date  . Anemia   . Diabetes mellitus   . Hyperlipemia   . Hypertension   . Multiple sclerosis ([place])   . RLS (restless legs syndrome)   . Type 2 diabetes mellitus without (mention of) complications     PAST SURGICAL HISTORY: Past Surgical History:  Procedure Laterality Date  . BONE CYST EXCISION    . ESSURE TUBAL LIGATION    . WISDOM TOOTH EXTRACTION  1985    FAMILY HISTORY: Family History  Problem Relation Age of Onset  . Stroke Mother   . Lupus Sister   . Cancer Maternal Uncle        bone cancer    SOCIAL HISTORY: Social History   Socioeconomic History  . Marital status: Single    Spouse name: Not on file  . Number of children: 0  . Years of education: Bachelor's  . Highest education level: Not on file  Occupational History  . Occupation: Psychiatrist: [employer]   Tobacco Use  . Smoking status: Never Smoker  . Smokeless tobacco: Never Used  Substance and Sexual Activity  . Alcohol use: No  . Drug use: No  . Sexual activity: Not on file  Other Topics Concern  . Not on file  Social History Narrative   Patient is single with no children   Patient is left handed   Patient has a Bachelor's degree  Patient drinks 20 oz daily   Social Determinants of Health   Financial Resource  Strain:   . Difficulty of Paying Living Expenses: Not on file  Food Insecurity:   . Worried About Charity fundraiser in the Last Year: Not on file  . Ran Out of Food in the Last Year: Not on file  Transportation Needs:   . Lack of Transportation (Medical): Not on file  . Lack of Transportation (Non-Medical): Not on file  Physical Activity:   . Days of Exercise per Week: Not on file  . Minutes of Exercise per Session: Not on file  Stress:   . Feeling of Stress : Not on file  Social Connections:   . Frequency of Communication with Friends and Family: Not on file  . Frequency of Social Gatherings with Friends and Family: Not on file  . Attends Religious Services: Not on file  . Active Member of Clubs or Organizations: Not on file  . Attends Archivist Meetings: Not on file  . Marital Status: Not on file  Intimate Partner Violence:   . Fear of Current or Ex-Partner: Not on file  . Emotionally Abused: Not on file  . Physically Abused: Not on file  . Sexually Abused: Not on file      PHYSICAL EXAM  Vitals:   07/31/20 1328  BP: 124/80  Pulse: 92  Weight: 181 lb (82.1 kg)  Height: '5\' 5"'  (1.651 m)   Body mass index is 30.12 kg/m.  Generalized: Well developed, in some distress - psychological,  grieving for her 41 year companion.   Neurological examination  Mentation: Alert oriented to time, place, history taking. Follows all commands speech and language fluent Cranial nerve ,: no change in taste or smell.  Pupils were equal round reactive to light.  Extraocular movements were not full - she has left lazy eye, adducting , causing diplopia when fatigued.  Facial sensation and strength were normal.  Uvula and tongue midline.  Head turning and shoulder shrug  were normal and symmetric. Motor: symmetric motor tone, normal ROM, no rigor.  Sensory: intact to soft touch, vibration and temperature on all 4 extremities.  Coordination: no tremor, no ataxia.  Gait and  station: intact.   Reflexes: Deep tendon reflexes are symmetrically-elevated , bilaterally- no clonus. [name]  DIAGNOSTIC DATA (LABS, IMAGING, TESTING) - I reviewed patient records, labs, notes, testing and imaging myself where available.     Component Value Date/Time   NA 139 02/06/2019 1132   K 4.4 02/06/2019 1132   CL 103 02/06/2019 1132   CO2 20 02/06/2019 1132   GLUCOSE 102 (H) 02/06/2019 1132   BUN 8 02/06/2019 1132   CREATININE 0.60 02/06/2019 1132   CALCIUM 9.4 02/06/2019 1132   PROT 6.9 02/06/2019 1132   ALBUMIN 4.2 02/06/2019 1132   AST 18 02/06/2019 1132   ALT 15 02/06/2019 1132   ALKPHOS 68 02/06/2019 1132   BILITOT <0.2 02/06/2019 1132   GFRNONAA 105 02/06/2019 1132   GFRAA 121 02/06/2019 1132      ASSESSMENT AND PLAN 54 y.o. year old caucasian female with a long standing MS, relapsing- remitting-  history, RLS, and new memory complaints. She has spasticity in all 4 extremities.   30 minute visit.    1. Restless leg syndrome -- the patient reports myoclonic jerking, sudden uncontrollable movements sometimes in both legs at the same time but dominantly affecting the right leg..  2. Deep bone pain-  I still believe  it is unrelated to the MS ! 2. History of iron deficiency - Ferritin was critically low at 14.6 per Dr. Delene Ruffini lab.  She got IV iron and it did not help.  RLS has resurfaced worse than before.    3. DM- may be contributing to a neuropathic numbness. NCV / EMG - not likely helpful as deficits are not persistent.    She will continue taking Requip 1 mg once a day, but I offered she can take 2 mg extra , just needs to call to adjust prescription.   Since few new lesions , no progression, no reason to change or d/c MS medication. MRI not now- financial restraints, will postpone to January 2022.    Remains on Betaseron , she is not interested in tysabri or MAB therapy. 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 Would like to know AB  status. Covid vaccinated in MAY.  Larey Seat, MD  07/31/2020, 1:49 PM [facility] [address], [place], [place] [address] [phone]

## 2020-07-31 NOTE — Addendum Note (Signed)
Addended by: Melvyn Novas on: 07/31/2020 02:24 PM   Modules accepted: Orders

## 2020-07-31 NOTE — Addendum Note (Signed)
Addended by: Judi Cong on: 07/31/2020 04:16 PM   Modules accepted: Orders

## 2020-07-31 NOTE — Telephone Encounter (Signed)
[facility] Denny Peon) called to inform [facility] in their records Pt is allergic to baclofen (LIORESAL) 10 MG tablet.

## 2020-08-01 ENCOUNTER — Encounter: Payer: Self-pay | Admitting: Neurology

## 2020-08-01 LAB — SAR COV2 SEROLOGY (COVID19)AB(IGG),IA: DiaSorin SARS-CoV-2 Ab, IgG: POSITIVE

## 2020-08-01 NOTE — Progress Notes (Signed)
Erica Powell has AB, so she is not a non-responder. She is due for a third vaccine in September.

## 2020-08-05 MED FILL — TRULICITY 3 MG/0.5ML SOPN: 3 | 28 days supply | Qty: 2 | Fill #2

## 2020-08-07 MED FILL — rOPINIRole HCL 1 MG TABS: 1 | 30 days supply | Qty: 90 | Fill #0

## 2020-08-09 MED FILL — TRAMADOL-ACETAMINOPHN 37.5-: 37.5-325 | 30 days supply | Qty: 60 | Fill #0

## 2020-08-21 MED FILL — BETASERON 0.3 MG KIT: 0.3 | 28 days supply | Qty: 14 | Fill #3

## 2020-09-04 ENCOUNTER — Encounter: Payer: Self-pay | Admitting: Neurology

## 2020-09-04 MED FILL — SERTRALINE HCL 100 MG TAB: 100 | 90 days supply | Qty: 90 | Fill #2

## 2020-09-04 MED FILL — rOPINIRole HCL 1 MG TABS: 1 | 30 days supply | Qty: 90 | Fill #1

## 2020-09-04 MED FILL — TRULICITY 3 MG/0.5ML SOPN: 3 | 28 days supply | Qty: 2 | Fill #3

## 2020-09-06 MED FILL — TRAMADOL-ACETAMINOPHN 37.5-: 37.5-325 | 30 days supply | Qty: 60 | Fill #1

## 2020-09-17 ENCOUNTER — Telehealth: Payer: Self-pay | Admitting: Neurology

## 2020-09-17 NOTE — Telephone Encounter (Signed)
Patient MRI is scheduled for Wednesday 12/11/20. She also informed me the last time she had the MRI with contrast she broke out in a rash on her waist. She states the last time Dr. Vickey Huger gave her a prednisone patch.

## 2020-09-18 ENCOUNTER — Other Ambulatory Visit: Payer: Self-pay | Admitting: Neurology

## 2020-09-18 MED ORDER — METHYLPREDNISOLONE 4 MG PO TBPK: ORAL_TABLET | ORAL | 0 refills | Status: DC

## 2020-09-18 MED FILL — METHYLPREDNISOLONE 4 MG TBP: 4 | 6 days supply | Qty: 21 | Fill #0

## 2020-09-18 MED FILL — BETASERON 0.3 MG KIT: 0.3 | 28 days supply | Qty: 14 | Fill #4

## 2020-09-18 NOTE — Telephone Encounter (Signed)
Dose pack sent for the patient to complete at time of the MRI with contrast. Pt may also take benadryl if needed. Would need a driver if that is the case

## 2020-09-30 MED FILL — TRULICITY 3 MG/0.5ML SOPN: 3 | 28 days supply | Qty: 2 | Fill #4

## 2020-10-01 ENCOUNTER — Other Ambulatory Visit (HOSPITAL_COMMUNITY): Payer: Self-pay | Admitting: Internal Medicine

## 2020-10-01 MED FILL — FREESTYLE LANCETS: 90 days supply | Qty: 200 | Fill #0

## 2020-10-01 MED FILL — FREESTYLE LITE TEST STRIP: 75 days supply | Qty: 150 | Fill #0

## 2020-10-03 MED FILL — LISINOPRIL 10 MG TABS: 10 | 90 days supply | Qty: 90 | Fill #2

## 2020-10-04 MED FILL — rOPINIRole HCL 1 MG TABS: 1 | 30 days supply | Qty: 90 | Fill #2

## 2020-10-14 ENCOUNTER — Other Ambulatory Visit (HOSPITAL_COMMUNITY): Payer: Self-pay | Admitting: Obstetrics and Gynecology

## 2020-10-14 MED FILL — BETAMETHASONE VALERATE 0.1: 0.1 | 7 days supply | Qty: 15 | Fill #0

## 2020-10-16 ENCOUNTER — Other Ambulatory Visit (HOSPITAL_COMMUNITY): Payer: Self-pay | Admitting: Internal Medicine

## 2020-10-16 MED FILL — BETASERON 0.3 MG KIT: 0.3 | 28 days supply | Qty: 14 | Fill #5

## 2020-10-16 MED FILL — ATORVASTATIN 20 MG TABLET: 20 | 90 days supply | Qty: 90 | Fill #0

## 2020-10-18 MED FILL — TRAMADOL-ACETAMINOPHN 37.5-: 37.5-325 | 30 days supply | Qty: 60 | Fill #2

## 2020-10-28 MED FILL — TRULICITY 3 MG/0.5ML SOPN: 3 | 28 days supply | Qty: 2 | Fill #5

## 2020-10-28 MED FILL — METFORMIN HCL 1000 MG TABS: 1000 | 90 days supply | Qty: 180 | Fill #1

## 2020-11-01 MED FILL — rOPINIRole HCL 1 MG TABS: 1 | 30 days supply | Qty: 90 | Fill #3

## 2020-11-06 ENCOUNTER — Other Ambulatory Visit: Payer: Self-pay | Admitting: Neurology

## 2020-11-07 ENCOUNTER — Other Ambulatory Visit: Payer: Self-pay | Admitting: Pharmacist

## 2020-11-07 MED ORDER — BETASERON 0.3 MG ~~LOC~~ KIT: 0.3000 mg | PACK | SUBCUTANEOUS | 5 refills | Status: DC

## 2020-11-11 MED FILL — BETASERON 0.3 MG KIT: 0.3 | 28 days supply | Qty: 14 | Fill #0

## 2020-11-26 MED FILL — TRAMADOL-ACETAMINOPHN 37.5-: 37.5-325 | 30 days supply | Qty: 60 | Fill #3

## 2020-11-26 MED FILL — TRULICITY 3 MG/0.5ML SOPN: 3 | 28 days supply | Qty: 2 | Fill #6

## 2020-11-26 MED FILL — SERTRALINE HCL 100 MG TAB: 100 | 90 days supply | Qty: 90 | Fill #3

## 2020-11-28 DIAGNOSIS — E1121 Type 2 diabetes mellitus with diabetic nephropathy: Secondary | ICD-10-CM | POA: Diagnosis not present

## 2020-11-28 DIAGNOSIS — Z0000 Encounter for general adult medical examination without abnormal findings: Secondary | ICD-10-CM | POA: Diagnosis not present

## 2020-11-28 DIAGNOSIS — E113393 Type 2 diabetes mellitus with moderate nonproliferative diabetic retinopathy without macular edema, bilateral: Secondary | ICD-10-CM | POA: Diagnosis not present

## 2020-11-28 DIAGNOSIS — E7800 Pure hypercholesterolemia, unspecified: Secondary | ICD-10-CM | POA: Diagnosis not present

## 2020-11-28 DIAGNOSIS — D509 Iron deficiency anemia, unspecified: Secondary | ICD-10-CM | POA: Diagnosis not present

## 2020-11-28 DIAGNOSIS — G35 Multiple sclerosis: Secondary | ICD-10-CM | POA: Diagnosis not present

## 2020-11-28 DIAGNOSIS — N182 Chronic kidney disease, stage 2 (mild): Secondary | ICD-10-CM | POA: Diagnosis not present

## 2020-11-28 DIAGNOSIS — I10 Essential (primary) hypertension: Secondary | ICD-10-CM | POA: Diagnosis not present

## 2020-12-11 ENCOUNTER — Other Ambulatory Visit: Payer: Self-pay

## 2020-12-11 ENCOUNTER — Ambulatory Visit: Payer: 59

## 2020-12-11 DIAGNOSIS — I159 Secondary hypertension, unspecified: Secondary | ICD-10-CM

## 2020-12-11 DIAGNOSIS — F39 Unspecified mood [affective] disorder: Secondary | ICD-10-CM

## 2020-12-11 DIAGNOSIS — Z8639 Personal history of other endocrine, nutritional and metabolic disease: Secondary | ICD-10-CM

## 2020-12-11 DIAGNOSIS — G35 Multiple sclerosis: Secondary | ICD-10-CM | POA: Diagnosis not present

## 2020-12-11 DIAGNOSIS — E0840 Diabetes mellitus due to underlying condition with diabetic neuropathy, unspecified: Secondary | ICD-10-CM

## 2020-12-11 MED ORDER — GADOBENATE DIMEGLUMINE 529 MG/ML IV SOLN
10.0000 mL | Freq: Once | INTRAVENOUS | Status: AC | PRN
  Administered 2020-12-11: 10 mL via INTRAVENOUS

## 2020-12-11 MED FILL — rOPINIRole HCL 1 MG TABS: 1 | 30 days supply | Qty: 90 | Fill #4

## 2020-12-13 MED FILL — BETASERON 0.3 MG KIT: 0.3 | 28 days supply | Qty: 14 | Fill #1

## 2020-12-16 ENCOUNTER — Telehealth: Payer: Self-pay

## 2020-12-16 NOTE — Telephone Encounter (Signed)
I called pt. I discussed her MRI results. Pt will continue all meds. Pt will follow up as scheduled on 01/28/2021. Pt verbalized understanding of results. Pt had no questions at this time but was encouraged to call back if questions arise.

## 2020-12-16 NOTE — Progress Notes (Signed)
This MRI of the brain with and without contrast shows the following: 1.   T2/flair hyperintense foci in the pons and hemispheres.  Some of the foci are radially oriented to the ventricles in the periventricular white matter though most of the foci in the hemispheres are nonspecific.  The foci in the pons have an appearance more consistent with chronic microvascular ischemic change than demyelination.  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  The findings are most consistent with a mixture of chronic ischemic and chronic demyelinating foci.   None of the foci appear to be acute.   No new lesions compared to the 03/21/2019 MRI.  2.   No acute findings.  Normal enhancement pattern.     INTERPRETING PHYSICIAN:  Richard A. Epimenio Foot, MD, PhD, [place]

## 2020-12-16 NOTE — Telephone Encounter (Signed)
-----   Message from Melvyn Novas, MD sent at 12/16/2020 11:15 AM EST ----- This MRI of the brain with and without contrast shows the following: 1.   T2/flair hyperintense foci in the pons and hemispheres.  Some of the foci are radially oriented to the ventricles in the periventricular white matter though most of the foci in the hemispheres are nonspecific.  The foci in the pons have an appearance more consistent with chronic microvascular ischemic change than demyelination.  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  The findings are most consistent with a mixture of chronic ischemic and chronic demyelinating foci.   None of the foci appear to be acute.   No new lesions compared to the 03/21/2019 MRI.  2.   No acute findings.  Normal enhancement pattern.     INTERPRETING PHYSICIAN:  Richard A. Epimenio Foot, MD, PhD, [place]

## 2020-12-23 MED FILL — TRULICITY 3 MG/0.5ML SOPN: 3 | 28 days supply | Qty: 2 | Fill #7

## 2020-12-30 MED FILL — LISINOPRIL 10 MG TABS: 10 | 90 days supply | Qty: 90 | Fill #3

## 2020-12-31 DIAGNOSIS — Z6830 Body mass index (BMI) 30.0-30.9, adult: Secondary | ICD-10-CM | POA: Diagnosis not present

## 2020-12-31 DIAGNOSIS — Z01419 Encounter for gynecological examination (general) (routine) without abnormal findings: Secondary | ICD-10-CM | POA: Diagnosis not present

## 2020-12-31 DIAGNOSIS — Z1231 Encounter for screening mammogram for malignant neoplasm of breast: Secondary | ICD-10-CM | POA: Diagnosis not present

## 2021-01-07 ENCOUNTER — Telehealth: Payer: Self-pay

## 2021-01-07 NOTE — Telephone Encounter (Signed)
Received a PA request for Betaseron.  Completed via CMM.  Sent to [employer].  Should have a determination within 3-5 business days. Key: [id number].

## 2021-01-07 NOTE — Telephone Encounter (Signed)
PA for Betaseron was approved by [employer].  It has been approved from January 07, 2021 to January 06, 2022.  Erica Powell outpatient pharmacy has been notified.

## 2021-01-09 MED FILL — TRAMADOL-ACETAMINOPHN 37.5-: 37.5-325 | 30 days supply | Qty: 60 | Fill #4

## 2021-01-09 MED FILL — rOPINIRole HCL 1 MG TABS: 1 | 30 days supply | Qty: 90 | Fill #5

## 2021-01-09 MED FILL — BETASERON 0.3 MG KIT: 0.3 | 28 days supply | Qty: 14 | Fill #2

## 2021-01-11 ENCOUNTER — Other Ambulatory Visit: Payer: Self-pay

## 2021-01-11 ENCOUNTER — Encounter: Payer: Self-pay | Admitting: Emergency Medicine

## 2021-01-11 ENCOUNTER — Emergency Department
Admission: EM | Admit: 2021-01-11 | Discharge: 2021-01-11 | Disposition: A | Discharge status: Home / Self Care | Payer: 59 | Source: Home / Self Care

## 2021-01-11 DIAGNOSIS — U071 COVID-19: Secondary | ICD-10-CM

## 2021-01-11 DIAGNOSIS — J029 Acute pharyngitis, unspecified: Secondary | ICD-10-CM

## 2021-01-11 DIAGNOSIS — R509 Fever, unspecified: Secondary | ICD-10-CM

## 2021-01-11 LAB — POC SARS CORONAVIRUS 2 AG -  ED: SARS Coronavirus 2 Ag: POSITIVE — AB

## 2021-01-11 LAB — POCT RAPID STREP A (OFFICE): Rapid Strep A Screen: NEGATIVE

## 2021-01-11 NOTE — ED Provider Notes (Signed)
[facility]    CSN: [id number] Arrival date & time: 01/11/21  0901      History   Chief Complaint Chief Complaint  Patient presents with  . Sore Throat  . Cough    HPI Tamisha A Waites is a 55 y.o. female.   HPI Zehava A Fallert is a 55 y.o. female presenting to UC with c/o sore throat that started yesterday, preceded by chills for 6 days.  She took Nyquil last night, chills resolved.  Throat pain is worse with swallowing. Denies cough or congestion. She had COVID vaccine 11/2019. No booster. No known sick contacts but she does work in [employer]. Denies fever, n/v/d.    Past Medical History:  Diagnosis Date  . Anemia   . Diabetes mellitus   . Hyperlipemia   . Hypertension   . Multiple sclerosis ([place])   . RLS (restless legs syndrome)   . Type 2 diabetes mellitus without (mention of) complications     Patient Active Problem List   Diagnosis Date Noted  . Mood disorder ([place]) 12/19/2019  . Diabetes mellitus due to underlying condition with diabetic neuropathy, without long-term current use of insulin ([place]) 11/07/2013  . Multiple sclerosis, relapsing-remitting ([place]) 10/16/2011  . Hypertension 10/16/2011  . Hypercholesterolemia 10/16/2011  . Headache(784.0) 10/16/2011  . Diabetes mellitus 10/15/2008    Past Surgical History:  Procedure Laterality Date  . BONE CYST EXCISION    . ESSURE TUBAL LIGATION    . [place]    OB History   No obstetric history on file.      Home Medications    Prior to Admission medications   Medication Sig Start Date End Date Taking? Authorizing Provider  aspirin 81 MG tablet Take 1 tablet (81 mg total) by mouth daily. 10/16/11  Yes   atorvastatin (LIPITOR) 20 MG tablet Take 1 tablet by mouth daily. 10/14/15  Yes [provider]  BETASERON 0.3 MG KIT injection Inject 0.3 mg into the skin every other day. 11/07/20  Yes Tresa Garter, MD  cholecalciferol (VITAMIN D) 1000 UNITS tablet Take  1,000 Units by mouth daily.   Yes [provider]  ferrous gluconate (FERGON) 324 MG tablet Take 324 mg by mouth daily with breakfast.   Yes [provider]  lisinopril (PRINIVIL,ZESTRIL) 10 MG tablet Take 1 tablet (10 mg total) by mouth daily. 10/16/11  Yes   metFORMIN (GLUCOPHAGE) 1000 MG tablet Take 1 tablet (1,000 mg total) by mouth 2 (two) times daily with a meal. 10/16/11  Yes   sertraline (ZOLOFT) 100 MG tablet  05/11/19  Yes [provider]  traMADol-acetaminophen (ULTRACET) 37.5-325 MG tablet Take 1 tablet by mouth 2 (two) times daily as needed. 07/31/20  Yes Dohmeier, Asencion Partridge, MD  vitamin B-12 (CYANOCOBALAMIN) 1000 MCG tablet Take 1,000 mcg by mouth daily.   Yes [provider]  baclofen 5 MG TABS Take 5 mg by mouth 3 (three) times daily. Patient not taking: Reported on 01/11/2021 07/31/20   Dohmeier, Asencion Partridge, MD  Dulaglutide 0.75 MG/0.5ML SOPN Inject 1.5 mg into the skin once a week.     [provider]  hydrOXYzine (ATARAX/VISTARIL) 10 MG tablet Take 1 tablet (10 mg total) by mouth 3 (three) times daily as needed. 07/26/19   Dohmeier, Asencion Partridge, MD  methylPREDNISolone (MEDROL DOSEPAK) 4 MG TBPK tablet Follow dose pack instructions. Start with 6 pills and decrease by 1 pill each day until complete. Complete this for pre medication for upcoming MRI Patient  not taking: Reported on 01/11/2021 09/18/20   Dohmeier, Asencion Partridge, MD  rOPINIRole (REQUIP) 1 MG tablet 2 at night with optional 3 dose. po 07/31/20   Dohmeier, Asencion Partridge, MD    Family History Family History  Problem Relation Age of Onset  . Stroke Mother   . Lupus Sister   . Cancer Maternal Uncle        bone cancer    Social History Social History   Tobacco Use  . Smoking status: Never Smoker  . Smokeless tobacco: Never Used  Substance Use Topics  . Alcohol use: No  . Drug use: No     Allergies   Cephalosporins, Hydrochlorothiazide, Penicillins, Sulfa antibiotics, Baclofen, Clonazepam, and  Lorazepam   Review of Systems Review of Systems  Constitutional: Positive for chills and fever.  HENT: Positive for sore throat. Negative for congestion, ear pain, trouble swallowing and voice change.   Respiratory: Negative for cough and shortness of breath.   Cardiovascular: Negative for chest pain and palpitations.  Gastrointestinal: Negative for abdominal pain, diarrhea, nausea and vomiting.  Musculoskeletal: Negative for arthralgias, back pain and myalgias.  Skin: Negative for rash.  Neurological: Negative for dizziness, light-headedness and headaches.  All other systems reviewed and are negative.    Physical Exam Triage Vital Signs ED Triage Vitals  Enc Vitals Group     BP 01/11/21 0926 131/83     Pulse Rate 01/11/21 0926 (!) 102     Resp 01/11/21 0926 16     Temp 01/11/21 0926 100.1 F (37.8 C)     Temp Source 01/11/21 0926 Oral     SpO2 01/11/21 0926 97 %     Weight 01/11/21 0928 176 lb (79.8 kg)     Height 01/11/21 0928 '5\' 5"'  (1.651 m)     Head Circumference --      Peak Flow --      Pain Score 01/11/21 0928 7     Pain Loc --      Pain Edu? --      Excl. in [place]? --    No data found.  Updated Vital Signs BP 131/83 (BP Location: Right Arm)   Pulse (!) 102   Temp 100.1 F (37.8 C) (Oral)   Resp 16   Ht '5\' 5"'  (1.651 m)   Wt 176 lb (79.8 kg)   LMP 12/02/2013 Comment: essure procedue  SpO2 97%   BMI 29.29 kg/m   Visual Acuity Right Eye Distance:   Left Eye Distance:   Bilateral Distance:    Right Eye Near:   Left Eye Near:    Bilateral Near:     Physical Exam Vitals and nursing note reviewed.  Constitutional:      General: She is not in acute distress.    Appearance: She is well-developed and well-nourished. She is not ill-appearing, toxic-appearing or diaphoretic.  HENT:     Head: Normocephalic and atraumatic.     Right Ear: Tympanic membrane and ear canal normal.     Left Ear: Tympanic membrane and ear canal normal.     Nose: Nose normal.      Right Sinus: No maxillary sinus tenderness or frontal sinus tenderness.     Left Sinus: No maxillary sinus tenderness or frontal sinus tenderness.     Mouth/Throat:     Lips: Pink.     Mouth: Mucous membranes are moist.     Pharynx: Oropharynx is clear. Uvula midline. Posterior oropharyngeal erythema present. No pharyngeal swelling, oropharyngeal exudate or uvula swelling.  Eyes:     Extraocular Movements: EOM normal.  Cardiovascular:     Rate and Rhythm: Normal rate and regular rhythm.  Pulmonary:     Effort: Pulmonary effort is normal. No respiratory distress.     Breath sounds: Normal breath sounds. No stridor. No wheezing, rhonchi or rales.  Musculoskeletal:        General: Normal range of motion.     Cervical back: Normal range of motion and neck supple.  Lymphadenopathy:     Cervical: Cervical adenopathy present.  Skin:    General: Skin is warm and dry.  Neurological:     Mental Status: She is alert and oriented to person, place, and time.  Psychiatric:        Mood and Affect: Mood and affect normal.        Behavior: Behavior normal.      UC Treatments / Results  Labs (all labs ordered are listed, but only abnormal results are displayed) Labs Reviewed  POC SARS CORONAVIRUS 2 AG -  ED - Abnormal; Notable for the following components:      Result Value   SARS Coronavirus 2 Ag Positive (*)    All other components within normal limits  POCT RAPID STREP A (OFFICE)    EKG   Radiology No results found.  Procedures Procedures (including critical care time)  Medications Ordered in UC Medications - No data to display  Initial Impression / Assessment and Plan / UC Course  I have reviewed the triage vital signs and the nursing notes.  Pertinent labs & imaging results that were available during my care of the patient were reviewed by me and considered in my medical decision making (see chart for details).     Rapid strep: NEGATIVE Rapid COVID: POSITIVE Encouraged  symptomatic tx Encouraged to f/u with PCP and [facility] for guidance on return to work and timeframe for receiving booster after infection. Discussed symptoms that warrant emergent care in the ED. AVS given  Final Clinical Impressions(s) / UC Diagnoses   Final diagnoses:  Acute pharyngitis, unspecified etiology  Chills with fever  COVID     Discharge Instructions      You may take 588m acetaminophen every 4-6 hours or in combination with ibuprofen 400-6069mevery 6-8 hours as needed for pain, inflammation, and fever.  Be sure to well hydrated with clear liquids and get at least 8 hours of sleep at night, preferably more while sick.   Please follow up with family medicine in 1 week if needed.  CDC recommends isolation for 5 days from symptom onset and fever free for 24 hours without use of fever reducing medication and symptoms improving. Then wear a well fitting mask for 5 days when around others. If not improving, isolate for 10 days and consider reevaluation by a medical provider to rule out a secondary bacterial infection.   Call 911 or have someone drive you to the hospital if symptoms significantly worsening.      ED Prescriptions    None     PDMP not reviewed this encounter.   PhNoe GensPAVermont2/05/22 1409

## 2021-01-11 NOTE — Discharge Instructions (Signed)
  You may take 500mg acetaminophen every 4-6 hours or in combination with ibuprofen 400-600mg every 6-8 hours as needed for pain, inflammation, and fever. ° °Be sure to well hydrated with clear liquids and get at least 8 hours of sleep at night, preferably more while sick.  ° °Please follow up with family medicine in 1 week if needed. ° °CDC recommends isolation for 5 days from symptom onset and fever free for 24 hours without use of fever reducing medication and symptoms improving. Then wear a well fitting mask for 5 days when around others. If not improving, isolate for 10 days and consider reevaluation by a medical provider to rule out a secondary bacterial infection.  ° °Call 911 or have someone drive you to the hospital if symptoms significantly worsening. ° ° °

## 2021-01-11 NOTE — ED Triage Notes (Signed)
Sore throat since yesterday  Chills since Monday Denies fever  Hurts to swallow - min PO  Intake since yesterday  COVID vaccine 12/20 - no booster

## 2021-01-21 MED FILL — ATORVASTATIN CALCIUM 20 MG: 20 | 90 days supply | Qty: 90 | Fill #1

## 2021-01-21 MED FILL — METFORMIN HCL 1000 MG TABS: 1000 | 90 days supply | Qty: 180 | Fill #2

## 2021-01-21 MED FILL — TRULICITY 3 MG/0.5ML SOPN: 3 | 28 days supply | Qty: 2 | Fill #8

## 2021-01-27 MED FILL — BETAMETHASONE VALERATE 0.1: 0.1 | 7 days supply | Qty: 15 | Fill #1

## 2021-01-28 ENCOUNTER — Ambulatory Visit: Payer: 59 | Admitting: Neurology

## 2021-01-28 ENCOUNTER — Other Ambulatory Visit: Payer: Self-pay | Admitting: Neurology

## 2021-01-28 ENCOUNTER — Encounter: Payer: Self-pay | Admitting: Neurology

## 2021-01-28 DIAGNOSIS — G2581 Restless legs syndrome: Secondary | ICD-10-CM

## 2021-01-28 MED ORDER — ROPINIROLE HCL 2 MG PO TABS: 2.0000 mg | ORAL_TABLET | Freq: Every day | ORAL | 5 refills | Status: DC

## 2021-01-28 MED FILL — rOPINIRole HCL 2 MG TABS: 2 | 30 days supply | Qty: 60 | Fill #0

## 2021-01-28 NOTE — Patient Instructions (Signed)
Ropinirole Oral Tablets What is this medicine? ROPINIROLE (roe PIN i role) is used to treat symptoms of Parkinson's disease. It is also used to treat Restless Legs Syndrome. This medicine may be used for other purposes; ask your health care provider or pharmacist if you have questions. COMMON BRAND NAME(S): Requip What should I tell my health care provider before I take this medicine? They need to know if you have any of these conditions:  heart disease  high blood pressure  kidney disease  liver disease  low blood pressure  narcolepsy  sleep apnea  smoke tobacco cigarettes  an unusual or allergic reaction to ropinirole, other medicines, foods, dyes, or preservatives  pregnant or trying to get pregnant  breast-feeding How should I use this medicine? Take this medicine by mouth with water. Take it as directed on the prescription label. You can take it with or without food. If it upsets your stomach, take it with food. Keep taking this medicine unless your health care provider tells you to stop. Stopping it too quickly can cause serious side effects. It can also make your condition worse. Talk to your health care provider about the use of this medicine in children. Special care may be needed. Overdosage: If you think you have taken too much of this medicine contact a poison control center or emergency room at once. NOTE: This medicine is only for you. Do not share this medicine with others. What if I miss a dose? If you miss a dose, take it as soon as you can. If it is almost time for your next dose, take only that dose. Do not take double or extra doses. What may interact with this medicine?  alcohol  antihistamines for allergy, cough and cold  certain medicines for depression, anxiety, or psychotic disturbances  certain medicines for seizures like phenobarbital, primidone  certain medicines for sleep  ciprofloxacin  female hormones, like estrogens and birth control  pills  fluvoxamine  general anesthetics like halothane, isoflurane, methoxyflurane, propofol  medicines for blood pressure  medicines that relax muscles for surgery  metoclopramide  narcotic medicines for pain  rifampin  tobacco smoking This list may not describe all possible interactions. Give your health care provider a list of all the medicines, herbs, non-prescription drugs, or dietary supplements you use. Also tell them if you smoke, drink alcohol, or use illegal drugs. Some items may interact with your medicine. What should I watch for while using this medicine? Visit your health care provider for regular checks on your progress. Tell your health care provider if your symptoms do not start to get better or if they get worse. Do not suddenly stop taking this medicine. You may develop a severe reaction. Your health care provider will tell you how much medicine to take. If your health care provider wants you to stop the medicine, the dose may be slowly lowered over time to avoid any side effects. You may get drowsy or dizzy. Do not drive, use machinery, or do anything that needs mental alertness until you know how this drug affects you. Do not stand or sit up quickly, especially if you are an older patient. This reduces the risk of dizzy or fainting spells. Alcohol may interfere with the effect of this medicine. Avoid alcoholic drinks. When taking this medicine, you may fall asleep without notice. You may be doing activities like driving a car, talking, or eating. You may not feel drowsy before it happens. Contact your health care provider right away   if this happens to you. There have been reports of increased sexual urges or other strong urges such as gambling while taking this medicine. If you experience any of these while taking this medicine, you should report this to your health care provider as soon as possible. Your mouth may get dry. Chewing sugarless gum or sucking hard candy and  drinking plenty of water may help. Contact your health care provider if the problem does not go away or is severe. What side effects may I notice from receiving this medicine? Side effects that you should report to your doctor or health care professional as soon as possible:  allergic reactions (skin rash, itching or hives; swelling of the face, lips, or tongue)  changes in emotions or moods  changes in vision  confusion  depressed mood  increased blood pressure  falling asleep during normal activities like driving  fast, irregular heartbeat  hallucinations  low blood pressure (dizziness; feeling faint or lightheaded, falls; unusually weak or tired)  new or increased gambling urges, sexual urges, uncontrolled spending, binge or compulsive eating, or other urges  uncontrollable head, mouth, neck, arm, or leg movements Side effects that usually do not require medical attention (report to your doctor or health care professional if they continue or are bothersome):  constipation  dizziness  drowsiness  dry mouth  headache  nausea This list may not describe all possible side effects. Call your doctor for medical advice about side effects. You may report side effects to FDA at 1-800-FDA-1088. Where should I keep my medicine? Keep out of the reach of children and pets. Store at room temperature between 20 and 25 degrees C (68 and 77 degrees F). Protect from light and moisture. Keep the container tightly closed. Get rid of any unused medicine after the expiration date. To get rid of medicines that are no longer needed or have expired:  Take the medicine to a medicine take-back program. Check with your pharmacy or law enforcement to find a location.  If you cannot return the medicine, check the label or package insert to see if the medicine should be thrown out in the garbage or flushed down the toilet. If you are not sure, ask your health care provider. If it is safe to put it  in the trash, take the medicine out of the container. Mix the medicine with cat litter, dirt, coffee grounds, or other unwanted substance. Seal the mixture in a bag or container. Put it in the trash. NOTE: This sheet is a summary. It may not cover all possible information. If you have questions about this medicine, talk to your doctor, pharmacist, or health care provider.  2021 Elsevier/Gold Standard (2020-07-16 20:54:24)  Restless Legs Syndrome Restless legs syndrome is a condition that causes uncomfortable feelings or sensations in the legs, especially while sitting or lying down. The sensations usually cause an overwhelming urge to move the legs. The arms can also sometimes be affected. The condition can range from mild to severe. The symptoms often interfere with a person's ability to sleep. What are the causes? The cause of this condition is not known. What increases the risk? The following factors may make you more likely to develop this condition:  Being older than 50.  Pregnancy.  Being a woman. In general, the condition is more common in women than in men.  A family history of the condition.  Having iron deficiency.  Overuse of caffeine, nicotine, or alcohol.  Certain medical conditions, such as kidney disease,   Parkinson's disease, or nerve damage.  Certain medicines, such as those for high blood pressure, nausea, colds, allergies, depression, and some heart conditions. What are the signs or symptoms? The main symptom of this condition is uncomfortable sensations in the legs, such as:  Pulling.  Tingling.  Prickling.  Throbbing.  Crawling.  Burning. Usually, the sensations:  Affect both sides of the body.  Are worse when you sit or lie down.  Are worse at night. These may wake you up or make it difficult to fall asleep.  Make you have a strong urge to move your legs.  Are temporarily relieved by moving your legs. The arms can also be affected, but this is  rare. People who have this condition often have tiredness during the day because of their lack of sleep at night. How is this diagnosed? This condition may be diagnosed based on:  Your symptoms.  Blood tests. In some cases, you may be monitored in a sleep lab by a specialist (a sleep study). This can detect any disruptions in your sleep. How is this treated? This condition is treated by managing the symptoms. This may include:  Lifestyle changes, such as exercising, using relaxation techniques, and avoiding caffeine, alcohol, or tobacco.  Medicines. Anti-seizure medicines may be tried first. Follow these instructions at home: General instructions  Take over-the-counter and prescription medicines only as told by your health care provider.  Use methods to help relieve the uncomfortable sensations, such as: ? Massaging your legs. ? Walking or stretching. ? Taking a cold or hot bath.  Keep all follow-up visits as told by your health care provider. This is important. Lifestyle  Practice good sleep habits. For example, go to bed and get up at the same time every day. Most adults should get 7-9 hours of sleep each night.  Exercise regularly. Try to get at least 30 minutes of exercise most days of the week.  Practice ways of relaxing, such as yoga or meditation.  Avoid caffeine and alcohol.  Do not use any products that contain nicotine or tobacco, such as cigarettes and e-cigarettes. If you need help quitting, ask your health care provider.      Contact a health care provider if:  Your symptoms get worse or they do not improve with treatment. Summary  Restless legs syndrome is a condition that causes uncomfortable feelings or sensations in the legs, especially while sitting or lying down.  The symptoms often interfere with a person's ability to sleep.  This condition is treated by managing the symptoms. You may need to make lifestyle changes or take medicines. This  information is not intended to replace advice given to you by your health care provider. Make sure you discuss any questions you have with your health care provider. Document Revised: 01/12/2020 Document Reviewed: 12/13/2017 Elsevier Patient Education  2021 [employer]vier [employer].  

## 2021-01-28 NOTE — Progress Notes (Signed)
[name]    PATIENT: Erica Powell DOB: 1966-06-05  REASON FOR VISIT: follow up HISTORY FROM: patient here alone.  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 The patient underwent an MRI of the brain on January 6 of this year the MRI showed hyperintense foci in the pons and in the hemispheres some of them are radially oriented in the form of Dawson's fingers, the ones in the pons had the appearance more consistent with microvascular changes than the demyelination.  She has a chronic lacune in the left external capsule this is most likely due to the risk factor of hypertension also patients with diabetes and with migraines can develop lacunar infarct lacunar infarctions.  Nothing was acute on this last MRI that was compared to a study from April 2020. Chronic secondary progressive, MS- no longer relapsing remitting.     07-31-2020:The patient is here alone, well established 55 year -old MS patient . She states that overall things are well. She lost her companion in February and considers the Covid vaccine the cause of his death at age 5. She misses him daily and deeply. Has reported that she feels is like bone pain. She feels this on tops of arms and legs, is using the Requip and Ultracet.  She states that her RLS has been acting up. Last time ferritin level checked  08/16/19 and was 14.6- low. 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  She takes care of her mother in [place] ( who had a stroke)  and reports recently an increase in RLS and drop in ferritin( 9.9.2020 14.6) . She had a JCV panel in July 2020- negative.  We are discussing starting po iron versus iv- she has taken po iron and has not had a benefit. She reports pounding her legs in  pain.     Rv from 06-12-2019, Erica Powell had undergone MRI imaging and we are meeting today to discuss the findings. MRI with slowly progressive demyelination - compared to studies from 2006 on. 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  1 of her next visits will need to be associated with a Moca test. She has had no falls she can wear high heels, no problems with tandem gait heel to walk.  No changes in penmanship.  Reviewed medications there has been no change here she stays on ropinirole at bedtime, takes Betaseron every other day injection 0.3 mg. And  Iron supplements by mouth.   Patent has seen Erica Powell 3 times since my last visit with her, dated  06/28/2017: Erica Powell is returning today for a six-month revisit she had over the last 6 months taking an increased dose of Requip which has not helped her restless legs. She has some myoclonic jerking supple movements in both lower extremities, but mostly on the right. It starts as a painful spasm and then  releases suddenly. It is very bothersome. The therapy works has not relieved the distress either. She has started dropping things more than usual, she may drop something right out of her hands. She has sometimes double vision, when she gets fatigue her left eye abducts to the center creating a horizontal diplopia. She also states that she has some cognitive problems forgets what she is doing, sometimes she has to talk to her legs to move, the jerks have gotten worse sometimes it banged up against the desk or buckle and she is walking. In the morning however she is stiff as well as when she  stands for long periods at a time or sitting for longer time at the computer. She also has urinary problems. She sweats profusely when she mops, does homework housework physical work it is not like a hot flash. She wonders if it could be a mass. She states that she feels " pretty much like crap all the time not just the normal tiredness".  HISTORY (DOHMEIER): Erica Powell is a 55 y.o. left handed, caucasian female Is seen here as a routine once a year revisit for multiple sclerosis, originally referred from Dr. Laurann Montana.  Erica Powell has been a patient in our neurology clinic for the last 8 years, beginning in 2006.  She was diagnosed with multiple sclerosis and treatment was initiated. She had no recent MS relapses but a brain MRI that had been performed in January 2014 was showing still scattered periventricular, subcortical and pontine T2 hyperintensities consistent with chronic demyelinating plaques of various age.  No acute lesions were seen in her last MRI obtained on 12/29/2012. She had 2 separate studies in 2006 at [facility]. Initally she had C5 and C 6 lesions, these dorsal cervical cord lesions did not span multiple levels. The patient was diagnosed in 2012 with diabetes and has a strong DM family history. Her last HbA1c just 2 month ago was 7.0 and had been in June 2014 at 8.3. She noticed an improvement in her urinary frequency once her sugars were lower. She also has still no clinical progression or relapse, but she reports problems with concentration, cognition and sometimes with multitasking. She feels that she is easily distracted and loses her train of thought when this happens. She feels especially distracted by noises at her work. 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 There's been no change in her employment, hours of work , hobbies or social life. She has a bachelor's degree ,is single but lives with her fianc. She recently noted a new  symptom of tightness in her chest , a paraspinal tenderness and tightness, a confined feeling surrounding the chest , a possible variation of " the MS hug", she has responded well to massage, but the relief is not sustained.   She is a [employer] day [employer].     REVIEW OF SYSTEMS: Out of a complete 14 system review of symptoms, the patient complains only of the following symptoms, and all other reviewed systems are negative.  Trouble swallowing, myoclonic sudden jerking. Not classic RLS, fatigue.   Reports myoclonic jerks. Deep bone pain.  Patient reports deep pain that feels that it is in the bone and this is located to the antebrachium between the elbow and wrist as well as to the lower shin below the knee to the foot ankle lateral right and sometimes at the hip.  She can take up to 2 requip at night with Ultracet to boot,  and is Dr. Nevada Crane.  Only then will she finally find enough relief to go to sleep.  I am not sure that this is the irresistible urge to move as a classic restless leg symptom or if this is a bother at tightness and spasticity and this could be related to MS.  However if the mixture of Ultracet and Requip helps I am happy to prescribe, we have checked her ferritin levels the last time and she received an iron infusion she has not felt that this has made a big difference.  ALLERGIES: Allergies  Allergen Reactions  . Cephalosporins Itching and Swelling    Occurs with cephalexin (Keflex). Starts with prickling around the face, swelling/numb feeling of face.  . Hydrochlorothiazide Itching and Swelling    Swelling of the face, hives, itching, feeling of bad sunburn.   . Penicillins Itching and Swelling    Occurs with Augmentin and amoxicillin. Hives, prickling, swelling of face.   . Sulfa Antibiotics Itching and Swelling    Itching, hives, swelling of face  . Baclofen Other (See Comments)    Leg spasms, jittery, heart racing   . Clonazepam     Males me mean  . Lorazepam Other  (See Comments)    "makes me real short-tempered, mean"    HOME MEDICATIONS: Outpatient Medications Prior to Visit  Medication Sig Dispense Refill  . aspirin 81 MG tablet Take 1 tablet (81 mg total) by mouth daily.    [name] atorvastatin (LIPITOR) 20 MG tablet Take 1 tablet by mouth daily.  3  . BETASERON 0.3 MG KIT injection Inject 0.3 mg into the skin every other day. 14 kit 5  . cholecalciferol (VITAMIN D) 1000 UNITS tablet Take 1,000 Units by mouth daily.    . Dulaglutide 0.75 MG/0.5ML SOPN Inject 1.5 mg into the skin once a week.     . ferrous gluconate (FERGON) 324 MG tablet Take 324 mg by mouth daily with breakfast.    . hydrOXYzine (ATARAX/VISTARIL) 10 MG tablet Take 1 tablet (10 mg total) by mouth 3 (three) times daily as needed. 90 tablet 1  . lisinopril (PRINIVIL,ZESTRIL) 10 MG tablet Take 1 tablet (10 mg total) by mouth daily.    . metFORMIN (GLUCOPHAGE) 1000 MG tablet Take 1 tablet (1,000 mg total) by mouth 2 (two) times daily with a meal.    . methylPREDNISolone (MEDROL DOSEPAK) 4 MG TBPK tablet Follow dose pack instructions. Start with 6 pills and decrease by 1 pill each day until complete. Complete this for pre medication for upcoming MRI 21 tablet 0  . rOPINIRole (REQUIP) 1 MG tablet 2 at night with optional 3 dose. po 90 tablet 5  . sertraline (ZOLOFT) 100 MG tablet     . traMADol-acetaminophen (ULTRACET) 37.5-325 MG tablet Take 1 tablet by mouth 2 (two) times daily as needed. 60 tablet 5  . vitamin B-12 (CYANOCOBALAMIN) 1000 MCG tablet Take 1,000 mcg by mouth daily.    . baclofen 5 MG TABS Take 5 mg by mouth 3 (three) times daily. (Patient not taking: Reported on 01/11/2021) 90 tablet 0   No facility-administered medications prior to visit.    PAST MEDICAL HISTORY: Past Medical History:  Diagnosis Date  . Anemia   . Diabetes mellitus   . Hyperlipemia   . Hypertension   . Multiple sclerosis ([place])   . RLS (restless legs syndrome)   . Type 2 diabetes mellitus without  (mention of) complications     PAST SURGICAL HISTORY: Past Surgical History:  Procedure Laterality Date  . BONE CYST EXCISION    . ESSURE TUBAL LIGATION    . WISDOM TOOTH EXTRACTION  1985    FAMILY HISTORY: Family History  Problem Relation Age of Onset  . Stroke Mother   . Lupus Sister   . Cancer Maternal Uncle        bone cancer    SOCIAL HISTORY: Social History   Socioeconomic History  . Marital status: Single    Spouse name: Not on file  . Number of children: 0  . Years of education: Bachelor's  . Highest education level: Not on file  Occupational History  . Occupation: Psychiatrist: [employer]   Tobacco Use  . Smoking status: Never Smoker  . Smokeless tobacco: Never Used  Substance and Sexual Activity  . Alcohol use: No  . Drug use: No  . Sexual activity: Not on file  Other Topics Concern  . Not on file  Social History Narrative   Patient is single with no children   Patient is left handed   Patient has a Water quality scientist degree   Patient drinks 20 oz daily   Social Determinants of Health   Financial Resource Strain: Not on file  Food Insecurity: Not on file  Transportation Needs: Not on file  Physical Activity: Not on file  Stress: Not on file  Social Connections: Not on file  Intimate Partner Violence: Not on file      PHYSICAL EXAM  Vitals:   01/28/21 1329  BP: 132/78  Pulse: 66  Weight: 177 lb (80.3 kg)  Height: '5\' 5"'  (1.651 m)   Body mass index is 29.45 kg/m.  Generalized: Well developed, in some distress - psychological,  grieving for her 64 year companion.   Neurological examination  Mentation: Alert oriented to time, place, history taking. Follows all commands speech and language fluent Cranial nerve ,: no change in taste or smell.  Pupils were equal round reactive to light.  Extraocular movements were not full - she has left lazy eye, adducting , causing diplopia when fatigued.  Facial sensation and strength were  normal.  Uvula and tongue midline.  Head turning and shoulder shrug  were normal and symmetric. Motor: symmetric motor tone, normal ROM, no rigor.  Sensory: intact to soft touch, vibration and temperature on all 4 extremities.  Coordination: no tremor, no ataxia.  Gait and station: intact.   Reflexes: Deep tendon reflexes are symmetrically-elevated , bilaterally- no clonus. [name]  DIAGNOSTIC DATA (LABS, IMAGING, TESTING) - I reviewed patient records, labs, notes, testing and imaging myself where available.     Component Value Date/Time   NA 139 02/06/2019 1132   K 4.4 02/06/2019 1132   CL 103 02/06/2019 1132   CO2 20 02/06/2019 1132   GLUCOSE 102 (H) 02/06/2019 1132   BUN 8 02/06/2019 1132   CREATININE 0.60 02/06/2019 1132   CALCIUM 9.4 02/06/2019 1132   PROT 6.9 02/06/2019 1132   ALBUMIN 4.2 02/06/2019 1132   AST 18 02/06/2019 1132   ALT 15 02/06/2019 1132   ALKPHOS 68 02/06/2019 1132   BILITOT <0.2 02/06/2019 1132   GFRNONAA 105 02/06/2019 1132   GFRAA 121 02/06/2019 1132      ASSESSMENT AND PLAN 55 y.o. year old caucasian female with a long standing MS, relapsing- remitting-  history, RLS, and new memory complaints.  She has noted spasticity in all 4 extremities.  30 minute visit.   0) just contracted COVIF 2-3- 2022,  Febrile and throat was hurting- she went back to work on [employer] day. She has been scheduled for the booster, but has now to wait 3 month.     0. MS - no gait disturbance. No imbalance, no coordination impairment good vision. Left eye some episodic strabism- she only noted it when tired.  This is the" lazy eye".   1. Restless leg syndrome -- the patient reports myoclonic jerking, sudden uncontrollable movements sometimes in both legs at the same time but dominantly affecting the right leg.  2. Deep bone pain-  I still believe it is unrelated to the MS ! PS :  She went to work at 9 Pm and Thursday morning at home she could not sleep because of the legs  jerking, and deep pain. baclofen made her heart race, can't take it.   2. History of iron deficiency - Ferritin was critically low at 14.6 per Dr. Delene Ruffini lab.  She got IV iron and it did not help.  RLS has resurfaced worse than before.    3. DM- may be contributing to a neuropathic numbness.  Discussed again NCV / EMG - not likely helpful as deficits are not persistent.    She will continue taking Requip  But at 2 mg once at night, and can repeat of needed.  Since few new lesions , no progression, no reason to change or d/c MS medication. MRI was unrevealing.   Remains on Betaseron , she is not interested in tysabri or MAB therapy.  Covid vaccinated in MAY, June- no booster yet.   Larey Seat, MD  01/28/2021, 2:13 PM [facility] [address] [place], [place] [address] [phone]

## 2021-01-31 DIAGNOSIS — N879 Dysplasia of cervix uteri, unspecified: Secondary | ICD-10-CM | POA: Diagnosis not present

## 2021-01-31 DIAGNOSIS — N871 Moderate cervical dysplasia: Secondary | ICD-10-CM | POA: Diagnosis not present

## 2021-02-05 MED FILL — BETASERON 0.3 MG KIT: 0.3 | 28 days supply | Qty: 14 | Fill #3

## 2021-02-07 DIAGNOSIS — H33321 Round hole, right eye: Secondary | ICD-10-CM | POA: Diagnosis not present

## 2021-02-07 DIAGNOSIS — E113291 Type 2 diabetes mellitus with mild nonproliferative diabetic retinopathy without macular edema, right eye: Secondary | ICD-10-CM | POA: Diagnosis not present

## 2021-02-07 DIAGNOSIS — H3562 Retinal hemorrhage, left eye: Secondary | ICD-10-CM | POA: Diagnosis not present

## 2021-02-07 DIAGNOSIS — H35033 Hypertensive retinopathy, bilateral: Secondary | ICD-10-CM | POA: Diagnosis not present

## 2021-02-07 DIAGNOSIS — H3582 Retinal ischemia: Secondary | ICD-10-CM | POA: Diagnosis not present

## 2021-03-03 ENCOUNTER — Other Ambulatory Visit (HOSPITAL_COMMUNITY): Payer: Self-pay | Admitting: Internal Medicine

## 2021-03-03 MED FILL — SERTRALINE HCL 100 MG TAB: 100 | 90 days supply | Qty: 90 | Fill #0

## 2021-03-03 MED FILL — BETASERON 0.3 MG KIT: 0.3 | 28 days supply | Qty: 14 | Fill #4

## 2021-03-04 MED FILL — rOPINIRole HCL 2 MG TABS: 2 | 30 days supply | Qty: 60 | Fill #1

## 2021-03-06 ENCOUNTER — Other Ambulatory Visit (HOSPITAL_COMMUNITY): Payer: Self-pay

## 2021-03-17 ENCOUNTER — Telehealth: Payer: Self-pay | Admitting: Pharmacist

## 2021-03-17 NOTE — Telephone Encounter (Signed)
Called patient to schedule an appointment for the [facility]. I was unable to reach the patient so I left a HIPAA-compliant message requesting that the patient return my call.   Luke Van Ausdall, PharmD, BCACP, CPP Clinical Pharmacist [facility] [phone]  

## 2021-03-20 ENCOUNTER — Other Ambulatory Visit (HOSPITAL_COMMUNITY): Payer: Self-pay

## 2021-03-20 MED FILL — Dulaglutide Soln Auto-injector 3 MG/0.5ML: SUBCUTANEOUS | 56 days supply | Qty: 4 | Fill #0 | Status: AC

## 2021-04-01 ENCOUNTER — Other Ambulatory Visit (HOSPITAL_COMMUNITY): Payer: Self-pay

## 2021-04-01 DIAGNOSIS — D509 Iron deficiency anemia, unspecified: Secondary | ICD-10-CM | POA: Diagnosis not present

## 2021-04-01 DIAGNOSIS — G2581 Restless legs syndrome: Secondary | ICD-10-CM | POA: Diagnosis not present

## 2021-04-01 DIAGNOSIS — E1121 Type 2 diabetes mellitus with diabetic nephropathy: Secondary | ICD-10-CM | POA: Diagnosis not present

## 2021-04-01 DIAGNOSIS — I10 Essential (primary) hypertension: Secondary | ICD-10-CM | POA: Diagnosis not present

## 2021-04-01 MED FILL — Betamethasone Valerate Cream 0.1% (Base Equivalent): CUTANEOUS | 7 days supply | Qty: 15 | Fill #0 | Status: AC

## 2021-04-01 MED FILL — Interferon Beta-1b For Inj Kit 0.3 MG: SUBCUTANEOUS | 28 days supply | Qty: 14 | Fill #0 | Status: AC

## 2021-04-02 ENCOUNTER — Other Ambulatory Visit (HOSPITAL_COMMUNITY): Payer: Self-pay

## 2021-04-03 ENCOUNTER — Other Ambulatory Visit (HOSPITAL_COMMUNITY): Payer: Self-pay

## 2021-04-04 ENCOUNTER — Other Ambulatory Visit: Payer: Self-pay | Admitting: Neurology

## 2021-04-04 ENCOUNTER — Other Ambulatory Visit (HOSPITAL_COMMUNITY): Payer: Self-pay

## 2021-04-04 MED FILL — Ropinirole Hydrochloride Tab 2 MG: ORAL | 30 days supply | Qty: 60 | Fill #0 | Status: AC

## 2021-04-07 ENCOUNTER — Other Ambulatory Visit (HOSPITAL_COMMUNITY): Payer: Self-pay

## 2021-04-07 MED ORDER — TRAMADOL-ACETAMINOPHEN 37.5-325 MG PO TABS
1.0000 | ORAL_TABLET | Freq: Two times a day (BID) | ORAL | 5 refills | Status: DC | PRN
  Filled 2021-04-07: qty 60, 30d supply, fill #0
  Filled 2021-07-15: qty 60, 30d supply, fill #1
  Filled 2021-09-08: qty 60, 30d supply, fill #2

## 2021-04-08 ENCOUNTER — Other Ambulatory Visit (HOSPITAL_COMMUNITY): Payer: Self-pay

## 2021-04-08 DIAGNOSIS — Z3202 Encounter for pregnancy test, result negative: Secondary | ICD-10-CM | POA: Diagnosis not present

## 2021-04-08 DIAGNOSIS — N871 Moderate cervical dysplasia: Secondary | ICD-10-CM | POA: Diagnosis not present

## 2021-04-08 DIAGNOSIS — R87612 Low grade squamous intraepithelial lesion on cytologic smear of cervix (LGSIL): Secondary | ICD-10-CM | POA: Diagnosis not present

## 2021-04-15 ENCOUNTER — Other Ambulatory Visit (HOSPITAL_COMMUNITY): Payer: Self-pay

## 2021-04-16 ENCOUNTER — Other Ambulatory Visit (HOSPITAL_COMMUNITY): Payer: Self-pay

## 2021-04-16 MED ORDER — LISINOPRIL 10 MG PO TABS
10.0000 mg | ORAL_TABLET | Freq: Every day | ORAL | 3 refills | Status: DC
  Filled 2021-04-16: qty 90, 90d supply, fill #0
  Filled 2021-07-15: qty 90, 90d supply, fill #1

## 2021-04-23 DIAGNOSIS — R87612 Low grade squamous intraepithelial lesion on cytologic smear of cervix (LGSIL): Secondary | ICD-10-CM | POA: Diagnosis not present

## 2021-04-23 DIAGNOSIS — Z3202 Encounter for pregnancy test, result negative: Secondary | ICD-10-CM | POA: Diagnosis not present

## 2021-04-23 DIAGNOSIS — N871 Moderate cervical dysplasia: Secondary | ICD-10-CM | POA: Diagnosis not present

## 2021-04-23 DIAGNOSIS — Z09 Encounter for follow-up examination after completed treatment for conditions other than malignant neoplasm: Secondary | ICD-10-CM | POA: Diagnosis not present

## 2021-04-30 ENCOUNTER — Other Ambulatory Visit (HOSPITAL_COMMUNITY): Payer: Self-pay

## 2021-04-30 ENCOUNTER — Other Ambulatory Visit: Payer: Self-pay | Admitting: Neurology

## 2021-04-30 MED ORDER — BETASERON 0.3 MG ~~LOC~~ KIT
PACK | SUBCUTANEOUS | 11 refills | Status: DC
  Filled 2021-04-30 – 2021-05-02 (×2): qty 14, 28d supply, fill #0

## 2021-04-30 MED FILL — Atorvastatin Calcium Tab 20 MG (Base Equivalent): ORAL | 90 days supply | Qty: 90 | Fill #0 | Status: AC

## 2021-04-30 MED FILL — Ropinirole Hydrochloride Tab 2 MG: ORAL | 30 days supply | Qty: 60 | Fill #1 | Status: AC

## 2021-05-01 ENCOUNTER — Encounter: Payer: Self-pay | Admitting: Neurology

## 2021-05-01 DIAGNOSIS — I159 Secondary hypertension, unspecified: Secondary | ICD-10-CM

## 2021-05-01 DIAGNOSIS — M791 Myalgia, unspecified site: Secondary | ICD-10-CM

## 2021-05-01 DIAGNOSIS — E0840 Diabetes mellitus due to underlying condition with diabetic neuropathy, unspecified: Secondary | ICD-10-CM

## 2021-05-01 DIAGNOSIS — G4719 Other hypersomnia: Secondary | ICD-10-CM

## 2021-05-01 DIAGNOSIS — D849 Immunodeficiency, unspecified: Secondary | ICD-10-CM

## 2021-05-01 DIAGNOSIS — D649 Anemia, unspecified: Secondary | ICD-10-CM

## 2021-05-01 DIAGNOSIS — G35 Multiple sclerosis: Secondary | ICD-10-CM

## 2021-05-02 ENCOUNTER — Other Ambulatory Visit (HOSPITAL_COMMUNITY): Payer: Self-pay

## 2021-05-07 ENCOUNTER — Encounter: Payer: Self-pay | Admitting: Neurology

## 2021-05-07 ENCOUNTER — Other Ambulatory Visit (HOSPITAL_COMMUNITY): Payer: Self-pay

## 2021-05-07 ENCOUNTER — Ambulatory Visit: Payer: 59 | Admitting: Neurology

## 2021-05-07 VITALS — BP 122/75 | HR 90 | Ht 65.0 in | Wt 179.0 lb

## 2021-05-07 DIAGNOSIS — M791 Myalgia, unspecified site: Secondary | ICD-10-CM | POA: Diagnosis not present

## 2021-05-07 DIAGNOSIS — R251 Tremor, unspecified: Secondary | ICD-10-CM | POA: Diagnosis not present

## 2021-05-07 DIAGNOSIS — D849 Immunodeficiency, unspecified: Secondary | ICD-10-CM

## 2021-05-07 DIAGNOSIS — G35 Multiple sclerosis: Secondary | ICD-10-CM

## 2021-05-07 DIAGNOSIS — E0840 Diabetes mellitus due to underlying condition with diabetic neuropathy, unspecified: Secondary | ICD-10-CM | POA: Diagnosis not present

## 2021-05-07 DIAGNOSIS — D649 Anemia, unspecified: Secondary | ICD-10-CM

## 2021-05-07 DIAGNOSIS — R42 Dizziness and giddiness: Secondary | ICD-10-CM | POA: Diagnosis not present

## 2021-05-07 NOTE — Progress Notes (Signed)
[name]    PATIENT: Erica Powell DOB: 30-Jun-1966  REASON FOR VISIT: follow up HISTORY FROM: patient here alone.    05-07-2021: URGENT WID-Rv for this MS patient with recent excessive fatigue, weakness spells, falls ! Presents to follow up with new problems , question of MS exacerbation. She recently had a surgical procedure M(ay 18th, dr Erica Powell)  and noticed  since then she began to decline. 2 weeks ago she was walking down her steps and slipped on last 3 steps,  bruising her foot. 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 She got off work only to fall asleep in her car in the driveway, overslept by an hour. Couldn't finish her work day,  Her vision has changed, she was unable to keep the car straight in the lane.  Had an episode of slurred speech last Wednesday , a week ago, and her arms felt so heavy "like trembling".  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 The patient underwent an MRI of the brain on January 6 of this year the MRI showed hyperintense foci in the pons and in the hemispheres some of them are radially oriented in the form of Dawson's fingers, the ones in the pons had the appearance more consistent with microvascular changes than the demyelination.  She has a chronic lacune in the left external capsule this is most likely due to the risk factor of hypertension also patients with diabetes and with migraines can develop lacunar infarct lacunar infarctions.  Nothing was acute on this last MRI that was compared to a study from April 2020. Chronic secondary progressive, MS- no longer relapsing remitting.     07-31-2020:The patient is here alone, well established 55 year -old MS patient . She states that overall things are well. She lost her companion in February and considers the Covid vaccine the cause of his death at age 81. She misses him daily and deeply. Has  reported that she feels is like bone pain. She feels this on tops of arms and legs, is using the Requip and Ultracet.  She states that her RLS has been acting up. Last time ferritin level checked  08/16/19 and was 14.6- low. 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  She takes care of her mother in [place] ( who had a stroke)  and reports recently an increase in RLS and drop in ferritin( 9.9.2020 14.6) . She had a Erica Powell in July 2020- negative.  We are discussing starting po iron versus iv- she has taken po iron and has not had a benefit. She reports pounding her legs in pain.     Rv from 06-12-2019, Erica Powell had undergone MRI imaging and we are meeting today to discuss the findings. MRI with slowly progressive demyelination - compared to studies from 2006 on. 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  1 of her next visits will need to be associated with a [place]oca test. She has had no falls she can wear high heels, no problems with tandem gait/ heel -walk.  No changes in penmanship.  Reviewed medications there has been no change here she stays on ropinirole at bedtime, takes Betaseron every other day injection 0.3 mg. And iron supplements by mouth.   Patent has seen Erica Powell 3 times since my last visit with her, dated  06/28/2017: August is returning today for a six-month revisit she had over the last 6 months taking an  increased dose of Requip which has not helped her restless legs. She has some myoclonic jerking supple movements in both lower extremities, but mostly on the right. It starts as a painful spasm and then releases suddenly. It is very bothersome. The therapy works has not relieved the distress either. She has started dropping things more than usual, she may drop something right out of her hands. She has sometimes double vision, when she gets fatigue her left eye abducts to the center creating a horizontal diplopia. She also states that she has some cognitive problems forgets what she is doing, sometimes she has to talk to her legs to move, the jerks have gotten worse sometimes it banged up against the desk or buckle and she is walking. In the morning however she is stiff as well as when she stands for long periods at a time or sitting for longer time at the computer. She also has urinary problems. She sweats profusely when she mops, does homework housework physical work it is not like a hot flash. She wonders if it could be a mass. She states that she feels " pretty much like crap all the time not just the normal tiredness".  HISTORY (Erica Powell): Erica Powell is a 55 y.o. left handed, caucasian female Is seen here as a routine once a year revisit for multiple sclerosis, originally referred from Dr. Laurann Powell.  [place] has been a patient in our neurology clinic for the last 8 years, beginning in 2006.  She was diagnosed with multiple sclerosis and treatment was initiated. She had no recent MS relapses but a brain MRI that had been performed in January 2014 was showing still scattered periventricular, subcortical and pontine T2 hyperintensities consistent with chronic demyelinating plaques of various age.  No acute lesions were seen in her last MRI obtained on 12/29/2012. She had 2 separate studies in 2006 at [facility]. Initally she had C5 and C 6 lesions, these dorsal cervical cord lesions did not span  multiple levels. The patient was diagnosed in 2012 with diabetes and has a strong DM family history. Her last HbA1c just 2 month ago was 7.0 and had been in June 2014 at 8.3. She noticed an improvement in her urinary frequency once her sugars were lower. She also has still no clinical progression or relapse, but she reports problems with concentration, cognition and sometimes with multitasking. She feels that she is easily distracted and loses her train of thought when this happens. She feels especially distracted by noises at her work. 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 There's been no change in her employment, hours of work , hobbies or social life. She has a bachelor's degree ,is single but lives with her fianc. She recently noted a new symptom of tightness in her chest , a paraspinal  tenderness and tightness, a confined feeling surrounding the chest , a possible variation of " the MS hug", she has responded well to massage, but the relief is not sustained.   She is a [employer] day [employer].     REVIEW OF SYSTEMS: Out of a complete 14 system review of symptoms, the patient complains only of the following symptoms, and all other reviewed systems are negative.  She has a new onset tremble, low amplitude , affecting her ability to button.  Very fatigued,   Collapsed into sleep.  Can't keep concentrating at work. .   ALLERGIES: Allergies  Allergen Reactions  . Cephalosporins Itching and Swelling    Occurs with cephalexin (Keflex). Starts with prickling around the face, swelling/numb feeling of face.  . Hydrochlorothiazide Itching and Swelling    Swelling of the face, hives, itching, feeling of bad sunburn.   . Penicillins Itching and Swelling    Occurs with Augmentin and amoxicillin. Hives, prickling, swelling of face.   . Sulfa Antibiotics Itching and Swelling    Itching, hives, swelling of face  . Baclofen Other (See Comments)    Leg spasms,  jittery, heart racing   . Clonazepam     Males me mean  . Lorazepam Other (See Comments)    "makes me real short-tempered, mean"    HOME MEDICATIONS: Outpatient Medications Prior to Visit  Medication Sig Dispense Refill  . aspirin 81 MG tablet Take 1 tablet (81 mg total) by mouth daily.    [name] atorvastatin (LIPITOR) 20 MG tablet Take 1 tablet by mouth daily.  3  . betamethasone valerate (VALISONE) 0.1 % cream APPLY A THIN LAYER TO THE AFFECTED AREA ONCE A DAY 15 g 3  . BETASERON 0.3 MG KIT injection INJECT 0.3 MG INTO THE SKIN EVERY OTHER DAY. 14 kit 11  . cholecalciferol (VITAMIN D) 1000 UNITS tablet Take 1,000 Units by mouth daily.    . Dulaglutide 3 MG/0.5ML SOPN INJECT 3MG UNDER THE SKIN ONCE WEEKLY 2 mL 11  . ferrous gluconate (FERGON) 324 MG tablet Take 324 mg by mouth daily with breakfast.    . glucose blood test strip USE 1 TO CHECK BLOOD GLUCOSE 1 TO 2 TIMES DAILY. (Patient taking differently: USE 1 TO CHECK BLOOD GLUCOSE 1 TO 2 TIMES DAILY.) 200 strip 3  . hydrOXYzine (ATARAX/VISTARIL) 10 MG tablet Take 1 tablet (10 mg total) by mouth 3 (three) times daily as needed. 90 tablet 1  . Lancets (FREESTYLE) lancets USE 1 TO CHECK BLOOD GLUCOSE 1 TO 2 TIMES DAILY AS DIRECTED. (Patient taking differently: USE 1 TO CHECK BLOOD GLUCOSE 1 TO 2 TIMES DAILY AS DIRECTED.) 200 each 3  . lisinopril (PRINIVIL,ZESTRIL) 10 MG tablet Take 1 tablet (10 mg total) by mouth daily.    [name] lisinopril (ZESTRIL) 10 MG tablet Take 1 tablet (10 mg total) by mouth daily. 90 tablet 3  . metFORMIN (GLUCOPHAGE) 1000 MG tablet TAKE 1 TABLET BY MOUTH 2 TIMES DAILY WITH A MEAL. 180 tablet 3  . rOPINIRole (REQUIP) 2 MG tablet TAKE 1-2 TABLETS (2-4 MG TOTAL) BY MOUTH AT BEDTIME. 60 tablet 5  . sertraline (ZOLOFT) 100 MG tablet TAKE 1 TABLET BY MOUTH ONCE A DAY 90 tablet 3  . traMADol-acetaminophen (ULTRACET) 37.5-325 MG tablet Take 1 tablet by mouth 2 (two) times daily as needed. 60 tablet 5  . vitamin B-12  (CYANOCOBALAMIN) 1000 MCG tablet Take 1,000 mcg by mouth daily.    [name] atorvastatin (LIPITOR) 20 MG tablet TAKE 1  TABLET BY MOUTH ONCE A DAY 90 tablet 3  . Dulaglutide 0.75 MG/0.5ML SOPN Inject 1.5 mg into the skin once a week.     [name] lisinopril (ZESTRIL) 10 MG tablet TAKE 1 TABLET BY MOUTH ONCE A DAY 90 tablet 3  . metFORMIN (GLUCOPHAGE) 1000 MG tablet Take 1 tablet (1,000 mg total) by mouth 2 (two) times daily with a meal.    . methylPREDNISolone (MEDROL DOSEPAK) 4 MG TBPK tablet FOLLOW DOSE PACK INSTRUCTIONS. START WITH 6 TABS AND DECREASE BY 1 TAB EACH DAY UNTIL COMPLETE (COMPLETE THIS FOR PREMED FOR UPCOMING MRI) 21 each 0  . sertraline (ZOLOFT) 100 MG tablet      No facility-administered medications prior to visit.    PAST MEDICAL HISTORY: Past Medical History:  Diagnosis Date  . Anemia   . Diabetes mellitus   . Hyperlipemia   . Hypertension   . Multiple sclerosis ([place])   . RLS (restless legs syndrome)   . Type 2 diabetes mellitus without (mention of) complications     PAST SURGICAL HISTORY: Past Surgical History:  Procedure Laterality Date  . BONE CYST EXCISION    . ESSURE TUBAL LIGATION    . WISDOM TOOTH EXTRACTION  1985    FAMILY HISTORY: Family History  Problem Relation Age of Onset  . Stroke Mother   . Lupus Sister   . Cancer Maternal Uncle        bone cancer    SOCIAL HISTORY: Social History   Socioeconomic History  . Marital status: Single    Spouse name: Not on file  . Number of children: 0  . Years of education: Bachelor's  . Highest education level: Not on file  Occupational History  . Occupation: Psychiatrist: [employer]   Tobacco Use  . Smoking status: Never Smoker  . Smokeless tobacco: Never Used  Substance and Sexual Activity  . Alcohol use: No  . Drug use: No  . Sexual activity: Not on file  Other Topics Concern  . Not on file  Social History Narrative   Patient is single with no children   Patient is left handed    Patient has a Water quality scientist degree   Patient drinks 20 oz daily   Social Determinants of Health   Financial Resource Strain: Not on file  Food Insecurity: Not on file  Transportation Needs: Not on file  Physical Activity: Not on file  Stress: Not on file  Social Connections: Not on file  Intimate Partner Violence: Not on file      PHYSICAL EXAM  Vitals:   05/07/21 1549  BP: 122/75  Pulse: 90  Weight: 179 lb (81.2 kg)  Height: '5\' 5"'  (1.651 m)   Body mass index is 29.79 kg/m.   Ankle edema, pale skin, low turgor.   Generalized: Well developed, in some distress - psychological,  grieving for her late companion.   Orthostatic blood pressures were obtained supine position 126/79 with a heart rate of 80 the patient then was seated and in the sitting position 112/72 mmHg blood pressure and 89  bpm regular heart rate - and she felt lightheaded. Standing blood pressure of 121/81 mmHg ,  the heart rate exacerbated to107 bpm.  Neurological examination  Mentation: Alert oriented to time, place, history taking. Follows all commands speech and language fluent Cranial nerve ,: no change in taste or smell.  Pupils were equal round reactive to light.  Extraocular movements were not full - she has left  lazy eye, adducting , causing diplopia when fatigued.  Facial sensation and strength were normal.  Uvula and tongue midline.  Head turning and shoulder shrug  were normal and symmetric. Motor: symmetric motor tone, normal ROM, no rigor.  Sensory: intact to soft touch, vibration and temperature on all 4 extremities.  Coordination: no tremor, no ataxia.  Gait and station: intact.   Reflexes: Deep tendon reflexes are symmetrically-elevated , bilaterally- no clonus. [name]  DIAGNOSTIC DATA (LABS, IMAGING, TESTING) - I reviewed patient records, labs, notes, testing and imaging myself where available.  The patient's last cholesterol level was 111 total total cholesterol over HDL 2.6 ratio HDL D 43  mg/dL triglyceride 96 LDL 50 mg/dL  Slight anemia. Ferritin was  Low at 26.7 mg/  On 11/28/2020     ASSESSMENT AND PLAN 55 y.o. year old caucasian female with a long standing MS, relapsing- remitting-  history, RLS, and new excessive fatigue, sleep attacks, vision changes, lightheadedness and malaise.  0) just contracted COVID  2-3- 2022,  Febrile and throat was hurting- she went back to work on [employer] day. She has been scheduled for the booster, but has now to wait 3 month.  She doesn't feel this new fatigue is related.  She has had literally a " sleep attack"  in the driveway- slept from 8.30- 10.15 .   1). MS -fatigue, and trembling. New gait disturbance- feeling as if trembling inside - she also has trouble keeping in lane when driving/  On BETASERON- - check for Erica I titer and for Vit D, B12 and H and H.  2) lightheadedness, syncope risk- abnormal orthostatics.  I like for the patient to HYDRATE well and if needed , using compression stockings.   RLS has resurfaced worse than before.   DM- may be contributing to a neuropathic numbness.  Discussed again NCV / EMG - not likely helpful as deficits are not persistent.    History of iron deficiency - Ferritin was critically low at 14.6 in 2021 now improved, still low-  per Dr. Delene Ruffini lab.  She got IV iron and it did not help.    FMLA for the next 14 days.  She will continue taking Requip but at 2 mg once at night, and can repeat of needed.  Last 2 MRIs with no indication of progression, no reason to change or d/c MS medication. MRI was unrevealing.  Remains on Betaseron , she was thus far  not interested in tysabri or MAB therapy.  Covid vaccinated in MAY, June- 21 and  Booster.    Larey Seat, MD  05/07/2021, 4:00 PM [facility] [address], [place], [place] [address] [phone]

## 2021-05-07 NOTE — Patient Instructions (Addendum)
  FMLA for intermittent absence due to MS related dysautonomia. Orthostatic dizziness.

## 2021-05-09 ENCOUNTER — Other Ambulatory Visit (HOSPITAL_COMMUNITY): Payer: Self-pay

## 2021-05-10 LAB — PROTEIN ELECTROPHORESIS, SERUM
A/G Ratio: 0.9 (ref 0.7–1.7)
Albumin ELP: 3.7 g/dL (ref 2.9–4.4)
Alpha 1: 0.3 g/dL (ref 0.0–0.4)
Alpha 2: 0.9 g/dL (ref 0.4–1.0)
Beta: 1.2 g/dL (ref 0.7–1.3)
Gamma Globulin: 1.6 g/dL (ref 0.4–1.8)
Globulin, Total: 4 g/dL — ABNORMAL HIGH (ref 2.2–3.9)
Total Protein: 7.7 g/dL (ref 6.0–8.5)

## 2021-05-12 ENCOUNTER — Other Ambulatory Visit (HOSPITAL_COMMUNITY): Payer: Self-pay

## 2021-05-12 ENCOUNTER — Telehealth: Payer: Self-pay | Admitting: *Deleted

## 2021-05-12 NOTE — Progress Notes (Signed)
Elevated GLOBULIN level, no monoclonal protein spike is seen.

## 2021-05-12 NOTE — Telephone Encounter (Signed)
-----   Message from Melvyn Novas, MD sent at 05/12/2021  8:38 AM EDT ----- Elevated GLOBULIN level, no monoclonal protein spike is seen.

## 2021-05-12 NOTE — Telephone Encounter (Signed)
Left message requesting a return call.

## 2021-05-13 ENCOUNTER — Ambulatory Visit: Payer: 59 | Attending: Family Medicine | Admitting: Pharmacist

## 2021-05-13 ENCOUNTER — Other Ambulatory Visit: Payer: Self-pay

## 2021-05-13 ENCOUNTER — Other Ambulatory Visit (HOSPITAL_COMMUNITY): Payer: Self-pay

## 2021-05-13 DIAGNOSIS — Z79899 Other long term (current) drug therapy: Secondary | ICD-10-CM

## 2021-05-13 MED ORDER — TRULICITY 3 MG/0.5ML ~~LOC~~ SOAJ
3.0000 mg | SUBCUTANEOUS | 11 refills | Status: DC
  Filled 2021-05-13: qty 6, 84d supply, fill #0
  Filled 2021-08-13: qty 6, 84d supply, fill #1
  Filled 2021-11-03: qty 6, 84d supply, fill #2

## 2021-05-13 MED ORDER — BETASERON 0.3 MG ~~LOC~~ KIT
PACK | SUBCUTANEOUS | 11 refills | Status: DC
  Filled 2021-05-13: qty 14, fill #0
  Filled 2021-05-28: qty 14, 28d supply, fill #0
  Filled 2021-06-27: qty 14, 28d supply, fill #1
  Filled 2021-07-24 (×2): qty 14, 28d supply, fill #2
  Filled 2021-08-21: qty 14, 28d supply, fill #3
  Filled 2021-09-23: qty 14, 28d supply, fill #4
  Filled 2021-10-20: qty 14, 28d supply, fill #5
  Filled 2021-11-13: qty 14, 28d supply, fill #6
  Filled 2021-12-25: qty 14, 28d supply, fill #7
  Filled 2022-01-13: qty 14, 28d supply, fill #8
  Filled 2022-02-11: qty 14, 28d supply, fill #9
  Filled 2022-03-09: qty 14, 28d supply, fill #10

## 2021-05-13 NOTE — Progress Notes (Signed)
S: Patient is currently taking Betaseron for MS. Patient is managed by Dr. Vickey Huger for this. Recently saw her 05/07/2021. Last MRI was in Jan of this year. Dr. Vickey Huger noted no new lesion.   Adherence: denies any missed doses.  Dosing:  0.3 mg every other day.  Drug-drug interactions:none  Monitoring: CBC: completed LFTs: active but not resulted - 05/01/2021 Thyroid function tests: active but not resulted - 05/01/2021 Flu-like symptoms: denies  Neuropsychiatric symptoms: denies Injection-site reactions: yes but manages appropriately MRI: last completed in Jan of 2022.   O:  Lab Results  Component Value Date   WBC 7.2 08/03/2018   HGB 11.0 (L) 08/03/2018   HCT 34.0 08/03/2018   MCV 89 08/03/2018   PLT 329 08/03/2018      Chemistry      Component Value Date/Time   NA 139 02/06/2019 1132   K 4.4 02/06/2019 1132   CL 103 02/06/2019 1132   CO2 20 02/06/2019 1132   BUN 8 02/06/2019 1132   CREATININE 0.60 02/06/2019 1132      Component Value Date/Time   CALCIUM 9.4 02/06/2019 1132   ALKPHOS 68 02/06/2019 1132   AST 18 02/06/2019 1132   ALT 15 02/06/2019 1132   BILITOT <0.2 02/06/2019 1132       A/P: 1. Medication review: Patient is tolerating Betaseron well with no reported adverse effects. MS has been stable on betaseron per imaging. Per neuro note, pt is okay to continue betaseron. No recommendations for any changes.   Butch Penny, PharmD, P[place]y [place], CPP Clinical Pharmacist [facility] [phone]

## 2021-05-13 NOTE — Telephone Encounter (Signed)
I spoke to the patient and provided her with the lab results. She will keep her pending appt for EEG and follow up.

## 2021-05-13 NOTE — Telephone Encounter (Signed)
Pt has called Marcelino Duster, RN back for the results.  Please call, pt states due to working third shift she will be up until 11:30

## 2021-05-15 ENCOUNTER — Other Ambulatory Visit (HOSPITAL_COMMUNITY): Payer: Self-pay

## 2021-05-16 ENCOUNTER — Other Ambulatory Visit (HOSPITAL_COMMUNITY): Payer: Self-pay

## 2021-05-16 MED ORDER — FLUOCINONIDE 0.05 % EX CREA
1.0000 "application " | TOPICAL_CREAM | Freq: Two times a day (BID) | CUTANEOUS | 1 refills | Status: DC
  Filled 2021-05-16: qty 30, 15d supply, fill #0
  Filled 2021-08-01: qty 30, 15d supply, fill #1

## 2021-05-16 MED FILL — Metformin HCl Tab 1000 MG: ORAL | 90 days supply | Qty: 180 | Fill #0 | Status: AC

## 2021-05-19 ENCOUNTER — Encounter: Payer: Self-pay | Admitting: Neurology

## 2021-05-19 ENCOUNTER — Other Ambulatory Visit: Payer: Self-pay | Admitting: Neurology

## 2021-05-19 ENCOUNTER — Ambulatory Visit: Payer: 59 | Admitting: Neurology

## 2021-05-19 ENCOUNTER — Telehealth: Payer: Self-pay | Admitting: *Deleted

## 2021-05-19 DIAGNOSIS — R251 Tremor, unspecified: Secondary | ICD-10-CM | POA: Diagnosis not present

## 2021-05-19 DIAGNOSIS — D849 Immunodeficiency, unspecified: Secondary | ICD-10-CM

## 2021-05-19 DIAGNOSIS — D649 Anemia, unspecified: Secondary | ICD-10-CM

## 2021-05-19 DIAGNOSIS — G35 Multiple sclerosis: Secondary | ICD-10-CM

## 2021-05-19 DIAGNOSIS — E0840 Diabetes mellitus due to underlying condition with diabetic neuropathy, unspecified: Secondary | ICD-10-CM

## 2021-05-19 NOTE — Telephone Encounter (Signed)
This strong positive JCV Ab titer helps [place] to select future treatments, and would eliminate the Tysabri / Rituximab option. 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 Wenzler should stop her current MS treatment and start one of the safer ones.     Mavenclad is an option, is oral and has only a few treatments in year 1 and 2, then is considered long lasting - no repeat needed.   We are looking to see if a secondary progressive MS treatment is feasible.  Ocrevus is considered an option ( IV )

## 2021-05-19 NOTE — Telephone Encounter (Signed)
Received JCV results from [facility]. Collection date: 05/07/2021.   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

## 2021-05-19 NOTE — Telephone Encounter (Signed)
I sent a note to Dr Epimenio Foot, about further diagnosis / if her symptoms could be related to effects of the [facility] Virus as an active infection.

## 2021-05-20 ENCOUNTER — Other Ambulatory Visit: Payer: Self-pay | Admitting: Neurology

## 2021-05-20 ENCOUNTER — Telehealth: Payer: Self-pay | Admitting: Neurology

## 2021-05-20 ENCOUNTER — Other Ambulatory Visit (HOSPITAL_COMMUNITY): Payer: Self-pay

## 2021-05-20 MED ORDER — PREDNISONE 20 MG PO TABS
20.0000 mg | ORAL_TABLET | ORAL | 0 refills | Status: DC
  Filled 2021-05-20: qty 3, 3d supply, fill #0

## 2021-05-20 NOTE — Telephone Encounter (Signed)
Had already replied to the patient via mychart. Pt has been notified and made aware of plan

## 2021-05-20 NOTE — Telephone Encounter (Signed)
Pt called, getting MRI tomorrow asking me to get 13 hours prep where have a reaction to the contrast. Would like a call from the nurse.

## 2021-05-20 NOTE — Telephone Encounter (Signed)
Pt is scheduled wed for MRI

## 2021-05-21 ENCOUNTER — Other Ambulatory Visit: Payer: Self-pay

## 2021-05-21 ENCOUNTER — Ambulatory Visit
Admission: RE | Admit: 2021-05-21 | Discharge: 2021-05-21 | Disposition: A | Discharge status: Home / Self Care | Payer: 59 | Source: Ambulatory Visit | Attending: Neurology | Admitting: Neurology

## 2021-05-21 DIAGNOSIS — G35 Multiple sclerosis: Secondary | ICD-10-CM | POA: Diagnosis not present

## 2021-05-21 MED ORDER — GADOBENATE DIMEGLUMINE 529 MG/ML IV SOLN
15.0000 mL | Freq: Once | INTRAVENOUS | Status: AC | PRN
  Administered 2021-05-21: 15 mL via INTRAVENOUS

## 2021-05-23 ENCOUNTER — Encounter: Payer: Self-pay | Admitting: Neurology

## 2021-05-23 DIAGNOSIS — R251 Tremor, unspecified: Secondary | ICD-10-CM

## 2021-05-23 DIAGNOSIS — G35 Multiple sclerosis: Secondary | ICD-10-CM

## 2021-05-23 DIAGNOSIS — R42 Dizziness and giddiness: Secondary | ICD-10-CM

## 2021-05-23 DIAGNOSIS — E0840 Diabetes mellitus due to underlying condition with diabetic neuropathy, unspecified: Secondary | ICD-10-CM

## 2021-05-23 DIAGNOSIS — G4719 Other hypersomnia: Secondary | ICD-10-CM

## 2021-05-23 NOTE — Progress Notes (Signed)
This was a STAT MRI ordered and performed on Wednesday, to be read by Dr Epimenio Foot and for PML rule out.   No signs /manifestations of PML-  we could continue current MS therapy or change to [facility]- oral course of 2 years. Your MS course has been mild, not aggressive -and as such has not caused many demyelinating lesions or  black holes.  Overall small vessel disease progression is related to vascular risk factors, not MS- such as Diabetes, HTN , migraine.  NO BRAIN ATROPHY. 2 long standing lesions in the cervical cord.   My next step is to send you for cognitive testing - and we need to consider a stimulant to get your energy level up, check on possible low oxygen at night, HST is ordered since 05-01-2021- risk factor management with Dr Valentina Lucks. .  I can take you out of work for another 3 weeks.

## 2021-05-25 NOTE — Procedures (Signed)
Clinical history: This 55 year old patient has a history of mild relapsing remitting MS mainly manifesting in cervical spinal cord lesions.  She reported a decrease in energy, periods of weakness and trembling, blurred vision and excessive sleepiness.  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  This EEG was performed with a running time of 29 minutes and 28 seconds on 19 May 2021.  Technical data the EEG is performed under the international 10-20 placement system of electrode, electric activity was acquired with a low and high-frequency filter in place, a single channel EKG electrode accompanies this tracing.  There is no video recording.  A posterior dominant rhythm was established at 9 Hz during periods of eye closure and promptly attenuated with eye opening.  The overall amplitude is a low to medium 1 at about 25 V.  The response to eye opening and eye closure is symmetric.   Hyperventilation was not performed showing minimal amplitude buildup some motion artifact occurred this is also manifested in both cerebral hemispheres at the same time there is no epileptiform discharge seen, there was a period of facial twitching reported which did not manifest any EEG changes.   After 2 minutes of rest the next maneuver was photic stimulation.  Here there was no entrainment noted.  The EKG shows a 60 bpm regular sinus rhythm.    Following photic stimulation there is a brief period of drowsiness but no sleep was noted.  Conclusion this is a normal EEG,EKG.

## 2021-05-25 NOTE — Progress Notes (Signed)
After 2 minutes of rest the next maneuver followed was a photic  ( strobe light)  stimulation.  There was no photic entrainment noted.  The EKG showed a 60 bpm regular sinus rhythm.    Following photic stimulation, there is a brief period of drowsiness but no sleep was noted.  Conclusion this is a normal EEG,EKG.

## 2021-05-26 ENCOUNTER — Telehealth: Payer: Self-pay

## 2021-05-26 NOTE — Telephone Encounter (Signed)
LVM for pt to call me back to schedule sleep study  

## 2021-05-28 ENCOUNTER — Other Ambulatory Visit (HOSPITAL_COMMUNITY): Payer: Self-pay

## 2021-05-29 ENCOUNTER — Other Ambulatory Visit (HOSPITAL_COMMUNITY): Payer: Self-pay

## 2021-05-29 ENCOUNTER — Telehealth: Payer: Self-pay | Admitting: Neurology

## 2021-05-29 NOTE — Telephone Encounter (Signed)
Patient came in lobby to pay Disability fee $50.00 - messaged debra on teams

## 2021-06-03 DIAGNOSIS — Z0289 Encounter for other administrative examinations: Secondary | ICD-10-CM

## 2021-06-03 NOTE — Progress Notes (Signed)
I agree. Thank you, Dr Epimenio Foot.

## 2021-06-05 ENCOUNTER — Telehealth: Payer: Self-pay | Admitting: *Deleted

## 2021-06-05 NOTE — Telephone Encounter (Signed)
[employer] form faxed on 06/05/21

## 2021-06-10 ENCOUNTER — Encounter: Payer: Self-pay | Admitting: Psychology

## 2021-06-10 ENCOUNTER — Telehealth: Payer: Self-pay | Admitting: Neurology

## 2021-06-10 ENCOUNTER — Encounter: Payer: Self-pay | Admitting: Neurology

## 2021-06-10 NOTE — Telephone Encounter (Signed)
Pt has wrote [place] informing that the time to get in for neuro cognitive testing is booked out until December. I will send to referral team to see if we can try  neuro for cognitive testing.

## 2021-06-11 ENCOUNTER — Other Ambulatory Visit (HOSPITAL_COMMUNITY): Payer: Self-pay

## 2021-06-11 MED FILL — Sertraline HCl Tab 100 MG: ORAL | 90 days supply | Qty: 90 | Fill #0 | Status: AC

## 2021-06-11 MED FILL — Ropinirole Hydrochloride Tab 2 MG: ORAL | 30 days supply | Qty: 60 | Fill #2 | Status: AC

## 2021-06-11 NOTE — Addendum Note (Signed)
Addended by: Geronimo Running A on: 06/11/2021 03:56 PM   Modules accepted: Orders

## 2021-06-12 ENCOUNTER — Telehealth: Payer: Self-pay | Admitting: *Deleted

## 2021-06-12 NOTE — Telephone Encounter (Signed)
Pt matrix form faxed on 06/12/21

## 2021-06-13 ENCOUNTER — Encounter: Payer: Self-pay | Admitting: Neurology

## 2021-06-15 NOTE — Telephone Encounter (Signed)
We were closed by 12 noon on Friday- there should be no cost as you had already paid before. CD

## 2021-06-16 ENCOUNTER — Ambulatory Visit (INDEPENDENT_AMBULATORY_CARE_PROVIDER_SITE_OTHER): Payer: 59 | Admitting: Neurology

## 2021-06-16 DIAGNOSIS — E0840 Diabetes mellitus due to underlying condition with diabetic neuropathy, unspecified: Secondary | ICD-10-CM

## 2021-06-16 DIAGNOSIS — G4710 Hypersomnia, unspecified: Secondary | ICD-10-CM

## 2021-06-16 DIAGNOSIS — G4719 Other hypersomnia: Secondary | ICD-10-CM

## 2021-06-16 DIAGNOSIS — I159 Secondary hypertension, unspecified: Secondary | ICD-10-CM

## 2021-06-16 DIAGNOSIS — G35 Multiple sclerosis: Secondary | ICD-10-CM

## 2021-06-16 DIAGNOSIS — D649 Anemia, unspecified: Secondary | ICD-10-CM

## 2021-06-16 DIAGNOSIS — D849 Immunodeficiency, unspecified: Secondary | ICD-10-CM
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
## 2021-06-18 ENCOUNTER — Telehealth: Payer: Self-pay | Admitting: *Deleted

## 2021-06-18 NOTE — Telephone Encounter (Signed)
Pt hartford form faxed on 06/18/21.

## 2021-06-24 ENCOUNTER — Other Ambulatory Visit (HOSPITAL_COMMUNITY): Payer: Self-pay

## 2021-06-25 ENCOUNTER — Encounter: Payer: 59 | Attending: Psychology | Admitting: Psychology

## 2021-06-25 ENCOUNTER — Other Ambulatory Visit (HOSPITAL_COMMUNITY): Payer: Self-pay

## 2021-06-25 ENCOUNTER — Other Ambulatory Visit: Payer: Self-pay

## 2021-06-25 DIAGNOSIS — G35 Multiple sclerosis: Secondary | ICD-10-CM

## 2021-06-25 DIAGNOSIS — R413 Other amnesia: Secondary | ICD-10-CM

## 2021-06-27 ENCOUNTER — Other Ambulatory Visit (HOSPITAL_COMMUNITY): Payer: Self-pay

## 2021-06-30 ENCOUNTER — Other Ambulatory Visit (HOSPITAL_COMMUNITY): Payer: Self-pay

## 2021-07-01 ENCOUNTER — Other Ambulatory Visit (HOSPITAL_COMMUNITY): Payer: Self-pay

## 2021-07-01 MED FILL — Betamethasone Valerate Cream 0.1% (Base Equivalent): CUTANEOUS | 7 days supply | Qty: 15 | Fill #1 | Status: AC

## 2021-07-02 DIAGNOSIS — D849 Immunodeficiency, unspecified: Secondary | ICD-10-CM | POA: Insufficient documentation

## 2021-07-02 DIAGNOSIS — G4719 Other hypersomnia: Secondary | ICD-10-CM | POA: Insufficient documentation
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
## 2021-07-02 NOTE — Progress Notes (Signed)
IMPRESSION:  This HST does not confirm the presence of OSA, hypoxia or significant sleep architecture fragmentation.  RECOMMENDATION: Sleep disordered breathing is not cause for this patients severe hypersomnia and sleep attacks.

## 2021-07-03 ENCOUNTER — Encounter: Payer: Self-pay | Admitting: Psychology

## 2021-07-03 ENCOUNTER — Encounter: Payer: 59 | Admitting: Psychology

## 2021-07-03 ENCOUNTER — Other Ambulatory Visit: Payer: Self-pay

## 2021-07-03 ENCOUNTER — Other Ambulatory Visit (HOSPITAL_COMMUNITY): Payer: Self-pay
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
## 2021-07-07 ENCOUNTER — Ambulatory Visit: Payer: 59 | Admitting: Psychology

## 2021-07-15 ENCOUNTER — Encounter: Payer: 59 | Attending: Psychology

## 2021-07-15 ENCOUNTER — Other Ambulatory Visit: Payer: Self-pay

## 2021-07-15 ENCOUNTER — Other Ambulatory Visit (HOSPITAL_COMMUNITY): Payer: Self-pay

## 2021-07-15 DIAGNOSIS — G35 Multiple sclerosis: Secondary | ICD-10-CM | POA: Diagnosis not present

## 2021-07-15 MED FILL — Ropinirole Hydrochloride Tab 2 MG: ORAL | 30 days supply | Qty: 60 | Fill #3 | Status: AC

## 2021-07-15 NOTE — Progress Notes (Signed)
Behavioral Observation  The patient appeared well-groomed and appropriately dressed for the testing session. Her manners were polite and appropriate to the situation. The patient demonstrated a positive attitude toward testing and was cooperative with all testing instructions.   Neuropsychology Note  Erica Powell completed 240 minutes of neuropsychological testing with technician, Marica Otter, BA, under the supervision of Arley Phenix, PsyD., Clinical Neuropsychologist. The patient did not appear overtly distressed by the testing session, per behavioral observation or via self-report to the technician. Rest breaks were offered.   Clinical Decision Making: In considering the patient's current level of functioning, level of presumed impairment, nature of symptoms, emotional and behavioral responses during clinical interview, level of literacy, and observed level of motivation/effort, a battery of tests was selected by Dr. Kieth Brightly during initial consultation on 06/25/2021. This was communicated to the technician. Communication between the neuropsychologist and technician was ongoing throughout the testing session and changes were made as deemed necessary based on patient performance on testing, technician observations and additional pertinent factors such as those listed above.  Tests Administered: Controlled Oral Word Association Test (COWAT; FAS & Animals)  Finger Tapping Test (FTT) Grooved Pegboard Hand Dynamometer  Wechsler Adult Intelligence Scale, 4th Edition (WAIS-IV) Wechsler Memory Scale, 4th Edition (WMS-IV); Adult Battery   Results:  COWAT FAS Total = 53 Z score = 0.741 Animals Total = 21 Z score = -0.166    Finger Tapping Test Right Hand 1 - 24 2 - 25 3 - 27 4 - 26 5 - 31 Left Hand 1 - 25 2 - 26 3 - 29 4 - 34 5 - 34  Hand Dynamometer Right Hand 1 - 28 2 - 30 Left Hand  1 - 27 2 - 28   Grooved Pegboard Right Hand  Time = 1:16.11 # Drops =  2 Left Hand Time = 1:08.05 # Drops = 1   WAIS-IV  Composite Score Summary  Scale Sum of Scaled Scores Composite Score Percentile Rank 95% Conf. Interval Qualitative Description  Verbal Comprehension 29 VCI 98 45 92-104 Average  Perceptual Reasoning 22 PRI 84 14 79-91 Low Average  Working Memory 21 WMI 102 55 95-109 Average  Processing Speed 21 PSI 102 55 93-110 Average  Full Scale 93 FSIQ 95 37 91-99 Average  General Ability 51 GAI 91 27 86-96 Average      Verbal Comprehension Subtests Summary  Subtest Raw Score Scaled Score Percentile Rank Reference Group Scaled Score SEM  Similarities 21 8 25 8  1.04  Vocabulary 36 9 37 10 0.73  Information 18 12 75 13 0.73  (Comprehension) 24 9 37 10 1.16       Perceptual Reasoning Subtests Summary  Subtest Raw Score Scaled Score Percentile Rank Reference Group Scaled Score SEM  Block Design 28 7 16 6  0.95  Matrix Reasoning 14 8 25 7  0.95  Visual Puzzles 10 7 16 7  0.85  (Figure Weights) 13 10 50 8 0.90  (Picture Completion) 8 6 9 6  1.24       Working Raw Score Scaled Score Percentile Rank Reference Group Scaled Score SEM  Digit Span 30 11 63 11 0.73  Arithmetic 15 10 50 11 0.90  (Letter-Number Seq.) 17 8 25 8  1.04       Processing Speed Subtests Summary  Subtest Raw Score Scaled Score Percentile Rank Reference Group Scaled Score SEM  Symbol Search 28 9 37 8 1.56  Coding 74 12 75 10 1.20  (Cancellation)  35 9 37 8 1.62      WMS - Adult Battery  Index Score Summary  Index Sum of Scaled Scores Index Score Percentile Rank 95% Confidence Interval Qualitative Descriptor  Auditory Memory (AMI) 44 105 63 99-111 Average  Visual Memory (VMI) 26 80 9 75-86 Low Average  Visual Working Memory (VWMI) 15 85 16 79-93 Low Average  Immediate Memory (IMI) 34 89 23 83-96 Low Average  Delayed Memory (DMI) 36 93 32 87-100 Average      Primary Subtest Scaled Score Summary  Subtest Domain Raw  Score Scaled Score Percentile Rank  Logical Memory I AM 27 11 63  Logical Memory II AM 25 12 75  Verbal Paired Associates I AM 37 11 63  Verbal Paired Associates II AM 10 10 50  Designs I VM 50 6 9  Designs II VM 44 7 16  Visual Reproduction I VM 27 6 9   Visual Reproduction II VM 13 7 16   Spatial Addition VWM 9 7 16   Symbol Span VWM 19 8 25      Auditory Memory Process Score Summary  Process Score Raw Score Scaled Score Percentile Rank Cumulative Percentage (Base Rate)  LM II Recognition 25 - - 51-75%  VPA II Recognition 39 - - 51-75%      Visual Memory Process Score Summary  Process Score Raw Score Scaled Score Percentile Rank Cumulative Percentage (Base Rate)  DE I Content 31 8 25  -  DE I Spatial 11 5 5  -  DE II Content 32 9 37 -  DE II Spatial 12 10 50 -  DE II Recognition 10 - - 3-9%  VR II Recognition 4 - - 17-25%      ABILITY-MEMORY ANALYSIS  Ability Score:  VCI: 98 Date of Testing:  WAIS-IV; WMS-IV 2021/07/15  Predicted Difference Method   Index Predicted WMS-IV Index Score Actual WMS-IV Index Score Difference Critical Value  Significant Difference Y/N Base Rate  Auditory Memory 99 105 -6 9.43 N   Visual Memory 99 80 19 9.19 Y 5-10%  Visual Working Memory 99 85 14 11.15 Y 10-15%  Immediate Memory 99 89 10 10.27 N   Delayed Memory 99 93 6 10.03 N   Statistical significance (critical value) at the .01 level.      Feedback to Patient: Erica Powell will return on 09/09/2021 for an interactive feedback session with Dr. at which time her test performances, clinical impressions and treatment recommendations will be reviewed in detail. The patient understands she can contact our office should she require our assistance before this time.  240 minutes spent face-to-face with patient administering standardized tests, 30 minutes spent scoring ). [CPT , 96139]  Full report to follow.

## 2021-07-24 ENCOUNTER — Other Ambulatory Visit (HOSPITAL_COMMUNITY): Payer: Self-pay

## 2021-07-28 ENCOUNTER — Other Ambulatory Visit (HOSPITAL_COMMUNITY): Payer: Self-pay

## 2021-07-29 ENCOUNTER — Encounter: Payer: Self-pay | Admitting: Neurology

## 2021-07-29 ENCOUNTER — Other Ambulatory Visit: Payer: Self-pay

## 2021-07-29 ENCOUNTER — Ambulatory Visit: Payer: 59 | Admitting: Neurology

## 2021-07-29 VITALS — BP 128/76 | HR 87 | Ht 65.0 in | Wt 186.0 lb

## 2021-07-29 DIAGNOSIS — G4719 Other hypersomnia: Secondary | ICD-10-CM

## 2021-07-29 DIAGNOSIS — M791 Myalgia, unspecified site: Secondary | ICD-10-CM | POA: Diagnosis not present

## 2021-07-29 DIAGNOSIS — R42 Dizziness and giddiness: Secondary | ICD-10-CM

## 2021-07-29 NOTE — Progress Notes (Signed)
[name]    PATIENT: Erica Powell DOB: 1966-03-04  REASON FOR VISIT: follow up HISTORY FROM: patient here alone.   07-29-2021: I am seeing Erica Powell,  Today is a revisit more than 2 months after we last met but she has undergone he had COVID testing in the meantime.  I repeated a home sleep test with her which showed no apnea.  We repeated an MRI of the brain which showed no acute MS lesions her scan was still abnormal due to by lateral periventricular subcortical and pontine white matter hyperintensities.  These are usually seen with multiple sclerosis but this is not the only disease or process that could cause them.  We looked at a lacunar infarct which has been known from previous MRIs and in comparison to her MRI from January 2022 there has been only a slight progression of white matter disease.  Our concern is that it may be related to diabetes and her A1c she says is very well controlled.  She has remarkably no brain atrophy no migraines, and she is not hypertensive.  Her cervical spine MRI shows the same lesion for many years that was actually a diagnostic for MS.  She has remained on betaseron. The patient's EEG from 6-13 2022 was normal iron studies with her primary care physician had revealed some iron deficiency anemia which is treated, underwent detailed testing the strong low number doctor of psychology and this took place in 2 sessions well the concern was that the patient has had overwhelming weakness, trembling experiences, word finding difficulties problems with focusing but also with fluently speaking.  And he found her thought process coherent her mood more dysphoric but her affect appropriate, the formal testing was performed last week on 8-9- 22.   She had extensive formal testing that day, full report is still to follow. She is supposed to return to work tomorrow for a night shift.   I see her as depressed or frustrated. Caregiver burden.        05-07-2021: URGENT  WID-Rv for this MS patient with recent excessive fatigue, weakness spells, falls ! Presents to follow up with new problems , question of MS exacerbation. She recently had a surgical procedure M(ay 18th, dr Gertie Fey)  and noticed  since then she began to decline. 2 weeks ago she was walking down her steps and slipped on last 3 steps,  bruising her foot. 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 She got off work only to fall asleep in her car in the driveway, overslept by an hour. Couldn't finish her work day,  Her vision has changed, she was unable to keep the car straight in the lane.  Had an episode of slurred speech last Wednesday , a week ago, and her arms felt so heavy "like trembling".  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 The patient underwent an MRI of the brain on January 6 of this year the MRI showed hyperintense foci in the pons and in the hemispheres some of them are radially oriented in the form of Dawson's fingers, the ones in the pons had the appearance more consistent with microvascular changes than the demyelination.  She has a chronic lacune in the left external capsule this is most likely due to the risk factor of hypertension also patients with diabetes and with migraines can develop lacunar infarct lacunar infarctions.  Nothing was acute on this last MRI that was compared to a study from April 2020. Chronic secondary progressive, MS- no longer relapsing remitting.     07-31-2020:The patient is here alone, well established 55 year -old MS patient . She states that overall things are well. She lost her companion in February and considers the Covid vaccine the cause of his death at age 81. She misses him daily and deeply. Has reported that she feels is like bone pain. She feels this on tops of arms and legs, is using the Requip and Ultracet.  She states that her RLS  has been acting up. Last time ferritin level checked  08/16/19 and was 14.6- low. 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   Rv is a hospital pharmacy worker, with a long standing history of MS and RLS.  She takes care of her mother in [place] ( who had a stroke)  and reports recently an increase in RLS and drop in ferritin( 9.9.2020 14.6) . She had a JCV panel in July 2020- negative.  We are discussing starting po iron versus iv- she has taken po iron and has not had a benefit. She reports pounding her legs in pain.     Rv from 06-12-2019, Erica Powell had undergone MRI imaging and we are meeting today to discuss the findings. MRI with slowly progressive demyelination - compared to studies from 2006 on. 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  Ms. Voght has moved with her women's hospital crew to the main [facility], she reports no changes in her overall neurologic health but may be decrease in her short-term memory dysfunction.  1 of her next visits will need to be associated with a Moca test. She has had no falls she can wear high heels, no problems with tandem gait/ heel -walk.  No changes in penmanship.  Reviewed medications there has been no change here she stays on ropinirole at bedtime, takes Betaseron every other day injection 0.3 mg. And iron supplements by mouth.   Patent has seen Erica Powell 3 times since my last visit with her, dated  06/28/2017: Erica Powell is returning today for a six-month revisit she had over the last 6 months taking an increased dose of Requip which has not helped her restless legs. She has some myoclonic jerking supple movements in both lower extremities, but  mostly on the right. It starts as a painful spasm and then releases suddenly. It is very bothersome. The therapy works has not relieved the distress either. She has started dropping things more than usual, she may drop something right out of her hands. She has sometimes double vision, when she gets fatigue her left eye abducts to the center creating a horizontal diplopia. She also states that she has some cognitive problems forgets what she is doing, sometimes she has to talk to her legs to move, the jerks have gotten worse sometimes it banged up against the desk or buckle and she is walking. In the morning however she is stiff as well as when she stands for long periods at a time or sitting for longer time at the computer. She also has urinary problems. She sweats profusely   when she mops, does homework housework physical work it is not like a hot flash. She wonders if it could be a mass. She states that she feels " pretty much like crap all the time not just the normal tiredness".   HISTORY (DOHMEIER): Flannery Handler is a 55 y.o. left handed, caucasian female Is seen here as a routine once a year revisit for multiple sclerosis, originally referred from Dr. Griffin.  Miss Spindle has been a patient in our neurology clinic for the last 8 years, beginning in 2006.   She was diagnosed with multiple sclerosis and treatment was initiated. She had no recent MS relapses but a brain MRI that had been performed in January 2014 was showing still scattered periventricular, subcortical and pontine T2 hyperintensities consistent with chronic demyelinating plaques of various age.   No acute lesions were seen in her last MRI obtained on 12/29/2012. She had 2 separate studies in 2006 at [facility].  Initally she had C5 and C 6 lesions, these dorsal cervical cord lesions did not span multiple levels. The patient was diagnosed in 2012 with diabetes and has a strong DM family history.  Her last HbA1c just 2 month ago was 7.0 and  had been in June 2014 at 8.3.  She noticed an improvement in her urinary frequency once her sugars were lower. She also has still no clinical progression or relapse, but she reports problems with concentration, cognition and sometimes with multitasking. She feels that she is easily distracted and loses her train of thought when this happens. She feels especially distracted by noises at her work. 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 There's been no change in her employment, hours of work , hobbies or social life. She has a bachelor's degree ,is single but lives with her fianc.  She recently noted a new symptom of tightness in her chest , a paraspinal tenderness and tightness, a confined feeling surrounding the chest , a possible variation of " the MS hug", she has responded well to massage, but the relief is not sustained.    She is a [employer] day [employer].       REVIEW OF SYSTEMS: Out of a complete 14 system review of symptoms, the patient complains only of the following symptoms, and all other reviewed systems are negative.  She has a new onset tremble, low amplitude , affecting her ability to button.  Very fatigued,   Collapsed into sleep.  Can't keep concentrating at work. .   ALLERGIES: Allergies  Allergen Reactions   Cephalosporins Itching and Swelling    Occurs with cephalexin (Keflex). Starts with prickling around the face, swelling/numb feeling of face.   Hydrochlorothiazide Itching and Swelling    Swelling of the face, hives, itching, feeling of bad sunburn.    Penicillins Itching and Swelling    Occurs with Augmentin and amoxicillin. Hives, prickling, swelling of face.    Sulfa Antibiotics Itching and Swelling    Itching, hives, swelling of face   Baclofen Other (See Comments)    Leg spasms, jittery, heart racing    Clonazepam     Males me mean   Lorazepam Other (See Comments)    "makes me real short-tempered, mean"    HOME  MEDICATIONS: Outpatient Medications Prior to Visit  Medication Sig Dispense Refill   aspirin 81 MG tablet Take 1 tablet (81 mg total) by mouth daily.     atorvastatin (LIPITOR) 20 MG tablet Take 1 tablet by   mouth daily.  3   betamethasone valerate (VALISONE) 0.1 % cream APPLY A THIN LAYER TO THE AFFECTED AREA ONCE A DAY 15 g 3   BETASERON 0.3 MG KIT injection INJECT 0.3 MG INTO THE SKIN EVERY OTHER DAY. 14 kit 11   cholecalciferol (VITAMIN D) 1000 UNITS tablet Take 1,000 Units by mouth daily.     Dulaglutide (TRULICITY) 3 MG/0.5ML SOPN Inject 3 mg into the skin once a week. 2 mL 11   ferrous gluconate (FERGON) 324 MG tablet Take 324 mg by mouth daily with breakfast.     fluocinonide cream (LIDEX) 0.05 % Apply 1 application topically to affected area 2 (two) times daily. 30 g 1   glucose blood test strip USE 1 TO CHECK BLOOD GLUCOSE 1 TO 2 TIMES DAILY. (Patient taking differently: USE 1 TO CHECK BLOOD GLUCOSE 1 TO 2 TIMES DAILY.) 200 strip 3   hydrOXYzine (ATARAX/VISTARIL) 10 MG tablet Take 1 tablet (10 mg total) by mouth 3 (three) times daily as needed. 90 tablet 1   Lancets (FREESTYLE) lancets USE 1 TO CHECK BLOOD GLUCOSE 1 TO 2 TIMES DAILY AS DIRECTED. (Patient taking differently: USE 1 TO CHECK BLOOD GLUCOSE 1 TO 2 TIMES DAILY AS DIRECTED.) 200 each 3   lisinopril (PRINIVIL,ZESTRIL) 10 MG tablet Take 1 tablet (10 mg total) by mouth daily.     metFORMIN (GLUCOPHAGE) 1000 MG tablet TAKE 1 TABLET BY MOUTH 2 TIMES DAILY WITH A MEAL. 180 tablet 3   predniSONE (DELTASONE) 20 MG tablet Take 1 tablet today, tomorrow (05/21/21) and additional tablet the following day if needed. (For urgent MRI prep) 3 tablet 0   rOPINIRole (REQUIP) 2 MG tablet TAKE 1-2 TABLETS (2-4 MG TOTAL) BY MOUTH AT BEDTIME. 60 tablet 5   sertraline (ZOLOFT) 100 MG tablet TAKE 1 TABLET BY MOUTH ONCE A DAY 90 tablet 3   traMADol-acetaminophen (ULTRACET) 37.5-325 MG tablet Take 1 tablet by mouth 2 (two) times daily as needed. 60 tablet  5   vitamin B-12 (CYANOCOBALAMIN) 1000 MCG tablet Take 1,000 mcg by mouth daily.     lisinopril (ZESTRIL) 10 MG tablet Take 1 tablet (10 mg total) by mouth daily. 90 tablet 3   No facility-administered medications prior to visit.    PAST MEDICAL HISTORY: Past Medical History:  Diagnosis Date   Anemia    Diabetes mellitus    Hyperlipemia    Hypertension    Multiple sclerosis (HCC)    RLS (restless legs syndrome)    Type 2 diabetes mellitus without (mention of) complications     PAST SURGICAL HISTORY: Past Surgical History:  Procedure Laterality Date   BONE CYST EXCISION     ESSURE TUBAL LIGATION     WISDOM TOOTH EXTRACTION  1985    FAMILY HISTORY: Family History  Problem Relation Age of Onset   Stroke Mother    Lupus Sister    Cancer Maternal Uncle        bone cancer    SOCIAL HISTORY: Social History   Socioeconomic History   Marital status: Single    Spouse name: Not on file   Number of children: 0   Years of education: Bachelor's   Highest education level: Not on file  Occupational History   Occupation: Pharamcy Tech    Employer: [employer]   Tobacco Use   Smoking status: Never   Smokeless tobacco: Never  Substance and Sexual Activity   Alcohol use: No   Drug use: No   Sexual activity: Not   on file  Other Topics Concern   Not on file  Social History Narrative   Patient is single with no children   Patient is left handed   Patient has a Bachelor's degree   Patient drinks 20 oz daily   Social Determinants of Health   Financial Resource Strain: Not on file  Food Insecurity: Not on file  Transportation Needs: Not on file  Physical Activity: Not on file  Stress: Not on file  Social Connections: Not on file  Intimate Partner Violence: Not on file      PHYSICAL EXAM  Vitals:   07/29/21 1320  BP: 128/76  Pulse: 87  Weight: 186 lb (84.4 kg)  Height: 5' 5" (1.651 m)   Body mass index is 30.95 kg/m.   Ankle edema, pale skin, low  turgor.   Generalized: Well developed, in some distress - psychological,  grieving for her late companion.    Neurological examination  Mentation: Alert oriented to time, place, history taking. Follows all commands speech and language fluent  Cranial nerve ,: no change in taste or smell.  Pupils were equal round reactive to light.  Extraocular movements were not full - she has left lazy eye, adducting , causing diplopia when fatigued.  Facial sensation and strength were normal.  Uvula and tongue midline.  Head turning and shoulder shrug  were normal and symmetric. Motor: symmetric motor tone, normal ROM, no rigor.   Sensory: intact to soft touch, vibration and temperature on all 4 extremities.   Coordination: no tremor, no ataxia here.   Gait and station:  stabile unassisted gait and 3 point turns,  well balanced,     Reflexes: Deep tendon reflexes are symmetrically-elevated , bilaterally- no clonus. .  DIAGNOSTIC DATA (LABS, IMAGING, TESTING) - I reviewed patient records, labs, notes, testing and imaging myself where available.MRI brain, EEG, HST      ASSESSMENT AND PLAN; 07-29-2021 55 y.o. year old caucasian female with a long standing MS, relapsing- remitting-  history, RLS, and new excessive fatigue, sleep attacks, vision changes, lightheadedness and malaise. Improved all above symptoms, so I think she was under too much stress.  She had FMLA, can return to work tomorrow.  She is going to switch weeks at work, to a different supervisor.   Anger management - consider counseling.    MRI was unrevealing. Normal EEG, normal HST,  Remains on Betaseron , she was thus far  not interested in tysabri or MAB therapy.  Covid vaccinated in MAY, June- 21 and  Booster.    Carmen Dohmeier, MD  07/29/2021, 1:56 PM [facility] [address], [address] [place], [place] [address] [phone]    

## 2021-07-29 NOTE — Patient Instructions (Signed)
[email or web address]   Caregiver Guide Dementia is a term used to describe a number of symptoms that affect memory and thinking. The most common symptoms include: Memory loss. Trouble with language and communication. Trouble concentrating. Poor judgment and problems with reasoning. Wandering from home or public places. Extreme anxiety or depression. Being suspicious or having angry outbursts and accusations. Child-like behavior and language. Dementia can be frightening and confusing. And taking care of someone with dementia can be challenging. This guide provides tips to help you whenproviding care for a person with dementia. How to help manage lifestyle changes Dementia usually gets worse slowly over time. In the early stages, people with dementia can stay independent and safe with some help. In later stages, they need help with daily tasks such as dressing, grooming, and using the bathroom. There are actions you can take to help a person manage his or her life whileliving with this condition. Communicating When the person is talking or seems frustrated, make eye contact and hold the person's hand. Ask specific questions that need yes or no answers. Use simple words, short sentences, and a calm voice. Only give one direction at a time. When offering choices, limit the person to just one or two. Avoid correcting the person in a negative way. If the person is struggling to find the right words, gently try to help him or her. Preventing injury  Keep floors clear of clutter. Remove rugs, magazine racks, and floor lamps. Keep hallways well lit, especially at night. Put a handrail and nonslip mat in the bathtub or shower. Put childproof locks on cabinets that contain dangerous items, such as medicines, alcohol, guns, toxic cleaning items, sharp tools or utensils, matches, and lighters. For doors to the outside of the house, put the locks in places where  the person cannot see or reach them easily. This will help ensure that the person does not wander out of the house and get lost. Be prepared for emergencies. Keep a list of emergency phone numbers and addresses in a convenient area. Remove car keys and lock garage doors so that the person does not try to get in the car and drive. Have the person wear a bracelet that tracks locations and identifies the person as having memory problems. This should be worn at all times for safety.  Helping with daily life  Keep the person on track with his or her routine. Try to identify areas where the person may need help. Be supportive, patient, calm, and encouraging. Gently remind the person that adjusting to changes takes time. Help with the tasks that the person has asked for help with. Keep the person involved in daily tasks and decisions as much as possible. Encourage conversation, but try not to get frustrated if the person struggles to find words or does not seem to appreciate your help.  How to recognize stress Look for signs of stress in yourself and in the person you are caring for. If you notice signs of stress, take steps to manage it. Symptoms of stress include: Feeling anxious, irritable, frustrated, or angry. Denying that the person has dementia or that his or her symptoms will not improve. Feeling depressed, hopeless, or unappreciated. Difficulty sleeping. Difficulty concentrating. Developing stress-related health problems. Feeling like you have too little time for your own life. Follow these instructions at home: Take care of your health Make sure that you and the person you are caring for: Get regular sleep. Exercise regularly. Eat regular, nutritious meals. Take over-the-counter and prescription   medicines only as told by your health care providers. Drink enough fluid to keep your urine pale yellow. Attend all scheduled health care appointments.  General instructions Join a  support group with others who are caregivers. Ask about respite care resources. Respite care can provide short-term care for the person so that you can have a regular break from the stress of caregiving. Consider any safety risks and take steps to avoid them. Organize medicines in a pill box for each day of the week. Create a plan to handle any legal or financial matters. Get legal or financial advice if needed. Keep a calendar in a central location to remind the person of appointments or other activities. Where to find support: Many individuals and organizations offer support. These include: Support groups for people with dementia. Support groups for caregivers. Counselors or therapists. Home health care services. Adult day care centers. Where to find more information Centers for Disease Control and Prevention: www.cdc.gov Alzheimer's Association: www.alz.org Family Caregiver Alliance: www.caregiver.org Alzheimer's Foundation of America: www.alzfdn.org Contact a health care provider if: The person's health is rapidly getting worse. You are no longer able to care for the person. Caring for the person is affecting your physical and emotional health. You are feeling depressed or anxious about caring for the person. Get help right away if: The person threatens himself or herself, you, or anyone else. You feel depressed or sad, or feel that you want to harm yourself. If you ever feel like your loved one may hurt himself or herself or others, or if he or she shares thoughts about taking his or her own life, get help right away. You can go to your nearest emergency department or: Call your local emergency services (911 in the [place] Call a suicide crisis helpline, such as the National Suicide Prevention Lifeline at 1-800-273-8255. This is open 24 hours a day in the [place] Text the Crisis Text Line at 741741 (in the [place] Summary Dementia is a term used to describe a number of symptoms that  affect memory and thinking. Dementia usually gets worse slowly over time. Take steps to reduce the person's risk of injury and to plan for future care. Caregivers need support, relief from caregiving, and time for their own lives. This information is not intended to replace advice given to you by your health care provider. Make sure you discuss any questions you have with your healthcare provider. Document Revised: 04/08/2020 Document Reviewed: 04/08/2020 Elsevier Patient Education  2022 Elsevier Inc.  

## 2021-08-01 ENCOUNTER — Other Ambulatory Visit (HOSPITAL_COMMUNITY): Payer: Self-pay

## 2021-08-04 ENCOUNTER — Other Ambulatory Visit (HOSPITAL_COMMUNITY): Payer: Self-pay

## 2021-08-05 ENCOUNTER — Other Ambulatory Visit (HOSPITAL_COMMUNITY): Payer: Self-pay

## 2021-08-05 DIAGNOSIS — H35033 Hypertensive retinopathy, bilateral: Secondary | ICD-10-CM | POA: Diagnosis not present

## 2021-08-05 DIAGNOSIS — H40023 Open angle with borderline findings, high risk, bilateral: Secondary | ICD-10-CM | POA: Diagnosis not present

## 2021-08-05 DIAGNOSIS — E113213 Type 2 diabetes mellitus with mild nonproliferative diabetic retinopathy with macular edema, bilateral: Secondary | ICD-10-CM | POA: Diagnosis not present

## 2021-08-05 DIAGNOSIS — H43811 Vitreous degeneration, right eye: Secondary | ICD-10-CM | POA: Diagnosis not present

## 2021-08-05 MED ORDER — ATORVASTATIN CALCIUM 20 MG PO TABS
20.0000 mg | ORAL_TABLET | Freq: Every day | ORAL | 3 refills | Status: DC
  Filled 2021-08-05: qty 90, 90d supply, fill #0
  Filled 2021-11-03: qty 90, 90d supply, fill #1
  Filled 2022-01-26: qty 90, 90d supply, fill #2
  Filled 2022-05-14: qty 90, 90d supply, fill #3

## 2021-08-07 ENCOUNTER — Telehealth: Payer: Self-pay | Admitting: Neurology

## 2021-08-07 NOTE — Telephone Encounter (Signed)
  Dear Dr. Valentina Lucks, I like to forward you Dr Elyn Aquas note regarding our mutual patient:   We have her scheduled for me to write this up soon and I will try to get that moved up sooner.  Quick look at testing do show more difficulties for right hemispheric brain functions versus left hemispheric brain dysfunctions.  However, there were no significant deficits other than visual memory deficits noted in general.  The patient's expressive language functions were within normal limits or actually better than normative expectations.  The patient shows some difficulties with visual-spatial and visual processing information and visual attention and visual memory are impaired.  She does have significant visual impairments.  However, these visual impairments including processing and memory showed significant improvement on memory testing under cued/recognition format suggesting more white matter involvement in these difficulties then cortical involvement.  The patient's subjective symptoms and difficulties were much more significant and severe than her actual performance on neuropsychological test measures.  There are abnormalities that are right greater than left hemispheric deficits and the symptoms appear to be more primarily subcortical than cortical in nature.  I will see if I can get the patient's schedule for writing up this report done as soon as possible.  I apologize for the delay but we have lost the other neuropsychologist here in our office and while we are working on hiring a new one it is really put a delay on getting everything done.   Arley Phenix

## 2021-08-13 ENCOUNTER — Other Ambulatory Visit (HOSPITAL_COMMUNITY): Payer: Self-pay

## 2021-08-15 ENCOUNTER — Other Ambulatory Visit: Payer: Self-pay | Admitting: Neurology

## 2021-08-19 ENCOUNTER — Other Ambulatory Visit (HOSPITAL_COMMUNITY): Payer: Self-pay

## 2021-08-19 ENCOUNTER — Other Ambulatory Visit: Payer: Self-pay | Admitting: Neurology

## 2021-08-20 ENCOUNTER — Encounter: Payer: 59 | Attending: Psychology | Admitting: Psychology

## 2021-08-20 ENCOUNTER — Other Ambulatory Visit (HOSPITAL_COMMUNITY): Payer: Self-pay

## 2021-08-20 ENCOUNTER — Other Ambulatory Visit: Payer: Self-pay

## 2021-08-20 DIAGNOSIS — R413 Other amnesia: Secondary | ICD-10-CM | POA: Insufficient documentation

## 2021-08-20 DIAGNOSIS — G35 Multiple sclerosis: Secondary | ICD-10-CM | POA: Diagnosis not present

## 2021-08-20 DIAGNOSIS — G4719 Other hypersomnia: Secondary | ICD-10-CM | POA: Insufficient documentation

## 2021-08-20 DIAGNOSIS — F418 Other specified anxiety disorders: Secondary | ICD-10-CM | POA: Diagnosis not present

## 2021-08-20 DIAGNOSIS — R251 Tremor, unspecified: Secondary | ICD-10-CM | POA: Diagnosis not present

## 2021-08-20 MED ORDER — ROPINIROLE HCL 2 MG PO TABS
2.0000 mg | ORAL_TABLET | Freq: Every day | ORAL | 5 refills | Status: DC
  Filled 2021-08-20: qty 60, 30d supply, fill #0
  Filled 2021-09-30: qty 60, 30d supply, fill #1
  Filled 2021-11-03: qty 60, 30d supply, fill #2
  Filled 2021-12-01: qty 60, 30d supply, fill #3
  Filled 2022-01-01: qty 60, 30d supply, fill #4
  Filled 2022-01-26: qty 60, 30d supply, fill #5

## 2021-08-21 ENCOUNTER — Other Ambulatory Visit (HOSPITAL_COMMUNITY): Payer: Self-pay

## 2021-08-21 MED ORDER — METFORMIN HCL 1000 MG PO TABS
1000.0000 mg | ORAL_TABLET | Freq: Two times a day (BID) | ORAL | 3 refills | Status: DC
  Filled 2021-08-21: qty 180, 90d supply, fill #0
  Filled 2021-12-01: qty 180, 90d supply, fill #1
  Filled 2022-03-01: qty 180, 90d supply, fill #2
  Filled 2022-06-08: qty 180, 90d supply, fill #3

## 2021-08-25 DIAGNOSIS — E1121 Type 2 diabetes mellitus with diabetic nephropathy: Secondary | ICD-10-CM | POA: Diagnosis not present

## 2021-08-25 DIAGNOSIS — N182 Chronic kidney disease, stage 2 (mild): Secondary | ICD-10-CM | POA: Diagnosis not present

## 2021-08-25 DIAGNOSIS — I10 Essential (primary) hypertension: Secondary | ICD-10-CM | POA: Diagnosis not present

## 2021-08-25 DIAGNOSIS — E113393 Type 2 diabetes mellitus with moderate nonproliferative diabetic retinopathy without macular edema, bilateral: Secondary | ICD-10-CM | POA: Diagnosis not present

## 2021-08-25 DIAGNOSIS — R4189 Other symptoms and signs involving cognitive functions and awareness: Secondary | ICD-10-CM | POA: Diagnosis not present

## 2021-08-27 ENCOUNTER — Other Ambulatory Visit (HOSPITAL_COMMUNITY): Payer: Self-pay

## 2021-08-28 ENCOUNTER — Other Ambulatory Visit (HOSPITAL_COMMUNITY): Payer: Self-pay

## 2021-09-09 ENCOUNTER — Encounter: Payer: 59 | Attending: Psychology | Admitting: Psychology

## 2021-09-09 ENCOUNTER — Other Ambulatory Visit (HOSPITAL_COMMUNITY): Payer: Self-pay

## 2021-09-09 ENCOUNTER — Encounter: Payer: Self-pay | Admitting: Psychology

## 2021-09-09 ENCOUNTER — Other Ambulatory Visit: Payer: Self-pay

## 2021-09-09 DIAGNOSIS — F418 Other specified anxiety disorders: Secondary | ICD-10-CM | POA: Diagnosis not present

## 2021-09-09 DIAGNOSIS — R251 Tremor, unspecified: Secondary | ICD-10-CM | POA: Insufficient documentation

## 2021-09-09 DIAGNOSIS — G4719 Other hypersomnia: Secondary | ICD-10-CM | POA: Insufficient documentation

## 2021-09-09 DIAGNOSIS — G35 Multiple sclerosis: Secondary | ICD-10-CM | POA: Diagnosis not present

## 2021-09-09 DIAGNOSIS — R413 Other amnesia: Secondary | ICD-10-CM | POA: Diagnosis not present

## 2021-09-09 MED FILL — Sertraline HCl Tab 100 MG: ORAL | 90 days supply | Qty: 90 | Fill #1 | Status: AC
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
## 2021-09-11 ENCOUNTER — Encounter: Payer: Self-pay | Admitting: Psychology

## 2021-09-11 NOTE — Progress Notes (Signed)
09/09/2021: 3 PM-4 PM today's visit was an in person visit was conducted in my outpatient clinic office with the patient myself present.  We reviewed the results of the recent neuropsychological evaluation that can be found in its entirety in the patient's EMR dated 08/20/2021.  I have included the impression/diagnostic section of this report below for convenience.  We went over the results and talked in depth around strategies to help minimize some of the impacts of her difficulties having including better management of her mood including anxiety and depressive symptomatology.  Impression/Diagnosis:                     Overall, the results of the objective neuropsychological assessment generally so some very specific areas of cognitive deficits with the majority of areas well-preserved.  The patient is showing some bilateral motor deficits related to fine motor control while grip strength and motor speed are generally well-preserved.  The patient is not showing any significant change in expressive language and verbal fluency and the patient's experience of slurred speech are likely related to motor function rather than cortical abnormalities and left parietal or left frontal regions.  There are some clear lateralizing findings with regard to changes in visual-spatial and visual analysis capacity, visual encoding deficits versus well preserved auditory encoding functions, as well as significant changes in visual memory and learning with well-preserved auditory memory and learning.  These deficits would suggest greater right hemisphere versus left hemisphere functioning and would be consistent with both subcortical involvement as well as cortical involvement but potentially right hemisphere greater than left hemisphere involvement.  However, given the fact that the patient is left-handed there is some possibility that hemispheric localization of deficits are potentially altered although the majority of left-handed  individuals with still process visual information primarily in the right hemisphere.  As far as diagnostic interpretations, the patient's subjective reports of expressive language functions likely are more related to changes in motor functions producing slurred speech.  There does appear to be some lateralizing findings which would strongly suggest an underlying neurological etiological finding consistent with changes due to MS lesioning and/or vascular changes in subcortical brain regions in the pons and underlying white matter regions.  There appear to be greater right hemisphere abnormalities than left hemispheric abnormalities around clear differences between auditory versus visual memory functions with visual processing of information, visual encoding, visual memory deficits related to storage and organization all clearly indicated.  Executive functioning, general fund of knowledge, and overall speed of mental operations were all within normal limits.  The latter finding would suggest that there are more localized and focal white matter changes versus widespread microvascular ischemic changes and/or small vessel disease.  I will sit down with the patient and go over the results of the current neuropsychological evaluation on 09/09/2021.  The patient is clearly having some impact on her mood including anxiety and depression associated with her changing cognitive functioning and reduction in work capacity and concern for how these will impact her life going forward.  Sustained sleep difficulties potentially related to restless leg syndrome and/or motor changes impacting quality of sleep are all noted.  There were no indications of any obstructive sleep apnea noted on sleep studies but her restless leg syndrome and depression and anxiety are still likely having a deleterious effect on her.  We will address ongoing treatment options along with her ongoing neurological care.  Diagnosis:                                Multiple sclerosis, relapsing-remitting (HCC)   Memory loss   Excessive daytime sleepiness   Episodes of trembling   Depression with anxiety     _____________________ Arley Phenix, Psy.D. Clinical Neuropsychologist

## 2021-09-16 ENCOUNTER — Encounter: Payer: Self-pay | Admitting: Neurology

## 2021-09-18 ENCOUNTER — Other Ambulatory Visit (HOSPITAL_COMMUNITY): Payer: Self-pay

## 2021-09-23 ENCOUNTER — Other Ambulatory Visit (HOSPITAL_COMMUNITY): Payer: Self-pay

## 2021-09-26 ENCOUNTER — Other Ambulatory Visit (HOSPITAL_COMMUNITY): Payer: Self-pay

## 2021-09-30 ENCOUNTER — Other Ambulatory Visit (HOSPITAL_COMMUNITY): Payer: Self-pay

## 2021-10-17 ENCOUNTER — Other Ambulatory Visit (HOSPITAL_COMMUNITY): Payer: Self-pay

## 2021-10-20 ENCOUNTER — Other Ambulatory Visit (HOSPITAL_COMMUNITY): Payer: Self-pay

## 2021-10-20 DIAGNOSIS — R87612 Low grade squamous intraepithelial lesion on cytologic smear of cervix (LGSIL): Secondary | ICD-10-CM | POA: Diagnosis not present

## 2021-10-20 DIAGNOSIS — N871 Moderate cervical dysplasia: Secondary | ICD-10-CM | POA: Diagnosis not present

## 2021-10-21 ENCOUNTER — Other Ambulatory Visit: Payer: Self-pay | Admitting: Neurology

## 2021-10-22 ENCOUNTER — Other Ambulatory Visit (HOSPITAL_COMMUNITY): Payer: Self-pay

## 2021-10-22 MED ORDER — TRAMADOL-ACETAMINOPHEN 37.5-325 MG PO TABS
1.0000 | ORAL_TABLET | Freq: Two times a day (BID) | ORAL | 5 refills | Status: DC | PRN
  Filled 2021-10-22: qty 60, 30d supply, fill #0
  Filled 2021-12-01: qty 60, 30d supply, fill #1
  Filled 2022-01-01: qty 60, 30d supply, fill #2
  Filled 2022-01-26 – 2022-01-29 (×2): qty 60, 30d supply, fill #3
  Filled 2022-03-01: qty 60, 30d supply, fill #4
  Filled 2022-03-29: qty 60, 30d supply, fill #5

## 2021-10-22 NOTE — Telephone Encounter (Signed)
Received refill request for Ultracet.  Last OV was on 07/29/21.  Next OV is scheduled for 01/27/22 .  Last RX was written on 09/09/21 for 60 tabs.   [facility] has been reviewed.

## 2021-11-03 ENCOUNTER — Other Ambulatory Visit (HOSPITAL_COMMUNITY): Payer: Self-pay

## 2021-11-03 MED ORDER — LISINOPRIL 10 MG PO TABS
10.0000 mg | ORAL_TABLET | Freq: Every day | ORAL | 3 refills | Status: DC
  Filled 2021-11-03: qty 90, 90d supply, fill #0
  Filled 2022-01-26: qty 90, 90d supply, fill #1
  Filled 2022-05-14: qty 90, 90d supply, fill #2
  Filled 2022-08-14: qty 30, 30d supply, fill #3
  Filled 2022-09-25: qty 30, 30d supply, fill #4

## 2021-11-06 ENCOUNTER — Ambulatory Visit: Payer: 59 | Admitting: Psychology

## 2021-11-13 ENCOUNTER — Other Ambulatory Visit (HOSPITAL_COMMUNITY): Payer: Self-pay

## 2021-11-18 ENCOUNTER — Ambulatory Visit: Payer: 59 | Admitting: Psychology

## 2021-11-24 ENCOUNTER — Other Ambulatory Visit (HOSPITAL_COMMUNITY): Payer: Self-pay

## 2021-11-25 ENCOUNTER — Ambulatory Visit: Payer: 59 | Admitting: Psychology

## 2021-11-26 ENCOUNTER — Ambulatory Visit: Payer: 59 | Admitting: Psychology

## 2021-11-27 ENCOUNTER — Encounter: Payer: Self-pay | Admitting: Family Medicine

## 2021-12-01 ENCOUNTER — Other Ambulatory Visit (HOSPITAL_COMMUNITY): Payer: Self-pay

## 2021-12-01 MED FILL — Sertraline HCl Tab 100 MG: ORAL | 90 days supply | Qty: 90 | Fill #2 | Status: AC

## 2021-12-02 ENCOUNTER — Other Ambulatory Visit (HOSPITAL_COMMUNITY): Payer: Self-pay

## 2021-12-02 MED ORDER — BETAMETHASONE VALERATE 0.1 % EX CREA
1.0000 "application " | TOPICAL_CREAM | Freq: Every day | CUTANEOUS | 0 refills | Status: DC
  Filled 2021-12-02: qty 15, 7d supply, fill #0

## 2021-12-16 ENCOUNTER — Other Ambulatory Visit (HOSPITAL_COMMUNITY): Payer: Self-pay

## 2021-12-23 DIAGNOSIS — G35 Multiple sclerosis: Secondary | ICD-10-CM | POA: Diagnosis not present

## 2021-12-23 DIAGNOSIS — N182 Chronic kidney disease, stage 2 (mild): Secondary | ICD-10-CM | POA: Diagnosis not present

## 2021-12-23 DIAGNOSIS — G2581 Restless legs syndrome: Secondary | ICD-10-CM | POA: Diagnosis not present

## 2021-12-23 DIAGNOSIS — Z23 Encounter for immunization: Secondary | ICD-10-CM | POA: Diagnosis not present

## 2021-12-23 DIAGNOSIS — Z0000 Encounter for general adult medical examination without abnormal findings: Secondary | ICD-10-CM | POA: Diagnosis not present

## 2021-12-23 DIAGNOSIS — E1121 Type 2 diabetes mellitus with diabetic nephropathy: Secondary | ICD-10-CM | POA: Diagnosis not present

## 2021-12-23 DIAGNOSIS — E7800 Pure hypercholesterolemia, unspecified: Secondary | ICD-10-CM | POA: Diagnosis not present

## 2021-12-23 DIAGNOSIS — E113393 Type 2 diabetes mellitus with moderate nonproliferative diabetic retinopathy without macular edema, bilateral: Secondary | ICD-10-CM | POA: Diagnosis not present

## 2021-12-23 DIAGNOSIS — I10 Essential (primary) hypertension: Secondary | ICD-10-CM | POA: Diagnosis not present

## 2021-12-24 ENCOUNTER — Other Ambulatory Visit (HOSPITAL_COMMUNITY): Payer: Self-pay

## 2021-12-24 MED ORDER — TRULICITY 4.5 MG/0.5ML ~~LOC~~ SOAJ
4.5000 mg | SUBCUTANEOUS | 1 refills | Status: DC
  Filled 2021-12-24: qty 2, 28d supply, fill #0
  Filled 2022-01-26: qty 2, 28d supply, fill #1

## 2021-12-25 ENCOUNTER — Other Ambulatory Visit (HOSPITAL_COMMUNITY): Payer: Self-pay

## 2021-12-26 ENCOUNTER — Other Ambulatory Visit (HOSPITAL_COMMUNITY): Payer: Self-pay

## 2022-01-01 ENCOUNTER — Other Ambulatory Visit (HOSPITAL_COMMUNITY): Payer: Self-pay

## 2022-01-02 DIAGNOSIS — Z1211 Encounter for screening for malignant neoplasm of colon: Secondary | ICD-10-CM | POA: Diagnosis not present

## 2022-01-03 ENCOUNTER — Encounter: Payer: Self-pay | Admitting: Neurology

## 2022-01-06 DIAGNOSIS — H3562 Retinal hemorrhage, left eye: Secondary | ICD-10-CM | POA: Diagnosis not present

## 2022-01-06 DIAGNOSIS — H35033 Hypertensive retinopathy, bilateral: Secondary | ICD-10-CM | POA: Diagnosis not present

## 2022-01-06 DIAGNOSIS — H43811 Vitreous degeneration, right eye: Secondary | ICD-10-CM | POA: Diagnosis not present

## 2022-01-06 DIAGNOSIS — H3582 Retinal ischemia: Secondary | ICD-10-CM | POA: Diagnosis not present

## 2022-01-06 DIAGNOSIS — E113211 Type 2 diabetes mellitus with mild nonproliferative diabetic retinopathy with macular edema, right eye: Secondary | ICD-10-CM | POA: Diagnosis not present

## 2022-01-08 ENCOUNTER — Other Ambulatory Visit (HOSPITAL_COMMUNITY): Payer: Self-pay

## 2022-01-13 ENCOUNTER — Other Ambulatory Visit (HOSPITAL_COMMUNITY): Payer: Self-pay

## 2022-01-22 ENCOUNTER — Other Ambulatory Visit (HOSPITAL_COMMUNITY): Payer: Self-pay

## 2022-01-26 ENCOUNTER — Other Ambulatory Visit (HOSPITAL_COMMUNITY): Payer: Self-pay

## 2022-01-27 ENCOUNTER — Encounter: Payer: Self-pay | Admitting: Family Medicine

## 2022-01-27 ENCOUNTER — Ambulatory Visit: Payer: 59 | Admitting: Family Medicine

## 2022-01-27 ENCOUNTER — Other Ambulatory Visit (HOSPITAL_COMMUNITY): Payer: Self-pay

## 2022-01-27 ENCOUNTER — Other Ambulatory Visit: Payer: Self-pay

## 2022-01-27 VITALS — BP 138/89 | HR 70 | Ht 65.0 in | Wt 182.0 lb

## 2022-01-27 DIAGNOSIS — G35 Multiple sclerosis: Secondary | ICD-10-CM | POA: Diagnosis not present

## 2022-01-27 DIAGNOSIS — R4189 Other symptoms and signs involving cognitive functions and awareness: Secondary | ICD-10-CM

## 2022-01-27 DIAGNOSIS — F39 Unspecified mood [affective] disorder: Secondary | ICD-10-CM

## 2022-01-27 DIAGNOSIS — D649 Anemia, unspecified: Secondary | ICD-10-CM

## 2022-01-27 DIAGNOSIS — G2581 Restless legs syndrome: Secondary | ICD-10-CM

## 2022-01-27 NOTE — Progress Notes (Signed)
Chief Complaint  Patient presents with   Follow-up    Pt alone, rm 16 she is following up. Overall she still has concerns with when over stressing it causes frustrations. Still has expressive aphasia concerns and problems with brain/cognitive. She plays the the church organ and has noticed that sometimes her hand doesn't catch up with brain playing     HISTORY OF PRESENT ILLNESS:  01/29/22 ALL:  Erica Powell is a 56 y.o. female here today for follow up for MS. She continues Betaseron. MRI 05/2021 did not show any acute changes.   She feels that she is doing well from an MS standpoint. She has occasional blurred vision. Gait is stable. When she first wakes in the morning she feels a little off balance. She does not use a cane or walker. She reports falling about 2 months ago. She was leaving church and fell on uneven pavement. She reports being evaluated by a doctor friend and was told she was ok. She is sleeping well at this time. She had some days where she would be up for 27 hours but feels this was related to stress. She has moved to a house with her boyfriend and feels financial stressors have improved. She continues ropinirole 25m at bedtime for RLS. Ultracet helps with breakthrough pain. She may take with ropinirole if RLS is acting up.   She continues to have difficulty with cognitive impairment. Neurocognitive eval with Dr RSima Matasshowed deficits with motor control, changes in visual-spatial and visual analysis capacity, visual encoding deficits  as well as significant changes in visual memory and learning. Findings consistent with changes due to MS and vascular changes. Mood and quality of sleep also contributing. She is in the process of applying for disability. She tells me that Dr RSima Matastold her she can not return to work until "at least" 09/2022. She states that she is unable to return to work due to continued cognitive deficits of irritability and inability to focus. She  is scheduled to return 02/11/2022. She continues sertraline 1017mdaily (PCP) and hydroxyzine 1015mID PRN. She has been seen by psychiatry in the past but not recently.   She continues to see Dr GriLaurann Montanagularly. Last A1C was >7. Trulicity was increased to 4.5mg17mhe has reportedly lost about 27 pounds over the past year with healthy lifestyle habits.   HISTORY (copied from Dr Dohmeier's previous note)  07-29-2021: I am seeing Erica Powell,  Today is a revisit more than 2 months after we last met but she has undergone he had COVID testing in the meantime.  I repeated a home sleep test with her which showed no apnea.  We repeated an MRI of the brain which showed no acute MS lesions her scan was still abnormal due to by lateral periventricular subcortical and pontine white matter hyperintensities.  These are usually seen with multiple sclerosis but this is not the only disease or process that could cause them.  We looked at a lacunar infarct which has been known from previous MRIs and in comparison to her MRI from January 2022 there has been only a slight progression of white matter disease.  Our concern is that it may be related to diabetes and her A1c she says is very well controlled.  She has remarkably no brain atrophy no migraines, and she is not hypertensive.  Her cervical spine MRI shows the same lesion for many years that was actually a diagnostic for MS.  She has  remained on betaseron. The patient's EEG from 6-13 2022 was normal iron studies with her primary care physician had revealed some iron deficiency anemia which is treated, underwent detailed testing the strong low number doctor of psychology and this took place in 2 sessions well the concern was that the patient has had overwhelming weakness, trembling experiences, word finding difficulties problems with focusing but also with fluently speaking.  And he found her thought process coherent her mood more dysphoric but her affect appropriate,  the formal testing was performed last week on 8-9- 22.   She had extensive formal testing that day, full report is still to follow. She is supposed to return to work tomorrow for a night shift.    I see her as depressed or frustrated. Caregiver burden.   REVIEW OF SYSTEMS: Out of a complete 14 system review of symptoms, the patient complains only of the following symptoms, irritability, inattention, depression, and all other reviewed systems are negative.   ALLERGIES: Allergies  Allergen Reactions   Cephalosporins Itching and Swelling    Occurs with cephalexin (Keflex). Starts with prickling around the face, swelling/numb feeling of face.   Hydrochlorothiazide Itching and Swelling    Swelling of the face, hives, itching, feeling of bad sunburn.    Penicillins Itching and Swelling    Occurs with Augmentin and amoxicillin. Hives, prickling, swelling of face.    Sulfa Antibiotics Itching and Swelling    Itching, hives, swelling of face   Baclofen Other (See Comments)    Leg spasms, jittery, heart racing    Clonazepam     Males me mean   Lorazepam Other (See Comments)    "makes me real short-tempered, mean"     HOME MEDICATIONS: Outpatient Medications Prior to Visit  Medication Sig Dispense Refill   aspirin 81 MG tablet Take 1 tablet (81 mg total) by mouth daily.     atorvastatin (LIPITOR) 20 MG tablet TAKE 1 TABLET BY MOUTH ONCE A DAY 90 tablet 3   betamethasone valerate (VALISONE) 0.1 % cream Apply a thin application topically to the affected area once daily. 15 g 0   BETASERON 0.3 MG KIT injection INJECT 0.3 MG INTO THE SKIN EVERY OTHER DAY. 14 kit 11   cholecalciferol (VITAMIN D) 1000 UNITS tablet Take 1,000 Units by mouth daily.     Dulaglutide (TRULICITY) 4.5 SH/7.55YO SOPN Inject 4.5 mg into the skin once a week. 2 mL 1   ferrous gluconate (FERGON) 324 MG tablet Take 324 mg by mouth daily with breakfast.     fluocinonide cream (LIDEX) 3.78 % Apply 1 application topically to  affected area 2 (two) times daily. 30 g 1   hydrOXYzine (ATARAX/VISTARIL) 10 MG tablet Take 1 tablet (10 mg total) by mouth 3 (three) times daily as needed. 90 tablet 1   lisinopril (PRINIVIL,ZESTRIL) 10 MG tablet Take 1 tablet (10 mg total) by mouth daily.     metFORMIN (GLUCOPHAGE) 1000 MG tablet Take 1 tablet (1,000 mg total) by mouth 2 (two) times daily with a meal. 180 tablet 3   rOPINIRole (REQUIP) 2 MG tablet Take 1-2 tablets (2-4 mg total) by mouth at bedtime. 60 tablet 5   sertraline (ZOLOFT) 100 MG tablet TAKE 1 TABLET BY MOUTH ONCE A DAY 90 tablet 3   traMADol-acetaminophen (ULTRACET) 37.5-325 MG tablet Take 1 tablet by mouth 2 (two) times daily as needed. 60 tablet 5   vitamin B-12 (CYANOCOBALAMIN) 1000 MCG tablet Take 1,000 mcg by mouth daily.  lisinopril (ZESTRIL) 10 MG tablet Take 1 tablet (10 mg total) by mouth daily. 90 tablet 3   atorvastatin (LIPITOR) 20 MG tablet Take 1 tablet by mouth daily.  3   Dulaglutide (TRULICITY) 3 IA/1.6PV SOPN Inject 3 mg into the skin once a week. 2 mL 11   metFORMIN (GLUCOPHAGE) 1000 MG tablet TAKE 1 TABLET BY MOUTH 2 TIMES DAILY WITH A MEAL. 180 tablet 3   predniSONE (DELTASONE) 20 MG tablet Take 1 tablet today, tomorrow (05/21/21) and additional tablet the following day if needed. (For urgent MRI prep) 3 tablet 0   No facility-administered medications prior to visit.     PAST MEDICAL HISTORY: Past Medical History:  Diagnosis Date   Anemia    Diabetes mellitus    Hyperlipemia    Hypertension    Multiple sclerosis (HCC)    RLS (restless legs syndrome)    Type 2 diabetes mellitus without (mention of) complications      PAST SURGICAL HISTORY: Past Surgical History:  Procedure Laterality Date   BONE CYST EXCISION     ESSURE TUBAL LIGATION     WISDOM TOOTH EXTRACTION  1985     FAMILY HISTORY: Family History  Problem Relation Age of Onset   Stroke Mother    Lupus Sister    Cancer Maternal Uncle        bone cancer      SOCIAL HISTORY: Social History   Socioeconomic History   Marital status: Single    Spouse name: Not on file   Number of children: 0   Years of education: Bachelor's   Highest education level: Not on file  Occupational History   Occupation: Psychiatrist: [employer]   Tobacco Use   Smoking status: Never   Smokeless tobacco: Never  Substance and Sexual Activity   Alcohol use: No   Drug use: No   Sexual activity: Not on file  Other Topics Concern   Not on file  Social History Narrative   Patient is single with no children   Patient is left handed   Patient has a Water quality scientist degree   Patient drinks 20 oz daily   Social Determinants of Health   Financial Resource Strain: Not on file  Food Insecurity: Not on file  Transportation Needs: Not on file  Physical Activity: Not on file  Stress: Not on file  Social Connections: Not on file  Intimate Partner Violence: Not on file     PHYSICAL EXAM  Vitals:   01/27/22 1314  BP: 138/89  Pulse: 70  Weight: 182 lb (82.6 kg)  Height: _0  (1.651 m)   Body mass index is 30.29 kg/m.  Generalized: Well developed, in no acute distress  Cardiology: normal rate and rhythm, no murmur auscultated  Respiratory: clear to auscultation bilaterally    Neurological examination  Mentation: Alert oriented to time, place, history taking. Follows all commands speech and language fluent Cranial nerve II-XII: Pupils were equal round reactive to light. Extraocular movements were full, visual field were full on confrontational test. Facial sensation and strength were normal. Uvula tongue midline. Head turning and shoulder shrug  were normal and symmetric. Motor: The motor testing reveals 5 over 5 strength of all 4 extremities. Good symmetric motor tone is noted throughout.  Sensory: Sensory testing is intact to soft touch on all 4 extremities. No evidence of extinction is noted.  Coordination: Cerebellar testing reveals  good finger-nose-finger and heel-to-shin bilaterally.  Gait and station: Gait is  normal.  Reflexes: Deep tendon reflexes are symmetric and normal bilaterally.    DIAGNOSTIC DATA (LABS, IMAGING, TESTING) - I reviewed patient records, labs, notes, testing and imaging myself where available.  Lab Results  Component Value Date   WBC 7.2 08/03/2018   HGB 11.0 (L) 08/03/2018   HCT 34.0 08/03/2018   MCV 89 08/03/2018   PLT 329 08/03/2018      Component Value Date/Time   NA 139 02/06/2019 1132   K 4.4 02/06/2019 1132   CL 103 02/06/2019 1132   CO2 20 02/06/2019 1132   GLUCOSE 102 (H) 02/06/2019 1132   BUN 8 02/06/2019 1132   CREATININE 0.60 02/06/2019 1132   CALCIUM 9.4 02/06/2019 1132   PROT 7.7 05/07/2021 1631   ALBUMIN 4.2 02/06/2019 1132   AST 18 02/06/2019 1132   ALT 15 02/06/2019 1132   ALKPHOS 68 02/06/2019 1132   BILITOT <0.2 02/06/2019 1132   GFRNONAA 105 02/06/2019 1132   GFRAA 121 02/06/2019 1132   No results found for: CHOL, HDL, LDLCALC, LDLDIRECT, TRIG, CHOLHDL No results found for: HGBA1C No results found for: VITAMINB12 No results found for: TSH  No flowsheet data found.   No flowsheet data found.   ASSESSMENT AND PLAN  56 y.o. year old female  has a past medical history of Anemia, Diabetes mellitus, Hyperlipemia, Hypertension, Multiple sclerosis ([place]), RLS (restless legs syndrome), and Type 2 diabetes mellitus without (mention of) complications. here with    Multiple sclerosis, relapsing-remitting (HCC)  Mood disorder (HCC)  Restless leg syndrome  Fatigue associated with anemia  Cognitive deficits  Erica Powell is doing well, physically. She will continue Betaseron as directed. Labs recently completed with PCP and reportedly normal. RLS is stable on ropinirole 32m at bedtime. She will continue. May use Ultracet sparingly for breakthrough pain. We have had a lengthy discussion regarding mood and ability to return to work. 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 I have encouraged her to consider formal evaluation with psychiatry to help with stabilization of anger, irritability and evaluation of attention deficit. She declines at this time. I will send her notes to Dr DBrett Fairyto review and determine appropriateness for return to work. She was commended for her weight loss efforts.  She will continue close follow up with PCP for co morbidity management. She will follow up with Dr DBrett Fairyfor MS follow up in 6 months. She verbalizes understanding and agreement with this plan.    No orders of the defined types were placed in this encounter.    No orders of the defined types were placed in this encounter.   I spent 30 minutes of face-to-face and non-face-to-face time with patient.  This included previsit chart review, lab review, study review, order entry, electronic health record documentation, patient education.    ADebbora Presto MSN, FNP-C 01/29/2022, 7:07 AM  [facility] [address] [place] [phone]

## 2022-01-27 NOTE — Patient Instructions (Signed)
Below is our plan:  We will continue current treatment plan. Continue Betaseron. Continue ropinirole 4mg  daily for RLS. Continue Ultracet sparingly. Do not use every day. Consider psychiatry referral to help with mood management.   Please make sure you are staying well hydrated. I recommend 50-60 ounces daily. Well balanced diet and regular exercise encouraged. Consistent sleep schedule with 6-8 hours recommended.   Please continue follow up with care team as directed.   Follow up with Dr Brett Fairy in 6 months   You may receive a survey regarding today's visit. I encourage you to leave honest feed back as I do use this information to improve patient care. Thank you for seeing me today!   Management of Memory Problems   There are some general things you can do to help manage your memory problems.  Your memory may not in fact recover, but by using techniques and strategies you will be able to manage your memory difficulties better.   1)  Establish a routine. Try to establish and then stick to a regular routine.  By doing this, you will get used to what to expect and you will reduce the need to rely on your memory.  Also, try to do things at the same time of day, such as taking your medication or checking your calendar first thing in the morning. Think about think that you can do as a part of a regular routine and make a list.  Then enter them into a daily planner to remind you.  This will help you establish a routine.   2)  Organize your environment. Organize your environment so that it is uncluttered.  Decrease visual stimulation.  Place everyday items such as keys or cell phone in the same place every day (ie.  Basket next to front door) Use post it notes with a brief message to yourself (ie. Turn off light, lock the door) Use labels to indicate where things go (ie. Which cupboards are for food, dishes, etc.) Keep a notepad and pen by the telephone to take messages   3)  Memory Aids A diary  or journal/notebook/daily planner Making a list (shopping list, chore list, to do list that needs to be done) Using an alarm as a reminder (kitchen timer or cell phone alarm) Using cell phone to store information (Notes, Calendar, Reminders) Calendar/White board placed in a prominent position Post-it notes   In order for memory aids to be useful, you need to have good habits.  It's no good remembering to make a note in your journal if you don't remember to look in it.  Try setting aside a certain time of day to look in journal.   4)  Improving mood and managing fatigue. There may be other factors that contribute to memory difficulties.  Factors, such as anxiety, depression and tiredness can affect memory. Regular gentle exercise can help improve your mood and give you more energy. Simple relaxation techniques may help relieve symptoms of anxiety Try to get back to completing activities or hobbies you enjoyed doing in the past. Learn to pace yourself through activities to decrease fatigue. Find out about some local support groups where you can share experiences with others. Try and achieve 7-8 hours of sleep at night.

## 2022-01-29 ENCOUNTER — Encounter: Payer: Self-pay | Admitting: Family Medicine

## 2022-01-29 ENCOUNTER — Telehealth: Payer: Self-pay

## 2022-01-29 ENCOUNTER — Other Ambulatory Visit (HOSPITAL_COMMUNITY): Payer: Self-pay

## 2022-01-29 NOTE — Telephone Encounter (Signed)
Received paperwork for disability from [employer]. Patient was contacted and advised that it was received. Form fee was paid.

## 2022-02-02 ENCOUNTER — Telehealth: Payer: Self-pay | Admitting: Neurology

## 2022-02-02 ENCOUNTER — Telehealth: Payer: Self-pay | Admitting: Psychology

## 2022-02-02 DIAGNOSIS — Z0289 Encounter for other administrative examinations: Secondary | ICD-10-CM

## 2022-02-02 NOTE — Telephone Encounter (Signed)
[facility] (Dr. Howell Rucks) requesting  a Peer to Peer. Received a call from disability company to contact physician. Need diagnosis and treatment to make a decision about pt's disability. Would like a call from Dr. Brett Fairy a Peer to Peer.

## 2022-02-02 NOTE — Telephone Encounter (Signed)
Dr. Regino Schultze ??)  [phone] phoned office and wants to do peer to peer on Erica Powell [birth year]     Can you call him??

## 2022-02-02 NOTE — Telephone Encounter (Signed)
Sent by my chart:  Hi Cheyane- your disability insurance called for a peer to peer -   Essence of dr Marvetta Gibbons test result. There appear to be greater right hemisphere abnormalities than left hemispheric abnormalities around clear differences between auditory versus visual memory functions with visual processing of information, visual encoding, visual memory deficits related to storage and organization all clearly indicated.  Executive functioning, general fund of knowledge, and overall speed of mental operations were all within normal limits.  The latter finding would suggest that there are more localized and focal white matter changes versus widespread microvascular ischemic changes and/or small vessel disease.    MRI shows mild progression: White matter disease.    Are you willing to undergo an evaluation for mood disorder to have this differential off the table?   Any specific problems with concentration, communication or physical function related to your work., please write to me on my chart about this.

## 2022-02-03 ENCOUNTER — Other Ambulatory Visit: Payer: Self-pay | Admitting: Neurology

## 2022-02-03 ENCOUNTER — Telehealth: Payer: Self-pay | Admitting: Neurology

## 2022-02-03 DIAGNOSIS — G35 Multiple sclerosis: Secondary | ICD-10-CM

## 2022-02-03 DIAGNOSIS — F39 Unspecified mood [affective] disorder: Secondary | ICD-10-CM

## 2022-02-03 DIAGNOSIS — R4189 Other symptoms and signs involving cognitive functions and awareness: Secondary | ICD-10-CM

## 2022-02-03 NOTE — Telephone Encounter (Signed)
Sent to [facility] ph # [phone] °

## 2022-02-03 NOTE — Telephone Encounter (Signed)
Dr Jerl Mina is asking for a call back from Dr Vickey Huger re: a needed Peer to Peer for pt pursuing disability.  He can be reached at [phone]) please call

## 2022-02-03 NOTE — Addendum Note (Signed)
Addended by: Judi Cong on: 02/03/2022 10:28 AM   Modules accepted: Orders

## 2022-02-04 NOTE — Telephone Encounter (Signed)
Called Dr Florina Ou and completed his disability related questions.  ? ? "There does appear to be some lateralizing findings which would strongly suggest an underlying neurological etiological finding consistent with changes due to MS lesioning and/or vascular changes in subcortical brain regions in the pons and underlying white matter regions.  There appear to be greater right hemisphere abnormalities than left hemispheric abnormalities around clear differences between auditory versus visual memory functions with visual processing of information, visual encoding, visual memory deficits related to storage and organization all clearly indicated."  ? ?The slow progressive nature of her MS over the last 5 years suggests dec progressive course. ?Disability is present and not likely to improve. ?Anxiety is likely a contributing factor , but is not a sole explanation for her difficulties.  ?

## 2022-02-05 NOTE — Telephone Encounter (Signed)
Patient is scheduled at [facility] to see Merian Capron, NP on 02/12/22 at 10:20 AM. ?

## 2022-02-11 ENCOUNTER — Other Ambulatory Visit (HOSPITAL_COMMUNITY): Payer: Self-pay

## 2022-02-12 ENCOUNTER — Telehealth: Payer: Self-pay | Admitting: *Deleted

## 2022-02-12 DIAGNOSIS — Z79891 Long term (current) use of opiate analgesic: Secondary | ICD-10-CM | POA: Diagnosis not present

## 2022-02-12 DIAGNOSIS — F4322 Adjustment disorder with anxiety: Secondary | ICD-10-CM | POA: Diagnosis not present

## 2022-02-12 NOTE — Telephone Encounter (Signed)
I faxed pt [place] today [phone] ?

## 2022-02-16 ENCOUNTER — Other Ambulatory Visit (HOSPITAL_COMMUNITY): Payer: Self-pay

## 2022-02-17 ENCOUNTER — Other Ambulatory Visit (HOSPITAL_COMMUNITY): Payer: Self-pay

## 2022-02-26 NOTE — Telephone Encounter (Signed)
Hey Ms. Cuadrado, ?We did receive the office notes and Dr Dohmeier reviewed them.  ?The reason we sent you to them is because Dr Kieth Brightly and Dr Dohmeier felt that some of your symptoms that you are experiencing can be worsened by increase stress and anxiety. By referring you to the Psych and counseling center our goal would be to have them address and treat these moods/feelings with the hope of your symptoms possibly getting better with a treatment plan.  ?In the notes it is states that you declined treatment and therapy through them. I see that Dr Valentina Lucks manages the sertraline, Dr Dohmeier states it may be beneficial to discuss potentially increasing that medication or address if there is something he would recommend to help with stress/anxiety/moods.  ? ?I just wanted to let you know that we had received the notes and reviewed them.  ?

## 2022-03-01 ENCOUNTER — Other Ambulatory Visit: Payer: Self-pay | Admitting: Neurology

## 2022-03-01 ENCOUNTER — Other Ambulatory Visit (HOSPITAL_COMMUNITY): Payer: Self-pay

## 2022-03-02 ENCOUNTER — Other Ambulatory Visit (HOSPITAL_COMMUNITY): Payer: Self-pay

## 2022-03-02 DIAGNOSIS — Z01419 Encounter for gynecological examination (general) (routine) without abnormal findings: Secondary | ICD-10-CM | POA: Diagnosis not present

## 2022-03-02 DIAGNOSIS — N952 Postmenopausal atrophic vaginitis: Secondary | ICD-10-CM | POA: Diagnosis not present

## 2022-03-02 DIAGNOSIS — Z6830 Body mass index (BMI) 30.0-30.9, adult: Secondary | ICD-10-CM | POA: Diagnosis not present

## 2022-03-02 DIAGNOSIS — Z124 Encounter for screening for malignant neoplasm of cervix: Secondary | ICD-10-CM | POA: Diagnosis not present

## 2022-03-02 DIAGNOSIS — Z1231 Encounter for screening mammogram for malignant neoplasm of breast: Secondary | ICD-10-CM | POA: Diagnosis not present

## 2022-03-02 DIAGNOSIS — N879 Dysplasia of cervix uteri, unspecified: Secondary | ICD-10-CM | POA: Diagnosis not present

## 2022-03-02 MED ORDER — SERTRALINE HCL 100 MG PO TABS
100.0000 mg | ORAL_TABLET | Freq: Every day | ORAL | 4 refills | Status: DC
Start: 2022-03-02 — End: 2022-12-30
  Filled 2022-03-02: qty 90, 90d supply, fill #0
  Filled 2022-05-22: qty 90, 90d supply, fill #1
  Filled 2022-08-24: qty 30, 30d supply, fill #2
  Filled 2022-09-25: qty 30, 30d supply, fill #3
  Filled 2022-10-06 – 2022-11-06 (×2): qty 30, 30d supply, fill #4
  Filled 2022-12-06: qty 30, 30d supply, fill #5

## 2022-03-02 MED ORDER — ROPINIROLE HCL 2 MG PO TABS
2.0000 mg | ORAL_TABLET | Freq: Every day | ORAL | 11 refills | Status: DC
  Filled 2022-03-02: qty 60, 30d supply, fill #0
  Filled 2022-03-29: qty 60, 30d supply, fill #1
  Filled 2022-05-14: qty 60, 30d supply, fill #2
  Filled 2022-06-08: qty 60, 30d supply, fill #3
  Filled 2022-07-08: qty 60, 30d supply, fill #4
  Filled 2022-08-14: qty 60, 30d supply, fill #5
  Filled 2022-09-07: qty 60, 30d supply, fill #6
  Filled 2022-10-05: qty 60, 30d supply, fill #7
  Filled 2022-11-06: qty 60, 30d supply, fill #8
  Filled 2022-12-03: qty 60, 30d supply, fill #9
  Filled 2022-12-26 – 2022-12-29 (×2): qty 60, 30d supply, fill #10

## 2022-03-09 ENCOUNTER — Other Ambulatory Visit (HOSPITAL_COMMUNITY): Payer: Self-pay

## 2022-03-09 DIAGNOSIS — Z0271 Encounter for disability determination: Secondary | ICD-10-CM

## 2022-03-12 ENCOUNTER — Other Ambulatory Visit (HOSPITAL_COMMUNITY): Payer: Self-pay

## 2022-03-13 ENCOUNTER — Other Ambulatory Visit (HOSPITAL_COMMUNITY): Payer: Self-pay

## 2022-03-16 ENCOUNTER — Other Ambulatory Visit (HOSPITAL_COMMUNITY): Payer: Self-pay

## 2022-03-17 ENCOUNTER — Other Ambulatory Visit: Payer: Self-pay

## 2022-03-17 ENCOUNTER — Other Ambulatory Visit (HOSPITAL_COMMUNITY): Payer: Self-pay

## 2022-03-17 MED ORDER — TRULICITY 4.5 MG/0.5ML ~~LOC~~ SOAJ
4.5000 mg | SUBCUTANEOUS | 11 refills | Status: DC
  Filled 2022-03-17: qty 6, 84d supply, fill #0
  Filled 2022-06-08 – 2022-07-01 (×3): qty 2, 28d supply, fill #1
  Filled 2022-07-09 – 2022-07-27 (×3): qty 2, 28d supply, fill #2
  Filled 2022-08-17: qty 2, 28d supply, fill #3
  Filled 2022-09-14: qty 2, 28d supply, fill #4
  Filled 2022-10-14: qty 2, 28d supply, fill #5
  Filled 2022-11-06: qty 2, 28d supply, fill #6
  Filled 2022-11-29: qty 2, 28d supply, fill #7
  Filled 2022-12-26: qty 2, 28d supply, fill #8
  Filled 2023-01-27: qty 2, 28d supply, fill #9

## 2022-03-18 ENCOUNTER — Other Ambulatory Visit (HOSPITAL_COMMUNITY): Payer: Self-pay

## 2022-03-18 NOTE — Telephone Encounter (Signed)
Dr. Shelva Majestic had wanted to repeat the memory examination after you start on a antidepressant- that's all we will need.  ?Basically , we need to demonstrate that your mood/ depression/ anxiety is not the driving factor of your deficiencies in cognition.  ? ?I am happy to see you earlier and we will do a variant of MOCA, not the standard test , here. ? ?Nurse Lewie Chamber is out this week .  ? ? ?  ?

## 2022-03-24 ENCOUNTER — Other Ambulatory Visit (HOSPITAL_COMMUNITY): Payer: Self-pay

## 2022-03-25 ENCOUNTER — Encounter: Payer: Self-pay | Admitting: Family Medicine

## 2022-03-30 ENCOUNTER — Other Ambulatory Visit (HOSPITAL_COMMUNITY): Payer: Self-pay

## 2022-03-30 ENCOUNTER — Ambulatory Visit: Payer: 59 | Admitting: Neurology

## 2022-03-30 ENCOUNTER — Encounter: Payer: Self-pay | Admitting: Neurology

## 2022-03-30 VITALS — BP 136/86 | HR 98 | Ht 65.0 in | Wt 182.5 lb

## 2022-03-30 DIAGNOSIS — R41840 Attention and concentration deficit: Secondary | ICD-10-CM | POA: Diagnosis not present

## 2022-03-30 DIAGNOSIS — G35 Multiple sclerosis: Secondary | ICD-10-CM

## 2022-03-30 DIAGNOSIS — E0840 Diabetes mellitus due to underlying condition with diabetic neuropathy, unspecified: Secondary | ICD-10-CM

## 2022-03-30 MED ORDER — HYDROXYZINE HCL 10 MG PO TABS
10.0000 mg | ORAL_TABLET | Freq: Three times a day (TID) | ORAL | 1 refills | Status: DC | PRN
  Filled 2022-03-30: qty 90, 30d supply, fill #0
  Filled 2022-05-21: qty 90, 30d supply, fill #1

## 2022-03-30 MED ORDER — BETASERON 0.3 MG ~~LOC~~ KIT
PACK | SUBCUTANEOUS | 11 refills | Status: DC
  Filled 2022-03-30: qty 14, fill #0
  Filled 2022-04-09: qty 14, 28d supply, fill #0

## 2022-03-30 MED ORDER — BETAMETHASONE VALERATE 0.1 % EX CREA
1.0000 "application " | TOPICAL_CREAM | Freq: Every day | CUTANEOUS | 0 refills | Status: DC
  Filled 2022-03-30: qty 15, 7d supply, fill #0

## 2022-03-30 NOTE — Progress Notes (Signed)
. ? ? ? ?PATIENT: Erica Powell ?DOB: September 17, 1966 ? ?REASON FOR VISIT: follow up ?HISTORY FROM: patient here alone. ? ? ?03-30-2022; ?Erica Powell has seen Dr Jefm Miles and had frequent phone conversations with [place]: she has seen a psychiatrist- who found no evidence of mood disorder.  ?Zoloft 100 mg has not made a difference and she was willing to continue the medication-  ?She reports ongoing forgetfulness, forgets bills and check book balancing were off. 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  ?Biggest thing is her distractibility- she cannot multi task any longer.  ?All this affect her employability. She is prone to accidents in the pharmacy.   ? ?MRI : Her white matter changes in her brain are clearly not normal for age.  ? Abnormal MRI scan of the brain showing bilateral periventricular subcortical and pontine nonspecific white matter hyperintensities which are compatible with but not necessarily specific for multiple sclerosis and may also be seen in small vessel disease or vasculitic conditions.  No enhancing lesions are noted.  Remote age left basal ganglia cystic lesion is noted possibly remote age lacunar infarct.  No significant atrophy is noted.  Overall slight progression of white matter changes compared to previous MRI from January 2022. ? ?There was no evidence of vasculitis. ?There is reportedly , according to psychiatrist, no mood disorder present.  ?  ?I support her long term disability claim.  ? ? ?07-29-2021: ?I am seeing Erica Powell,  ?Today is a revisit more than 2 months after we last met but she has undergone he had COVID testing in the meantime.  I repeated a home sleep test with her which showed no apnea.  We repeated an MRI of the brain which showed no acute  MS lesions her scan was still abnormal due to by lateral periventricular subcortical and pontine white matter hyperintensities.  These are usually seen with multiple sclerosis but this is not the only disease or process that could cause them.  We looked at a lacunar infarct which has been known from previous MRIs and in comparison to her MRI from January 2022 there has been only a slight progression of white matter disease.  Our concern is that it may be related to diabetes and her A1c she says is very well controlled.  She has remarkably no brain atrophy no migraines, and she is not hypertensive.  Her cervical spine MRI shows the same lesion for many years that was actually a diagnostic for MS.  She has remained on betaseron. ?The patient's EEG from 6-13 2022 was normal iron studies with her primary care physician had revealed some iron deficiency anemia which is treated, underwent detailed testing the strong low number doctor of psychology and this took place in 2 sessions well the concern was that the patient has had overwhelming weakness, trembling experiences, word finding difficulties problems with focusing but also with fluently speaking.  And he found her thought process coherent her mood more dysphoric but her affect appropriate, the formal testing was performed last week on 8-9- 22.  ? She had extensive formal testing that day, full report is still to follow. She is supposed to return to work tomorrow for a night shift.  ? ?I see her as depressed or frustrated. Caregiver burden.  ? ? ? ? ? ? ?  05-07-2021: URGENT WID-Rv for this MS patient with recent excessive fatigue, weakness spells, falls ! Presents to follow up with new problems , question of MS exacerbation. She recently had a surgical procedure M(ay 18th, dr Gertie Fey)  and noticed  since then she began to decline. 2 weeks ago she was walking down her steps and slipped on last 3 steps,  bruising her foot. 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 She got off work only to fall asleep in her car in the driveway, overslept by an hour. Couldn't finish her work day,  ?Her vision has changed, she was unable to keep the car straight in the lane.  ?Had an episode of slurred speech last Wednesday , a week ago, and her arms felt so heavy "like trembling". ? 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 ?The patient underwent an MRI of the brain on January 6 of this year the MRI showed hyperintense foci in the pons and in the hemispheres some of them are radially oriented in the form of Dawson's fingers, the ones in the pons had the appearance more consistent with microvascular changes than the demyelination.  She has a chronic lacune in the left external capsule this is most likely due to the risk factor of hypertension also patients with diabetes and with migraines can develop lacunar infarct lacunar infarctions.  Nothing was acute on this last MRI that was compared to a study from April 2020. ?Chronic secondary progressive, MS- no longer relapsing remitting.   ? ? ?07-31-2020:The patient is here alone, well established 56 year -old MS patient . She states that overall things are well. She lost her companion in February and considers the Covid vaccine the cause of his death at age 22. She misses him daily and deeply. Has reported that she feels is like bone pain. She feels this on tops of arms and legs, is using the Requip and Ultracet.  ?She states that her RLS has been acting up. Last time ferritin level checked  08/16/19 and was 14.6- low. 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 ? ? ?Rv is a Education officer, environmental, with a long standing history of MS and RLS.  ?She takes care of her mother in [place] ( who had a  stroke)  and reports recently an increase in RLS and drop in ferritin( 9.9.2020 14.6) . She had a JCV panel in July 2020- negative.  ?We are discussing starting po iron versus iv- she has taken po iron and has not had a benefit. She reports pounding her legs in pain.   ? ? ?Rv from 06-12-2019, Chantal Headlee had undergone MRI imaging and we are meeting today to discuss the findings. MRI with slowly progressive demyelination - compared to studies from 2006 on. 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  ? ?02-06-2019, RV with Chantal Underhill, a hospital pharmacy technician is seen in a RV.  Ms. Vanderslice has moved with her women's hospital crew to the main [facility], she reports no changes in her overall neurologic health but may be decrease in her short-term memory dysfunction.  1 of her next visits will need to be associated  with a Moca test. ?She has had no falls she can wear high heels, no problems with tandem gait/ heel -walk.  No changes in penmanship. ? ?Reviewed medications there has been no change here she stays on ropinirole at bedtime, takes Betaseron every other day injection 0.3 mg. And iron supplements by mouth. ?  ?Patent has seen [name] 3 times since my last visit with her, dated  ?06/28/2017: Davie is returning today for a six-month revisit she had over the last 6 months taking an increased dose of Requip which has not helped her restless legs. She has some myoclonic jerking supple movements in both lower extremities, but mostly on the right. It starts as a painful spasm and then releases suddenly. It is very bothersome. The therapy works has not relieved the distress either. She has started dropping things more than usual, she may drop something right out of her hands. She has sometimes double vision, when she gets fatigue her left eye abducts to the center creating a horizontal diplopia. She also states that she  has some cognitive problems forgets what she is doing, sometimes she has to talk to her legs to move, the jerks have gotten worse sometimes it banged up against the desk or buckle and she is walking. In th

## 2022-03-30 NOTE — Patient Instructions (Signed)
Mild Neurocognitive Disorder Mild neurocognitive disorder, formerly known as mild cognitive impairment, is a disorder in which memory does not work as well as it should. This disorder may also cause problems with other mental functions, including thought, communication, behavior, and completion of tasks. These problems can be noticed and measured, but they usually do not interfere with daily activities or the ability to live independently. Mild neurocognitive disorder typically develops after 55 years of age, but it can also develop at younger ages. It is not as serious as major neurocognitive disorder, also known as dementia, but it may be the first sign of it. Generally, symptoms of this condition get worse over time. In rare cases, symptoms can get better. What are the causes? This condition may be caused by: Brain disorders like Alzheimer's disease, Parkinson's disease, and other conditions that gradually damage nerve cells (neurodegenerative conditions). Diseases that affect blood vessels in the brain and result in small strokes. Certain infections, such as HIV. Traumatic brain injury. Other medical conditions, such as brain tumors, underactive thyroid (hypothyroidism), and vitamin B12 deficiency. Use of certain drugs or prescription medicines. What increases the risk? The following factors may make you more likely to develop this condition: Being older than 65 years. Being female. Low education level. Diabetes, high blood pressure, high cholesterol, and other conditions that increase the risk for blood vessel diseases. Untreated or undertreated sleep apnea. Having a certain type of gene that can be passed from parent to child (inherited). Chronic health problems such as heart disease, lung disease, liver disease, kidney disease, or depression. What are the signs or symptoms? Symptoms of this condition include: Difficulty remembering. You may: Forget names, phone numbers, or details of  recent events. Forget social events and appointments. Repeatedly forget where you put your car keys or other items. Difficulty thinking and solving problems. You may have trouble with complex tasks, such as: Paying bills. Driving in unfamiliar places. Difficulty communicating. You may have trouble: Finding the right word or naming an object. Forming a sentence that makes sense, or understanding what you read or hear. Changes in your behavior or personality. When this happens, you may: Lose interest in the things that you used to enjoy. Withdraw from social situations. Get angry more easily than usual. Act before thinking. How is this diagnosed? This condition is diagnosed based on: Your symptoms. Your health care provider may ask you and the people you spend time with, such as family and friends, about your symptoms. Evaluation of mental functions (neuropsychological testing). Your health care provider may refer you to a neurologist or mental health specialist to evaluate your mental functions in detail. To identify the cause of your condition, your health care provider may: Get a detailed medical history. Ask about use of alcohol, drugs, and prescription medicines. Do a physical exam. Order blood tests and brain imaging exams. How is this treated? Mild neurocognitive disorder that is caused by medicine use, drug use, infection, or another medical condition may improve when the cause is treated, or when medicines or drugs are stopped. If this disorder has another cause, it generally does not improve and may get worse. In these cases, the goal of treatment is to help you manage the loss of mental function. Treatments in these cases include: Medicine. Medicine mainly helps memory and behavior symptoms. Talk therapy. Talk therapy provides education, emotional support, memory aids, and other ways of making up for problems with mental function. Lifestyle changes, including: Getting regular  exercise. Eating a healthy diet   that includes omega-3 fatty acids. Challenging your thinking and memory skills. Having more social interaction. Follow these instructions at home: Eating and drinking  Drink enough fluid to keep your urine pale yellow. Eat a healthy diet that includes omega-3 fatty acids. These can be found in: Fish. Nuts. Leafy vegetables. Vegetable oils. If you drink alcohol: Limit how much you use to: 0-1 drink a day for women. 0-2 drinks a day for men. Be aware of how much alcohol is in your drink. In the [place] one drink equals one 12 oz bottle of beer (355 mL), one 5 oz glass of wine (148 mL), or one 1 oz glass of hard liquor (44 mL). Lifestyle  Get regular exercise as told by your health care provider. Do not use any products that contain nicotine or tobacco, such as cigarettes, e-cigarettes, and chewing tobacco. If you need help quitting, ask your health care provider. Practice ways to manage stress. If you need help managing stress, ask your health care provider. Continue to have social interaction. Keep your mind active with stimulating activities you enjoy, such as reading or playing games. Make sure to get quality sleep. Follow these tips: Avoid napping during the day. Keep your sleeping area dark and cool. Avoid exercising during the few hours before you go to bed. Avoid caffeine products in the evening. General instructions Take over-the-counter and prescription medicines only as told by your health care provider. Your health care provider may recommend that you avoid taking medicines that can affect thinking, such as pain medicines or sleep medicines. Work with your health care provider to find out what you need help with and what your safety needs are. Keep all follow-up visits. This is important. Where to find more information National Institute on Aging: www.nia.nih.gov Contact a health care provider if: You have any new symptoms. Get help right  away if: You develop new confusion or your confusion gets worse. You act in ways that place you or your family in danger. Summary Mild neurocognitive disorder is a disorder in which memory does not work as well as it should. Mild neurocognitive disorder can have many causes. It may be the first stage of dementia. To manage your condition, get regular exercise, keep your mind active, get quality sleep, and eat a healthy diet. This information is not intended to replace advice given to you by your health care provider. Make sure you discuss any questions you have with your health care provider. Document Revised: 04/08/2020 Document Reviewed: 04/08/2020 Elsevier Patient Education  2023 [employer]  

## 2022-04-01 ENCOUNTER — Other Ambulatory Visit (HOSPITAL_COMMUNITY): Payer: Self-pay

## 2022-04-09 ENCOUNTER — Encounter: Payer: Self-pay | Admitting: Family Medicine

## 2022-04-09 ENCOUNTER — Other Ambulatory Visit (HOSPITAL_COMMUNITY): Payer: Self-pay

## 2022-04-15 ENCOUNTER — Other Ambulatory Visit (HOSPITAL_COMMUNITY): Payer: Self-pay

## 2022-04-15 MED ORDER — FARXIGA 5 MG PO TABS
5.0000 mg | ORAL_TABLET | Freq: Every day | ORAL | 3 refills | Status: DC
  Filled 2022-04-15 – 2022-06-08 (×3): qty 30, 30d supply, fill #0

## 2022-04-16 ENCOUNTER — Other Ambulatory Visit (HOSPITAL_COMMUNITY): Payer: Self-pay

## 2022-04-20 ENCOUNTER — Other Ambulatory Visit (HOSPITAL_COMMUNITY): Payer: Self-pay

## 2022-04-20 ENCOUNTER — Ambulatory Visit: Payer: 59 | Admitting: Neurology

## 2022-04-30 ENCOUNTER — Encounter: Payer: Self-pay | Admitting: Family Medicine

## 2022-05-14 ENCOUNTER — Other Ambulatory Visit (HOSPITAL_COMMUNITY): Payer: Self-pay

## 2022-05-21 ENCOUNTER — Other Ambulatory Visit: Payer: Self-pay | Admitting: Neurology

## 2022-05-21 ENCOUNTER — Other Ambulatory Visit (HOSPITAL_COMMUNITY): Payer: Self-pay

## 2022-05-22 ENCOUNTER — Other Ambulatory Visit (HOSPITAL_COMMUNITY): Payer: Self-pay

## 2022-05-22 MED ORDER — BETAMETHASONE VALERATE 0.1 % EX CREA
1.0000 | TOPICAL_CREAM | Freq: Every day | CUTANEOUS | 1 refills | Status: DC
  Filled 2022-05-22: qty 15, 30d supply, fill #0
  Filled 2022-07-08: qty 15, 30d supply, fill #1

## 2022-05-25 ENCOUNTER — Other Ambulatory Visit (HOSPITAL_COMMUNITY): Payer: Self-pay

## 2022-05-25 MED ORDER — TRAMADOL-ACETAMINOPHEN 37.5-325 MG PO TABS
1.0000 | ORAL_TABLET | Freq: Two times a day (BID) | ORAL | 5 refills | Status: DC | PRN
  Filled 2022-05-25: qty 60, 30d supply, fill #0
  Filled 2022-07-02: qty 60, 30d supply, fill #1
  Filled 2022-07-27: qty 60, 30d supply, fill #2

## 2022-05-25 NOTE — Telephone Encounter (Signed)
Patient is up to date on her appointments. Patient is due for a refill on tramadol. [place] Controlled Substance Registry checked and is appropriate.  

## 2022-05-27 ENCOUNTER — Encounter: Payer: Self-pay | Attending: Psychology | Admitting: Psychology

## 2022-05-27 ENCOUNTER — Encounter: Payer: Self-pay | Admitting: Psychology

## 2022-05-27 DIAGNOSIS — R41840 Attention and concentration deficit: Secondary | ICD-10-CM

## 2022-05-27 DIAGNOSIS — F418 Other specified anxiety disorders: Secondary | ICD-10-CM

## 2022-05-27 DIAGNOSIS — G35 Multiple sclerosis: Secondary | ICD-10-CM | POA: Insufficient documentation

## 2022-05-27 DIAGNOSIS — R413 Other amnesia: Secondary | ICD-10-CM

## 2022-05-27 DIAGNOSIS — G4719 Other hypersomnia: Secondary | ICD-10-CM

## 2022-05-27 DIAGNOSIS — G35A Relapsing-remitting multiple sclerosis: Secondary | ICD-10-CM

## 2022-05-27 DIAGNOSIS — R251 Tremor, unspecified: Secondary | ICD-10-CM
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
## 2022-06-03 ENCOUNTER — Encounter: Payer: Self-pay | Admitting: Neurology

## 2022-06-04 ENCOUNTER — Telehealth: Payer: Self-pay | Admitting: *Deleted

## 2022-06-04 ENCOUNTER — Other Ambulatory Visit (HOSPITAL_COMMUNITY): Payer: Self-pay

## 2022-06-04 NOTE — Telephone Encounter (Signed)
Erica Powell called to inform Dr Kieth Brightly to send her paperwork directly to [facility].

## 2022-06-10 ENCOUNTER — Other Ambulatory Visit (HOSPITAL_COMMUNITY): Payer: Self-pay

## 2022-06-17 ENCOUNTER — Other Ambulatory Visit (HOSPITAL_COMMUNITY): Payer: Self-pay

## 2022-07-01 ENCOUNTER — Other Ambulatory Visit (HOSPITAL_COMMUNITY): Payer: Self-pay

## 2022-07-02 ENCOUNTER — Other Ambulatory Visit (HOSPITAL_COMMUNITY): Payer: Self-pay

## 2022-07-03 ENCOUNTER — Other Ambulatory Visit (HOSPITAL_COMMUNITY): Payer: Self-pay

## 2022-07-08 ENCOUNTER — Other Ambulatory Visit (HOSPITAL_COMMUNITY): Payer: Self-pay

## 2022-07-09 ENCOUNTER — Other Ambulatory Visit (HOSPITAL_COMMUNITY): Payer: Self-pay

## 2022-07-14 ENCOUNTER — Other Ambulatory Visit (HOSPITAL_COMMUNITY): Payer: Self-pay

## 2022-07-21 ENCOUNTER — Other Ambulatory Visit (HOSPITAL_COMMUNITY): Payer: Self-pay

## 2022-07-27 ENCOUNTER — Other Ambulatory Visit: Payer: Self-pay | Admitting: Neurology

## 2022-07-28 ENCOUNTER — Telehealth: Payer: Self-pay | Admitting: Neurology

## 2022-07-28 ENCOUNTER — Other Ambulatory Visit (HOSPITAL_COMMUNITY): Payer: Self-pay

## 2022-07-28 ENCOUNTER — Ambulatory Visit: Payer: 59 | Admitting: Neurology

## 2022-07-28 ENCOUNTER — Encounter: Payer: Self-pay | Admitting: Neurology

## 2022-07-28 VITALS — BP 157/92 | HR 78 | Ht 65.0 in | Wt 184.0 lb

## 2022-07-28 DIAGNOSIS — R2689 Other abnormalities of gait and mobility: Secondary | ICD-10-CM | POA: Insufficient documentation

## 2022-07-28 DIAGNOSIS — G35 Multiple sclerosis: Secondary | ICD-10-CM

## 2022-07-28 DIAGNOSIS — R41840 Attention and concentration deficit: Secondary | ICD-10-CM | POA: Diagnosis not present

## 2022-07-28 DIAGNOSIS — Z9181 History of falling: Secondary | ICD-10-CM

## 2022-07-28 DIAGNOSIS — M791 Myalgia, unspecified site: Secondary | ICD-10-CM

## 2022-07-28 MED ORDER — HYDROXYZINE HCL 10 MG PO TABS
10.0000 mg | ORAL_TABLET | Freq: Three times a day (TID) | ORAL | 1 refills | Status: DC | PRN
Start: 2022-07-28 — End: 2023-07-31
  Filled 2022-07-28: qty 90, 30d supply, fill #0
  Filled 2022-08-28: qty 90, 30d supply, fill #1

## 2022-07-28 MED ORDER — BETASERON 0.3 MG ~~LOC~~ KIT
PACK | SUBCUTANEOUS | 11 refills | Status: DC
  Filled 2022-07-28: qty 14, 28d supply, fill #0

## 2022-07-28 MED ORDER — GABAPENTIN 100 MG PO CAPS
100.0000 mg | ORAL_CAPSULE | Freq: Every day | ORAL | 5 refills | Status: DC
  Filled 2022-07-28: qty 30, 30d supply, fill #0

## 2022-07-28 NOTE — Progress Notes (Signed)
[name]    PATIENT: Erica Powell DOB: August 17, 1966  REASON FOR VISIT: follow up HISTORY FROM: patient here alone.  RV 07-28-2022: RV Erica Powell presents with concerns about her MS condition, Erica Powell had seen Dr Jefm Miles again in June 2023, for a meeting.  He supports her disability claims.    She  had frequent phone conversations with [place] since Summer 2022: in the meantime she has seen a psychiatrist- who found no evidence of mood disorder.  Zoloft 100 mg has not made a difference and she was willing to continue the medication-  She reports ongoing forgetfulness, forgets bills and check book balancing were off.  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  Biggest thing is her distractibility- she cannot multi task any longer.  All this affect her employability. 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 Her supervisor has clearly stated that her work performance was seriously impaired.    MRI : Her white matter changes in her brain are clearly not normal for age. We can go back to 03-2005 and compare images.   Abnormal MRI scan of the brain showing bilateral periventricular subcortical and pontine nonspecific white matter hyperintensities which are compatible with but not necessarily specific for multiple sclerosis and may also be seen in small vessel disease or vasculitic conditions.   No enhancing lesions are noted.  Remote age left basal ganglia cystic lesion is noted possibly remote age lacunar infarct.  No significant atrophy is noted.  Overall slight progression of white matter changes compared to previous MRI from January 2022.  There was no evidence of vasculitis.  MRI cervical  spine : 2020 Spinal cord: Minimal residual signal abnormality within the right cord at the C4-5 level and in the left cord at the C5-6 level. No new or active demyelinating  lesion. No abnormal contrast enhancement.   Posterior Fossa, vertebral arteries, paraspinal tissues: Visualized posterior fossa is normal. Vertebral artery flow voids are preserved. No prevertebral soft tissue swelling.   CSF was positive for oligoclonal bands in 2006. MS diagnosis.   There is reportedly , according to psychiatrist, no mood disorder present.    I support her long term disability claim.  The patent has not been abe to work since 09-2021, based on cognitive impairment, difficulties with reading letters and numbers, inability to multitask, unable to focus.  Physically limited by back pain, leg myoclonus, gait instability. Left eye abducens weakness.  The combination of cognitive and physical symptoms makes her employment impossible.     07-29-2021: I am seeing Erica Powell,  Today is a revisit more than 2 months after we last met but she has undergone he had COVID testing in the meantime.  I repeated a home sleep test with her which showed no apnea.  We repeated an MRI of the brain which showed no acute MS lesions her scan was still abnormal due to by lateral periventricular subcortical and pontine white matter hyperintensities.  These are usually seen with multiple sclerosis but this is not the only disease or process that could cause them.  We looked at a lacunar infarct which has been known from previous MRIs and in comparison to her MRI from January 2022  there has been only a slight progression of white matter disease.  Our concern is that it may be related to diabetes and her A1c she says is very well controlled.  She has remarkably no brain atrophy no migraines, and she is not hypertensive.  Her cervical spine MRI shows the same lesion for many years that was actually a diagnostic for MS.  She has  remained on betaseron. The patient's EEG from 6-13 2022 was normal iron studies with her primary care physician had revealed some iron deficiency anemia which is treated, underwent detailed testing the strong low number doctor of psychology and this took place in 2 sessions well the concern was that the patient has had overwhelming weakness, trembling experiences, word finding difficulties problems with focusing but also with fluently speaking.  And he found her thought process coherent her mood more dysphoric but her affect appropriate, the formal testing was performed last week on 8-9- 22.   She had extensive formal testing that day, full report is still to follow.  She is supposed to return to work tomorrow for a night shift.  I see her as depressed or frustrated by caregiver burden.        05-07-2021: URGENT WID-Rv for this MS patient with recent excessive fatigue, weakness spells, falls ! Presents to follow up with new problems , question of MS exacerbation. She recently had a surgical procedure M(ay 18th, dr Gertie Fey)  and noticed  since then she began to decline. 2 weeks ago she was walking down her steps and slipped on last 3 steps,  bruising her foot. 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 She got off work only to fall asleep in her car in the driveway, overslept by an hour. Couldn't finish her work day,  Her vision has changed, she was unable to keep the car straight in the lane.  Had an episode of slurred speech last Wednesday , a week ago, and her arms felt so heavy "like trembling".Now presenting for forgetfulness, cognitive impairment, unable to multitask, calculate or spell as she used to. Even her piano play has suffered, she has a delayed hand eye coordination, and she missed clues to coordinate her organ with the priest and choir.   She has been seen by psychiatrist, has been on Zoloft, increased from 50 to  100 mg and there is according to her report no improvement , psychiatrist reportedly did not feel a mood disorder to be present.  Her ability to function at work has been severely impaired, her supervisor was concerned about her decline in accuracy of calculation - and the severe problems this could have caused.  Brain MRI was showing white matter disease, slow progressive. She has positive oligoclonal bands- MS diagnosis is undisputed.  Remains on Betaseron , she was thus far  not interested in tysabri or MAB therapy.    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 The patient underwent an MRI of the brain on January 6 of this year the MRI showed hyperintense foci in the pons and in the hemispheres some of them are radially oriented in the form of Dawson's fingers, the ones in the pons had the appearance more consistent with microvascular changes than the demyelination.  She has a chronic lacune in the left external capsule this is most likely due to the risk factor of hypertension also patients with  diabetes and with migraines can develop lacunar infarct lacunar infarctions.  Nothing was acute on this last MRI that was compared to a study from April 2020. Chronic secondary progressive, MS- no longer relapsing remitting.     07-31-2020:The patient is here alone, well established 56 year -old MS patient . She states that overall things are well. She lost her companion in February and considers the Covid vaccine the cause of his death at age 42. She misses him daily and deeply. Has reported that she feels is like bone pain. She feels this on tops of arms and legs, is using the Requip and Ultracet.  She states that her RLS has been acting up. Last time ferritin level checked  08/16/19 and was 14.6- low. 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  She takes care of her mother in [place] ( who had a stroke)  and reports recently an increase in RLS and drop in ferritin( 9.9.2020 14.6) . She had a JCV panel in July 2020- negative.  We are discussing starting po iron versus iv- she has taken po iron and has not had a benefit. She reports pounding her legs in pain.     Rv from 06-12-2019, Erica Powell had undergone MRI imaging and we are meeting today to discuss the findings. MRI with slowly progressive demyelination - compared to studies from 2006 on. 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  Ms. 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  1 of her next visits will need to be associated with a Moca test. She has had no falls she can wear high heels, no problems with tandem gait/ heel -walk.  No changes in penmanship.  Reviewed medications there has been no change here she stays on ropinirole at bedtime, takes Betaseron every other day injection 0.3 mg. And iron supplements by mouth.   Patent has seen Erica Powell 3 times since my last visit with her, dated  06/28/2017: Erica Powell is returning today for a six-month revisit she had over the last 6 months taking an increased dose of Requip which has not helped her restless legs. She has some myoclonic jerking supple movements in both lower extremities, but mostly on the right. It starts as a painful spasm and then releases suddenly. It is very bothersome. The therapy works has not relieved the distress either. She has started dropping things more than usual, she may drop something right out of her  hands. She has sometimes double vision, when she gets fatigue her left eye abducts to the center creating a horizontal diplopia. She also states that she has some cognitive problems forgets what she is doing, sometimes she has to talk to her legs to move, the jerks have gotten worse sometimes it banged up against the desk or buckle and she is walking. In the morning however she is stiff as well as when she stands for long periods at a time or sitting for longer time  at the computer. She also has urinary problems. She sweats profusely when she mops, does homework housework physical work it is not like a hot flash. She wonders if it could be a mass. She states that she feels " pretty much like crap all the time not just the normal tiredness".   HISTORY (Fowler Antos): Erica Powell is a 55 y.o. left handed, caucasian female Is seen here as a routine once a year revisit for multiple sclerosis, originally referred from Dr. Laurann Montana.  Erica Powell has been a patient in our neurology clinic for the last 8 years, beginning in 2006.   She was diagnosed with multiple sclerosis and treatment was initiated. She had no recent MS relapses but a brain MRI that had been performed in January 2014 was showing still scattered periventricular, subcortical and pontine T2 hyperintensities consistent with chronic demyelinating plaques of various age.   No acute lesions were seen in her last MRI obtained on 12/29/2012. She had 2 separate studies in 2006 at [place].  Initally she had C5 and C 6 lesions, these dorsal cervical cord lesions did not span multiple levels. The patient was diagnosed in 2012 with diabetes and has a strong DM family history.  Her last HbA1c just 2 month ago was 7.0 and had been in June 2014 at 8.3.  She noticed an improvement in her urinary frequency once her sugars were lower. She also has still no clinical progression or relapse, but she reports problems with concentration, cognition and sometimes with  multitasking. She feels that she is easily distracted and loses her train of thought when this happens. She feels especially distracted by noises at her work. 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 There's been no change in her employment, hours of work , hobbies or social life. She has a bachelor's degree ,is single but lives with her fianc.  She recently noted a new symptom of tightness in her chest , a paraspinal tenderness and tightness, a confined feeling surrounding the chest , a possible variation of " the MS hug", she has responded well to massage, but the relief is not sustained.    She is a 7[employer] day [employer].       REVIEW OF SYSTEMS: Out of a complete 14 system review of symptoms, the patient complains only of the following symptoms, and all other reviewed systems are negative.  She has a new onset tremble, low amplitude , affecting her ability to button.  Very fatigued,  Tremor Collapsed into sleep.  Can't keep concentrating at work.  She is forgetful, memory impaired,  Slight slurring of words,  Distractibility- no longer able to multi task.    ALLERGIES: Allergies  Allergen Reactions   Cephalosporins Itching and Swelling    Occurs with cephalexin (Keflex). Starts with prickling around the face, swelling/numb feeling of face.   Farxiga [Dapagliflozin] Shortness Of Breath    SOB and burning sensation in skin   Hydrochlorothiazide Itching and Swelling    Swelling of the face, hives, itching, feeling of bad sunburn.    Penicillins Itching and Swelling    Occurs with Augmentin and amoxicillin. Hives, prickling, swelling of face.    Sulfa Antibiotics Itching and Swelling    Itching, hives, swelling of face   Baclofen Other (See Comments)    Leg spasms, jittery, heart racing    Clonazepam     Males me mean   Lorazepam Other (See Comments)    "makes me real  short-tempered, mean"    HOME MEDICATIONS: Outpatient Medications Prior  to Visit  Medication Sig Dispense Refill   aspirin 81 MG tablet Take 1 tablet (81 mg total) by mouth daily.     atorvastatin (LIPITOR) 20 MG tablet Take 1 tablet (20 mg total) by mouth daily. 90 tablet 3   betamethasone valerate (VALISONE) 0.1 % cream Apply 1 application (a thin layer) topically to the affected area daily. 15 g 1   BETASERON 0.3 MG KIT injection INJECT 0.3 MG INTO THE SKIN EVERY OTHER DAY. 14 kit 11   cholecalciferol (VITAMIN D) 1000 UNITS tablet Take 1,000 Units by mouth daily.     Dulaglutide (TRULICITY) 4.5 GN/5.6OZ SOPN Inject 4.5 mg into the skin once a week. 2 mL 11   ferrous gluconate (FERGON) 324 MG tablet Take 324 mg by mouth daily with breakfast.     fluocinonide cream (LIDEX) 3.08 % Apply 1 application topically to affected area 2 (two) times daily. 30 g 1   hydrOXYzine (ATARAX) 10 MG tablet Take 1 tablet (10 mg total) by mouth 3 (three) times daily as needed. 90 tablet 1   lisinopril (PRINIVIL,ZESTRIL) 10 MG tablet Take 1 tablet (10 mg total) by mouth daily.     lisinopril (ZESTRIL) 10 MG tablet Take 1 tablet (10 mg total) by mouth daily. 90 tablet 3   metFORMIN (GLUCOPHAGE) 1000 MG tablet Take 1 tablet (1,000 mg total) by mouth 2 (two) times daily with a meal. 180 tablet 3   rOPINIRole (REQUIP) 2 MG tablet Take 1-2 tablets (2-4 mg total) by mouth at bedtime. 60 tablet 11   sertraline (ZOLOFT) 100 MG tablet Take 1 tablet (100 mg total) by mouth daily. 90 tablet 4   traMADol-acetaminophen (ULTRACET) 37.5-325 MG tablet Take 1 tablet by mouth 2 (two) times daily as needed. 60 tablet 5   vitamin B-12 (CYANOCOBALAMIN) 1000 MCG tablet Take 1,000 mcg by mouth daily.     dapagliflozin propanediol (FARXIGA) 5 MG TABS tablet Take 1 tablet (5 mg total) by mouth daily. 30 tablet 3   No facility-administered medications prior to visit.    PAST MEDICAL HISTORY: Past Medical History:  Diagnosis Date   Anemia    Diabetes mellitus    Hyperlipemia    Hypertension    Multiple  sclerosis (HCC)    RLS (restless legs syndrome)    Type 2 diabetes mellitus without (mention of) complications     PAST SURGICAL HISTORY: Past Surgical History:  Procedure Laterality Date   BONE CYST EXCISION     ESSURE TUBAL LIGATION     WISDOM TOOTH EXTRACTION  1985    FAMILY HISTORY: Family History  Problem Relation Age of Onset   Stroke Mother    Lupus Sister    Cancer Maternal Uncle        bone cancer    SOCIAL HISTORY: Social History   Socioeconomic History   Marital status: Single    Spouse name: Not on file   Number of children: 0   Years of education: Bachelor's   Highest education level: Not on file  Occupational History   Occupation: Psychiatrist: [employer]   Tobacco Use   Smoking status: Never   Smokeless tobacco: Never  Substance and Sexual Activity   Alcohol use: No   Drug use: No   Sexual activity: Not on file  Other Topics Concern   Not on file  Social History Narrative   Patient is single with no  children   Patient is left handed   Patient has a Water quality scientist degree   Patient drinks 20 oz daily   Social Determinants of Health   Financial Resource Strain: Not on file  Food Insecurity: Not on file  Transportation Needs: Not on file  Physical Activity: Not on file  Stress: Not on file  Social Connections: Not on file  Intimate Partner Violence: Not on file      PHYSICAL EXAM  Vitals:   07/28/22 1313  BP: (!) 157/92  Pulse: 78  Weight: 184 lb (83.5 kg)  Height: $Remove'5\' 5"'[id number]$  (1.651 m)   Body mass index is 30.62 kg/m.   Ankle edema, pale skin, low turgor.   Generalized: Well developed,  Neurological examination  Mentation: Alert oriented to time, place, history taking.  Follows all commands speech is fluent. Patient is in normal mood, not anxious ,not depressed.  Cranial nerve ,: no change in taste or smell.  Pupils were equal round reactive to light.  Extraocular movements were not full - she has left eye  adduction deficit , causing diplopia when fatigued.  Facial sensation and strength were normal.  Uvula and tongue midline.  Head turning and shoulder shrug were normal and symmetric. Motor: symmetric motor tone, normal ROM, no rigor.  Right hip flexion weaker than left, abduction and adduction are similar strength.  Unable to stand on tip toes, drifting to the right when walking.  Sensory: intact to soft touch, vibration and temperature on all 4 extremities. Coordination:   Gait and station:  stabile unassisted gait and 3 point turns, slight stooped posture- but ataxia with tandem gait and unable to perform toe or heel walking.  Drift to the right.   Reflexes: Deep tendon reflexes are symmetrically-elevated , 2 plus bilaterally- no clonus.   DIAGNOSTIC DATA (LABS, IMAGING, TESTING) - I reviewed patient records, labs, notes, testing and imaging myself where available.MRI brain, EEG, HST, Neuropsychology test, psychiatry interview.    ASSESSMENT AND PLAN; 07-28-2022  56 y.o. year old caucasian female with a long standing MS, relapsing- remitting-  history, vision changes, left abducens weakness,  impaired reading of numbers and letters, impaired balance, impaired ability to focus and multitask, right hip flexion weakness, lightheadedness and inability to focus. MS diagnosis is long established from 2006.  Patient is unable to return to her former place of employment, she is disabled.    Larey Seat, MD  07/28/2022, 1:22 PM [facility] [address], [place], [place] [address] [phone]

## 2022-07-28 NOTE — Progress Notes (Signed)
[name]    PATIENT: Erica Powell DOB: August 17, 1966  REASON FOR VISIT: follow up HISTORY FROM: patient here alone.  RV 07-28-2022: RV Erica Powell presents with concerns about her MS condition, Geraldine A. Charity had seen Dr Jefm Miles again in June 2023, for a meeting.  He supports her disability claims.    She  had frequent phone conversations with [place] since Summer 2022: in the meantime she has seen a psychiatrist- who found no evidence of mood disorder.  Zoloft 100 mg has not made a difference and she was willing to continue the medication-  She reports ongoing forgetfulness, forgets bills and check book balancing were off.  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  Biggest thing is her distractibility- she cannot multi task any longer.  All this affect her employability. 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 Her supervisor has clearly stated that her work performance was seriously impaired.    MRI : Her white matter changes in her brain are clearly not normal for age. We can go back to 03-2005 and compare images.   Abnormal MRI scan of the brain showing bilateral periventricular subcortical and pontine nonspecific white matter hyperintensities which are compatible with but not necessarily specific for multiple sclerosis and may also be seen in small vessel disease or vasculitic conditions.   No enhancing lesions are noted.  Remote age left basal ganglia cystic lesion is noted possibly remote age lacunar infarct.  No significant atrophy is noted.  Overall slight progression of white matter changes compared to previous MRI from January 2022.  There was no evidence of vasculitis.  MRI cervical  spine : 2020 Spinal cord: Minimal residual signal abnormality within the right cord at the C4-5 level and in the left cord at the C5-6 level. No new or active demyelinating  lesion. No abnormal contrast enhancement.   Posterior Fossa, vertebral arteries, paraspinal tissues: Visualized posterior fossa is normal. Vertebral artery flow voids are preserved. No prevertebral soft tissue swelling.   CSF was positive for oligoclonal bands in 2006. MS diagnosis.   There is reportedly , according to psychiatrist, no mood disorder present.    I support her long term disability claim.  The patent has not been abe to work since 09-2021, based on cognitive impairment, difficulties with reading letters and numbers, inability to multitask, unable to focus.  Physically limited by back pain, leg myoclonus, gait instability. Left eye abducens weakness.  The combination of cognitive and physical symptoms makes her employment impossible.     07-29-2021: I am seeing Erica Powell,  Today is a revisit more than 2 months after we last met but she has undergone he had COVID testing in the meantime.  I repeated a home sleep test with her which showed no apnea.  We repeated an MRI of the brain which showed no acute MS lesions her scan was still abnormal due to by lateral periventricular subcortical and pontine white matter hyperintensities.  These are usually seen with multiple sclerosis but this is not the only disease or process that could cause them.  We looked at a lacunar infarct which has been known from previous MRIs and in comparison to her MRI from January 2022  there has been only a slight progression of white matter disease.  Our concern is that it may be related to diabetes and her A1c she says is very well controlled.  She has remarkably no brain atrophy no migraines, and she is not hypertensive.  Her cervical spine MRI shows the same lesion for many years that was actually a diagnostic for MS.  She has  remained on betaseron. The patient's EEG from 6-13 2022 was normal iron studies with her primary care physician had revealed some iron deficiency anemia which is treated, underwent detailed testing the strong low number doctor of psychology and this took place in 2 sessions well the concern was that the patient has had overwhelming weakness, trembling experiences, word finding difficulties problems with focusing but also with fluently speaking.  And he found her thought process coherent her mood more dysphoric but her affect appropriate, the formal testing was performed last week on 8-9- 22.   She had extensive formal testing that day, full report is still to follow.  She is supposed to return to work tomorrow for a night shift.  I see her as depressed or frustrated by caregiver burden.        05-07-2021: URGENT WID-Rv for this MS patient with recent excessive fatigue, weakness spells, falls ! Presents to follow up with new problems , question of MS exacerbation. She recently had a surgical procedure M(ay 18th, dr Gertie Fey)  and noticed  since then she began to decline. 2 weeks ago she was walking down her steps and slipped on last 3 steps,  bruising her foot. 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 She got off work only to fall asleep in her car in the driveway, overslept by an hour. Couldn't finish her work day,  Her vision has changed, she was unable to keep the car straight in the lane.  Had an episode of slurred speech last Wednesday , a week ago, and her arms felt so heavy "like trembling".Now presenting for forgetfulness, cognitive impairment, unable to multitask, calculate or spell as she used to. Even her piano play has suffered, she has a delayed hand eye coordination, and she missed clues to coordinate her organ with the priest and choir.   She has been seen by psychiatrist, has been on Zoloft, increased from 50 to  100 mg and there is according to her report no improvement , psychiatrist reportedly did not feel a mood disorder to be present.  Her ability to function at work has been severely impaired, her supervisor was concerned about her decline in accuracy of calculation - and the severe problems this could have caused.  Brain MRI was showing white matter disease, slow progressive. She has positive oligoclonal bands- MS diagnosis is undisputed.  Remains on Betaseron , she was thus far  not interested in tysabri or MAB therapy.    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 The patient underwent an MRI of the brain on January 6 of this year the MRI showed hyperintense foci in the pons and in the hemispheres some of them are radially oriented in the form of Dawson's fingers, the ones in the pons had the appearance more consistent with microvascular changes than the demyelination.  She has a chronic lacune in the left external capsule this is most likely due to the risk factor of hypertension also patients with  diabetes and with migraines can develop lacunar infarct lacunar infarctions.  Nothing was acute on this last MRI that was compared to a study from April 2020. Chronic secondary progressive, MS- no longer relapsing remitting.     07-31-2020:The patient is here alone, well established 56 year -old MS patient . She states that overall things are well. She lost her companion in February and considers the Covid vaccine the cause of his death at age 53. She misses him daily and deeply. Has reported that she feels is like bone pain. She feels this on tops of arms and legs, is using the Requip and Ultracet.  She states that her RLS has been acting up. Last time ferritin level checked  08/16/19 and was 14.6- low. 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  She takes care of her mother in [place] ( who had a stroke)  and reports recently an increase in RLS and drop in ferritin( 9.9.2020 14.6) . She had a JCV panel in July 2020- negative.  We are discussing starting po iron versus iv- she has taken po iron and has not had a benefit. She reports pounding her legs in pain.     Rv from 06-12-2019, Erica Powell had undergone MRI imaging and we are meeting today to discuss the findings. MRI with slowly progressive demyelination - compared to studies from 2006 on. 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  Ms. 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  1 of her next visits will need to be associated with a Moca test. She has had no falls she can wear high heels, no problems with tandem gait/ heel -walk.  No changes in penmanship.  Reviewed medications there has been no change here she stays on ropinirole at bedtime, takes Betaseron every other day injection 0.3 mg. And iron supplements by mouth.   Patent has seen Erica Powell 3 times since my last visit with her, dated  06/28/2017: Kourtnee is returning today for a six-month revisit she had over the last 6 months taking an increased dose of Requip which has not helped her restless legs. She has some myoclonic jerking supple movements in both lower extremities, but mostly on the right. It starts as a painful spasm and then releases suddenly. It is very bothersome. The therapy works has not relieved the distress either. She has started dropping things more than usual, she may drop something right out of her  hands. She has sometimes double vision, when she gets fatigue her left eye abducts to the center creating a horizontal diplopia. She also states that she has some cognitive problems forgets what she is doing, sometimes she has to talk to her legs to move, the jerks have gotten worse sometimes it banged up against the desk or buckle and she is walking. In the morning however she is stiff as well as when she stands for long periods at a time or sitting for longer time  at the computer. She also has urinary problems. She sweats profusely when she mops, does homework housework physical work it is not like a hot flash. She wonders if it could be a mass. She states that she feels " pretty much like crap all the time not just the normal tiredness".   HISTORY (Davette Nugent): Erica Powell is a 56 y.o. left handed, caucasian female Is seen here as a routine once a year revisit for multiple sclerosis, originally referred from Dr. Laurann Montana.  Miss Osburn has been a patient in our neurology clinic for the last 8 years, beginning in 2006.   She was diagnosed with multiple sclerosis and treatment was initiated. She had no recent MS relapses but a brain MRI that had been performed in January 2014 was showing still scattered periventricular, subcortical and pontine T2 hyperintensities consistent with chronic demyelinating plaques of various age.   No acute lesions were seen in her last MRI obtained on 12/29/2012. She had 2 separate studies in 2006 at [place].  Initally she had C5 and C 6 lesions, these dorsal cervical cord lesions did not span multiple levels. The patient was diagnosed in 2012 with diabetes and has a strong DM family history.  Her last HbA1c just 2 month ago was 7.0 and had been in June 2014 at 8.3.  She noticed an improvement in her urinary frequency once her sugars were lower. She also has still no clinical progression or relapse, but she reports problems with concentration, cognition and sometimes with  multitasking. She feels that she is easily distracted and loses her train of thought when this happens. She feels especially distracted by noises at her work. 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 There's been no change in her employment, hours of work , hobbies or social life. She has a bachelor's degree ,is single but lives with her fianc.  She recently noted a new symptom of tightness in her chest , a paraspinal tenderness and tightness, a confined feeling surrounding the chest , a possible variation of " the MS hug", she has responded well to massage, but the relief is not sustained.    She is a 7[employer] day [employer].       REVIEW OF SYSTEMS: Out of a complete 14 system review of symptoms, the patient complains only of the following symptoms, and all other reviewed systems are negative.  She has a new onset tremble, low amplitude , affecting her ability to button.  Very fatigued,  Tremor Collapsed into sleep.  Can't keep concentrating at work.  She is forgetful, memory impaired,  Slight slurring of words,  Distractibility- no longer able to multi task.    ALLERGIES: Allergies  Allergen Reactions   Cephalosporins Itching and Swelling    Occurs with cephalexin (Keflex). Starts with prickling around the face, swelling/numb feeling of face.   Farxiga [Dapagliflozin] Shortness Of Breath    SOB and burning sensation in skin   Hydrochlorothiazide Itching and Swelling    Swelling of the face, hives, itching, feeling of bad sunburn.    Penicillins Itching and Swelling    Occurs with Augmentin and amoxicillin. Hives, prickling, swelling of face.    Sulfa Antibiotics Itching and Swelling    Itching, hives, swelling of face   Baclofen Other (See Comments)    Leg spasms, jittery, heart racing    Clonazepam     Males me mean   Lorazepam Other (See Comments)    "makes me real  short-tempered, mean"    HOME MEDICATIONS: Outpatient Medications Prior  to Visit  Medication Sig Dispense Refill   aspirin 81 MG tablet Take 1 tablet (81 mg total) by mouth daily.     atorvastatin (LIPITOR) 20 MG tablet Take 1 tablet (20 mg total) by mouth daily. 90 tablet 3   betamethasone valerate (VALISONE) 0.1 % cream Apply 1 application (a thin layer) topically to the affected area daily. 15 g 1   BETASERON 0.3 MG KIT injection INJECT 0.3 MG INTO THE SKIN EVERY OTHER DAY. 14 kit 11   cholecalciferol (VITAMIN D) 1000 UNITS tablet Take 1,000 Units by mouth daily.     Dulaglutide (TRULICITY) 4.5 VV/7.4MO SOPN Inject 4.5 mg into the skin once a week. 2 mL 11   ferrous gluconate (FERGON) 324 MG tablet Take 324 mg by mouth daily with breakfast.     fluocinonide cream (LIDEX) 7.07 % Apply 1 application topically to affected area 2 (two) times daily. 30 g 1   hydrOXYzine (ATARAX) 10 MG tablet Take 1 tablet (10 mg total) by mouth 3 (three) times daily as needed. 90 tablet 1   lisinopril (PRINIVIL,ZESTRIL) 10 MG tablet Take 1 tablet (10 mg total) by mouth daily.     lisinopril (ZESTRIL) 10 MG tablet Take 1 tablet (10 mg total) by mouth daily. 90 tablet 3   metFORMIN (GLUCOPHAGE) 1000 MG tablet Take 1 tablet (1,000 mg total) by mouth 2 (two) times daily with a meal. 180 tablet 3   rOPINIRole (REQUIP) 2 MG tablet Take 1-2 tablets (2-4 mg total) by mouth at bedtime. 60 tablet 11   sertraline (ZOLOFT) 100 MG tablet Take 1 tablet (100 mg total) by mouth daily. 90 tablet 4   traMADol-acetaminophen (ULTRACET) 37.5-325 MG tablet Take 1 tablet by mouth 2 (two) times daily as needed. 60 tablet 5   vitamin B-12 (CYANOCOBALAMIN) 1000 MCG tablet Take 1,000 mcg by mouth daily.     dapagliflozin propanediol (FARXIGA) 5 MG TABS tablet Take 1 tablet (5 mg total) by mouth daily. 30 tablet 3   No facility-administered medications prior to visit.    PAST MEDICAL HISTORY: Past Medical History:  Diagnosis Date   Anemia    Diabetes mellitus    Hyperlipemia    Hypertension    Multiple  sclerosis (HCC)    RLS (restless legs syndrome)    Type 2 diabetes mellitus without (mention of) complications     PAST SURGICAL HISTORY: Past Surgical History:  Procedure Laterality Date   BONE CYST EXCISION     ESSURE TUBAL LIGATION     WISDOM TOOTH EXTRACTION  1985    FAMILY HISTORY: Family History  Problem Relation Age of Onset   Stroke Mother    Lupus Sister    Cancer Maternal Uncle        bone cancer    SOCIAL HISTORY: Social History   Socioeconomic History   Marital status: Single    Spouse name: Not on file   Number of children: 0   Years of education: Bachelor's   Highest education level: Not on file  Occupational History   Occupation: Psychiatrist: [employer]   Tobacco Use   Smoking status: Never   Smokeless tobacco: Never  Substance and Sexual Activity   Alcohol use: No   Drug use: No   Sexual activity: Not on file  Other Topics Concern   Not on file  Social History Narrative   Patient is single with no  children   Patient is left handed   Patient has a Water quality scientist degree   Patient drinks 20 oz daily   Social Determinants of Health   Financial Resource Strain: Not on file  Food Insecurity: Not on file  Transportation Needs: Not on file  Physical Activity: Not on file  Stress: Not on file  Social Connections: Not on file  Intimate Partner Violence: Not on file      PHYSICAL EXAM  Vitals:   07/28/22 1313  BP: (!) 157/92  Pulse: 78  Weight: 184 lb (83.5 kg)  Height: _0  (1.651 m)   Body mass index is 30.62 kg/m.   Ankle edema, pale skin, low turgor.   Generalized: Well developed,  Neurological examination  Mentation: Alert oriented to time, place, history taking.  Follows all commands speech is fluent. Patient is in normal mood, not anxious ,not depressed.  Cranial nerve ,: no change in taste or smell.  Pupils were equal round reactive to light.  Extraocular movements were not full - she has left eye  adduction deficit , causing diplopia when fatigued.  Facial sensation and strength were normal.  Uvula and tongue midline.  Head turning and shoulder shrug were normal and symmetric. Motor: symmetric motor tone, normal ROM, no rigor.  Right hip flexion weaker than left, abduction and adduction are similar strength.  Unable to stand on tip toes, drifting to the right when walking.  Sensory: intact to soft touch, vibration and temperature on all 4 extremities. Coordination:   Gait and station:  stabile unassisted gait and 3 point turns, slight stooped posture- but ataxia with tandem gait and unable to perform toe or heel walking.  Drift to the right.   Reflexes: Deep tendon reflexes are symmetrically-elevated , 2 plus bilaterally- no clonus.   DIAGNOSTIC DATA (LABS, IMAGING, TESTING) - I reviewed patient records, labs, notes, testing and imaging myself where available.MRI brain, EEG, HST, Neuropsychology test, psychiatry interview.    ASSESSMENT AND PLAN; 07-28-2022  55 y.o. year old caucasian female with a long standing MS, relapsing- remitting-  history, vision changes, left abducens weakness,  impaired reading of numbers and letters, impaired balance, impaired ability to focus and multitask, right hip flexion weakness, lightheadedness and inability to focus. MS diagnosis is long established from 2006.   This Patient is unable to return to her former place of employment, she is disabled by physical and cognitive sequela of MS .     Larey Seat, MD  07/28/2022, 1:49 PM [facility] [address] [place], [place] [address] [phone]

## 2022-07-28 NOTE — Telephone Encounter (Signed)
Erica Powell, Erica Partridge, MD Neurology Follow-up ; Referred by Lavone Orn, MD Reason for Visit   Additional Documentation  Vitals:  BP 157/92 Important   Pulse 78 Ht _0  (1.651 m) Wt 184 lb (83.5 kg) BMI 30.62 kg/m BSA 1.96 m  More Vitals  Flowsheets:  Anthropometrics, NEWS, MEWS Score   Encounter Info:  Billing Info, History, Allergies, Detailed Report    All Notes   Progress Notes by Erica Seat, MD at 07/28/2022 1:30 PM  Author: Larey Seat, MD Author Type: Physician Filed: 07/28/2022  1:48 PM  Note Status: Sign when Signing Visit Cosign: Cosign Not Required Encounter Date: 07/28/2022  Editor: Erica Seat, MD (Physician)             .      PATIENT: Erica Powell DOB: July 04, 1966   REASON FOR VISIT: follow up HISTORY FROM: patient here alone.   RV 07-28-2022: RV Erica Powell presents with concerns about her MS condition, Erica Powell had seen Dr Erica Powell again in June 2023, for a meeting.  He supports her disability claims.     She  had frequent phone conversations with [place] since Summer 2022: in the meantime she has seen a psychiatrist- who found no evidence of mood disorder.  Zoloft 100 mg has not made a difference and she was willing to continue the medication-  She reports ongoing forgetfulness, forgets bills and check book balancing were off.  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  Biggest thing is her distractibility- she cannot multi task any longer.  All this affect her employability. 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 Her supervisor has clearly stated that her work performance was seriously impaired.     MRI : Her white matter  changes in her brain are clearly not normal for age. We can go back to 03-2005 and compare images.   Abnormal MRI scan of the brain showing bilateral periventricular subcortical and pontine nonspecific white matter hyperintensities which are compatible with but not necessarily specific for multiple sclerosis and may also be seen in small vessel disease or vasculitic conditions.   No enhancing lesions are noted.  Remote age left basal ganglia cystic lesion is noted possibly remote age lacunar infarct.  No significant atrophy is noted.  Overall slight progression of white matter changes compared to previous MRI from January 2022.   There was no evidence of vasculitis.   MRI cervical spine : 2020 Spinal cord: Minimal residual signal abnormality within the right cord at the C4-5 level and in the left cord at the C5-6 level. No new or active demyelinating  lesion. No abnormal contrast enhancement.   Posterior Fossa, vertebral arteries, paraspinal tissues: Visualized posterior fossa is normal. Vertebral artery flow voids are preserved. No prevertebral soft tissue swelling.     CSF was positive for oligoclonal bands in 2006. MS diagnosis.   There is reportedly , according to psychiatrist, no mood disorder present.    I support her long term disability claim.  The patent has not been abe to work since 09-2021, based on cognitive impairment, difficulties with reading letters and numbers, inability to multitask, unable to focus.  Physically limited by back pain, leg myoclonus, gait instability. Left  eye abducens weakness.  The combination of cognitive and physical symptoms makes her employment impossible.        07-29-2021: I am seeing Erica Powell,  Today is a revisit more than 2 months after we last met but she has undergone he had COVID testing in the meantime.  I repeated a home sleep test with her which showed no apnea.  We repeated an MRI of the brain which showed no acute MS lesions her  scan was still abnormal due to by lateral periventricular subcortical and pontine white matter hyperintensities.  These are usually seen with multiple sclerosis but this is not the only disease or process that could cause them.  We looked at a lacunar infarct which has been known from previous MRIs and in comparison to her MRI from January 2022 there has been only a slight progression of white matter disease.  Our concern is that it may be related to diabetes and her A1c she says is very well controlled.  She has remarkably no brain atrophy no migraines, and she is not hypertensive.  Her cervical spine MRI shows the same lesion for many years that was actually a diagnostic for MS.  She has remained on betaseron. The patient's EEG from 6-13 2022 was normal iron studies with her primary care physician had revealed some iron deficiency anemia which is treated, underwent detailed testing the strong low number doctor of psychology and this took place in 2 sessions well the concern was that the patient has had overwhelming weakness, trembling experiences, word finding difficulties problems with focusing but also with fluently speaking.  And he found her thought process coherent her mood more dysphoric but her affect appropriate, the formal testing was performed last week on 8-9- 22.   She had extensive formal testing that day, full report is still to follow.  She is supposed to return to work tomorrow for a night shift.  I see her as depressed or frustrated by caregiver burden.              05-07-2021: URGENT WID-Rv for this MS patient with recent excessive fatigue, weakness spells, falls ! Presents to follow up with new problems , question of MS exacerbation. She recently had a surgical procedure M(ay 18th, dr Gertie Fey)  and noticed  since then she began to decline. 2 weeks ago she was walking down her steps and slipped on last 3 steps,  bruising her foot. 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 She got off work only to fall asleep in her car in the driveway, overslept by an hour. Couldn't finish her work day,  Her vision has changed, she was unable to keep the car straight in the lane.  Had an episode of slurred speech last Wednesday , a week ago, and her arms felt so heavy "like trembling".Now presenting for forgetfulness, cognitive impairment, unable to multitask, calculate or spell as she used to. Even her piano play has suffered, she has a delayed hand eye coordination, and she missed clues to coordinate her organ with the priest and choir.   She has been seen by psychiatrist, has been on Zoloft, increased from 50 to 100 mg and there is according to her report no improvement , psychiatrist reportedly did not feel a mood disorder to be present.  Her ability to function at work has been severely impaired, her supervisor was concerned about her decline in  accuracy of calculation - and the severe problems this could have caused.  Brain MRI was showing white matter disease, slow progressive. She has positive oligoclonal bands- MS diagnosis is undisputed.  Remains on Betaseron , she was thus far  not interested in tysabri or MAB therapy.     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 The patient underwent an MRI of the brain on January 6 of this year the MRI showed hyperintense foci in the pons and in the hemispheres some of them are radially oriented in the form of Dawson's fingers, the ones in the pons had the appearance more consistent with microvascular changes than the demyelination.  She has a chronic lacune in the left external capsule this is most likely due to the risk factor of hypertension also patients with diabetes and with migraines can develop lacunar infarct lacunar infarctions.  Nothing was acute on this last MRI that was compared to a study from April 2020. Chronic secondary  progressive, MS- no longer relapsing remitting.       07-31-2020:The patient is here alone, well established 56 year -old MS patient . She states that overall things are well. She lost her companion in February and considers the Covid vaccine the cause of his death at age 62. She misses him daily and deeply. Has reported that she feels is like bone pain. She feels this on tops of arms and legs, is using the Requip and Ultracet.  She states that her RLS has been acting up. Last time ferritin level checked  08/16/19 and was 14.6- low. 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  She takes care of her mother in [place] ( who had a stroke)  and reports recently an increase in RLS and drop in ferritin( 9.9.2020 14.6) . She had a JCV panel in July 2020- negative.  We are discussing starting po iron versus iv- she has taken po iron and has not had a benefit. She reports pounding her legs in pain.       Rv from 06-12-2019, Chantal Partain had undergone MRI imaging and we are meeting today to discuss the findings. MRI with slowly progressive demyelination - compared to studies from 2006 on. 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    02-06-2019, RV with Chantal Peppel, a hospital pharmacy technician is seen in a RV.  Ms. 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  1 of her next visits will need to be associated with a [place] test. She has had no falls she can wear high heels, no problems with tandem gait/ heel -walk.  No changes in penmanship.   Reviewed medications there has  been no change here she stays on ropinirole at bedtime, takes Betaseron every other day injection 0.3 mg. And iron supplements by mouth.   Patent has seen [name] 3 times since my last visit with her, dated  06/28/2017: Leoma is returning today for a six-month revisit she had over the last 6 months  taking an increased dose of Requip which has not helped her restless legs. She has some myoclonic jerking supple movements in both lower extremities, but mostly on the right. It starts as a painful spasm and then releases suddenly. It is very bothersome. The therapy works has not relieved the distress either. She has started dropping things more than usual, she may drop something right out of her hands. She has sometimes double vision, when she gets fatigue her left eye abducts to the center creating a horizontal diplopia. She also states that she has some cognitive problems forgets what she is doing, sometimes she has to talk to her legs to move, the jerks have gotten worse sometimes it banged up against the desk or buckle and she is walking. In the morning however she is stiff as well as when she stands for long periods at a time or sitting for longer time at the computer. She also has urinary problems. She sweats profusely when she mops, does homework housework physical work it is not like a hot flash. She wonders if it could be a mass. She states that she feels " pretty much like crap all the time not just the normal tiredness".   HISTORY (Verble Styron): Clorine Wannamaker is a 56 y.o. left handed, caucasian female Is seen here as a routine once a year revisit for multiple sclerosis, originally referred from Dr. Laurann Montana.  Miss Siedlecki has been a patient in our neurology clinic for the last 8 years, beginning in 2006.   She was diagnosed with multiple sclerosis and treatment was initiated. She had no recent MS relapses but a brain MRI that had been performed in January 2014 was showing still scattered  periventricular, subcortical and pontine T2 hyperintensities consistent with chronic demyelinating plaques of various age.   No acute lesions were seen in her last MRI obtained on 12/29/2012. She had 2 separate studies in 2006 at [facility].  Initally she had C5 and C 6 lesions, these dorsal cervical cord lesions did not span multiple levels. The patient was diagnosed in 2012 with diabetes and has a strong DM family history.  Her last HbA1c just 2 month ago was 7.0 and had been in June 2014 at 8.3.  She noticed an improvement in her urinary frequency once her sugars were lower. She also has still no clinical progression or relapse, but she reports problems with concentration, cognition and sometimes with multitasking. She feels that she is easily distracted and loses her train of thought when this happens. She feels especially distracted by noises at her work. 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 There's been no change in her employment, hours of work , hobbies or social life. She has a bachelor's degree ,is single but lives with her fianc.  She recently noted a new symptom of tightness in her chest , a paraspinal tenderness and tightness, a confined feeling surrounding the chest , a possible variation of " the MS hug", she has responded well to massage, but the relief is not sustained.     She is a 7[employer] day [employer].        REVIEW OF SYSTEMS: Out of a complete 14 system review of symptoms, the patient complains only of the following symptoms, and all other reviewed systems are negative.   She has a new onset tremble, low amplitude , affecting her ability to button.  Very fatigued,  Tremor Collapsed into sleep.  Can't keep concentrating at work.  She is forgetful, memory impaired,  Slight slurring of words,  Distractibility- no longer able to multi task.     ALLERGIES:      Allergies  Allergen Reactions   Cephalosporins Itching and Swelling       Occurs with cephalexin (Keflex). Starts with prickling around the face, swelling/numb feeling of face.   Farxiga [Dapagliflozin] Shortness Of Breath      SOB and burning sensation in skin   Hydrochlorothiazide Itching and Swelling      Swelling of the face, hives, itching, feeling of bad sunburn.    Penicillins Itching and Swelling      Occurs with Augmentin and amoxicillin. Hives, prickling, swelling of face.    Sulfa Antibiotics Itching and Swelling      Itching, hives, swelling of face   Baclofen Other (See Comments)      Leg spasms, jittery, heart racing    Clonazepam        Males me mean   Lorazepam Other (See Comments)      "makes me real short-tempered, mean"      HOME MEDICATIONS:       Outpatient Medications Prior to Visit  Medication Sig Dispense Refill   aspirin 81 MG tablet Take 1 tablet (81 mg total) by mouth daily.       atorvastatin (LIPITOR) 20 MG tablet Take 1 tablet (20 mg total) by mouth daily. 90 tablet 3   betamethasone valerate (VALISONE) 0.1 % cream Apply 1 application (a thin layer) topically to the affected area daily. 15 g 1   BETASERON 0.3 MG KIT injection INJECT 0.3 MG INTO THE SKIN EVERY OTHER DAY. 14 kit 11   cholecalciferol (VITAMIN D) 1000 UNITS tablet Take 1,000 Units by mouth daily.       Dulaglutide (TRULICITY) 4.5 QD/8.2ME SOPN Inject 4.5 mg into the skin once a week. 2 mL 11   ferrous gluconate (FERGON) 324 MG tablet Take 324 mg by mouth daily with breakfast.       fluocinonide cream (LIDEX) 1.58 % Apply 1 application topically to affected area 2 (two) times daily. 30 g 1   hydrOXYzine (ATARAX) 10 MG tablet Take 1 tablet (10 mg total) by mouth 3 (three) times daily as needed. 90 tablet 1   lisinopril (PRINIVIL,ZESTRIL) 10 MG tablet Take 1 tablet (10 mg total) by mouth daily.       lisinopril (ZESTRIL) 10 MG tablet Take 1 tablet (10 mg total) by mouth daily. 90 tablet 3   metFORMIN (GLUCOPHAGE) 1000 MG tablet Take 1 tablet (1,000 mg total) by mouth  2 (two) times daily with a meal. 180 tablet 3   rOPINIRole (REQUIP) 2 MG tablet Take 1-2 tablets (2-4 mg total) by mouth at bedtime. 60 tablet 11   sertraline (ZOLOFT) 100 MG tablet Take 1 tablet (100 mg total) by mouth daily. 90 tablet 4   traMADol-acetaminophen (ULTRACET) 37.5-325 MG tablet Take 1 tablet by mouth 2 (two) times daily as needed. 60 tablet 5   vitamin B-12 (CYANOCOBALAMIN) 1000 MCG tablet Take 1,000 mcg by mouth daily.       dapagliflozin propanediol (FARXIGA) 5 MG TABS tablet Take 1 tablet (5 mg total) by mouth daily. 30 tablet 3    No facility-administered medications prior to visit.      PAST MEDICAL HISTORY:     Past Medical History:  Diagnosis Date   Anemia     Diabetes mellitus     Hyperlipemia     Hypertension  Multiple sclerosis (HCC)     RLS (restless legs syndrome)     Type 2 diabetes mellitus without (mention of) complications        PAST SURGICAL HISTORY:      Past Surgical History:  Procedure Laterality Date   BONE CYST EXCISION       ESSURE TUBAL LIGATION       WISDOM TOOTH EXTRACTION   1985      FAMILY HISTORY:      Family History  Problem Relation Age of Onset   Stroke Mother     Lupus Sister     Cancer Maternal Uncle          bone cancer      SOCIAL HISTORY: Social History         Socioeconomic History   Marital status: Single      Spouse name: Not on file   Number of children: 0   Years of education: Bachelor's   Highest education level: Not on file  Occupational History   Occupation: Engineering geologist: [employer]   Tobacco Use   Smoking status: Never   Smokeless tobacco: Never  Substance and Sexual Activity   Alcohol use: No   Drug use: No   Sexual activity: Not on file  Other Topics Concern   Not on file  Social History Narrative    Patient is single with no children    Patient is left handed    Patient has a Water quality scientist degree    Patient drinks 20 oz daily    Social Determinants of Health     Financial Resource Strain: Not on file  Food Insecurity: Not on file  Transportation Needs: Not on file  Physical Activity: Not on file  Stress: Not on file  Social Connections: Not on file  Intimate Partner Violence: Not on file          PHYSICAL EXAM      Vitals:    07/28/22 1313  BP: (!) 157/92  Pulse: 78  Weight: 184 lb (83.5 kg)  Height: _0  (1.651 m)    Body mass index is 30.62 kg/m.    Ankle edema, pale skin, low turgor.    Generalized: Well developed,  Neurological examination  Mentation: Alert oriented to time, place, history taking.  Follows all commands speech is fluent. Patient is in normal mood, not anxious ,not depressed.  Cranial nerve ,: no change in taste or smell.  Pupils were equal round reactive to light.  Extraocular movements were not full - she has left eye adduction deficit , causing diplopia when fatigued.  Facial sensation and strength were normal.  Uvula and tongue midline.  Head turning and shoulder shrug were normal and symmetric. Motor: symmetric motor tone, normal ROM, no rigor.  Right hip flexion weaker than left, abduction and adduction are similar strength.  Unable to stand on tip toes, drifting to the right when walking.  Sensory: intact to soft touch, vibration and temperature on all 4 extremities. Coordination:   Gait and station:  stabile unassisted gait and 3 point turns, slight stooped posture- but ataxia with tandem gait and unable to perform toe or heel walking.  Drift to the right.   Reflexes: Deep tendon reflexes are symmetrically-elevated , 2 plus bilaterally- no clonus.    DIAGNOSTIC DATA (LABS, IMAGING, TESTING) - I reviewed patient records, labs, notes, testing and imaging myself where available.MRI brain, EEG, HST, Neuropsychology test, psychiatry interview.  ASSESSMENT AND PLAN; 07-28-2022   56 y.o. year old caucasian female with a long standing MS, relapsing- remitting-  history, vision changes, left abducens  weakness,  impaired reading of numbers and letters, impaired balance, impaired ability to focus and multitask, right hip flexion weakness, lightheadedness and inability to focus. MS diagnosis is long established from 2006.  Patient is unable to return to her former place of employment, she is disabled.      Erica Seat, MD   07/28/2022, 1:22 PM [facility] [address] [place], [place] [address] [phone]

## 2022-07-31 ENCOUNTER — Other Ambulatory Visit (HOSPITAL_COMMUNITY): Payer: Self-pay

## 2022-08-14 ENCOUNTER — Other Ambulatory Visit (HOSPITAL_COMMUNITY): Payer: Self-pay

## 2022-08-14 MED ORDER — LATANOPROST 0.005 % OP SOLN
1.0000 [drp] | Freq: Every day | OPHTHALMIC | 2 refills | Status: AC
  Filled 2022-08-14: qty 2.5, 25d supply, fill #0
  Filled 2022-09-07: qty 2.5, 25d supply, fill #1
  Filled 2022-10-03: qty 2.5, 25d supply, fill #2
  Filled 2022-11-06: qty 2.5, 25d supply, fill #3
  Filled 2022-11-29: qty 2.5, 25d supply, fill #4
  Filled 2022-12-26: qty 2.5, 25d supply, fill #5
  Filled 2023-01-26: qty 2.5, 25d supply, fill #6
  Filled 2023-02-26: qty 2.5, 25d supply, fill #7
  Filled 2023-03-27: qty 2.5, 25d supply, fill #8

## 2022-08-14 MED ORDER — BETAMETHASONE VALERATE 0.1 % EX CREA
1.0000 | TOPICAL_CREAM | Freq: Every day | CUTANEOUS | 1 refills | Status: DC
  Filled 2022-08-14: qty 15, 30d supply, fill #0
  Filled 2022-12-06: qty 15, 30d supply, fill #1

## 2022-08-14 MED ORDER — ATORVASTATIN CALCIUM 20 MG PO TABS
20.0000 mg | ORAL_TABLET | Freq: Every day | ORAL | 3 refills | Status: DC
  Filled 2022-08-14: qty 30, 30d supply, fill #0
  Filled 2022-09-25: qty 30, 30d supply, fill #1
  Filled 2022-11-06: qty 30, 30d supply, fill #2
  Filled 2022-12-06: qty 30, 30d supply, fill #3
  Filled 2023-01-05: qty 30, 30d supply, fill #4
  Filled 2023-02-10: qty 30, 30d supply, fill #5
  Filled 2023-03-15: qty 30, 30d supply, fill #6
  Filled 2023-04-12: qty 30, 30d supply, fill #7
  Filled 2023-05-10: qty 30, 30d supply, fill #8
  Filled 2023-06-09: qty 30, 30d supply, fill #9
  Filled 2023-07-13: qty 30, 30d supply, fill #10

## 2022-08-17 ENCOUNTER — Other Ambulatory Visit (HOSPITAL_COMMUNITY): Payer: Self-pay

## 2022-08-18 ENCOUNTER — Other Ambulatory Visit (HOSPITAL_COMMUNITY): Payer: Self-pay

## 2022-08-24 ENCOUNTER — Other Ambulatory Visit (HOSPITAL_COMMUNITY): Payer: Self-pay

## 2022-08-25 ENCOUNTER — Encounter: Payer: Self-pay | Admitting: Family Medicine

## 2022-08-26 ENCOUNTER — Other Ambulatory Visit: Payer: Self-pay | Admitting: *Deleted

## 2022-08-28 ENCOUNTER — Other Ambulatory Visit (HOSPITAL_COMMUNITY): Payer: Self-pay

## 2022-09-01 ENCOUNTER — Other Ambulatory Visit (HOSPITAL_COMMUNITY): Payer: Self-pay

## 2022-09-01 ENCOUNTER — Encounter: Payer: Self-pay | Admitting: Family Medicine

## 2022-09-01 DIAGNOSIS — Z0271 Encounter for disability determination: Secondary | ICD-10-CM

## 2022-09-01 MED ORDER — TRESIBA FLEXTOUCH 100 UNIT/ML ~~LOC~~ SOPN
1.0000 [IU] | PEN_INJECTOR | Freq: Every day | SUBCUTANEOUS | 1 refills | Status: DC
  Filled 2022-09-01: qty 9, 900d supply, fill #0

## 2022-09-01 MED ORDER — TRESIBA FLEXTOUCH 100 UNIT/ML ~~LOC~~ SOPN
10.0000 [IU] | PEN_INJECTOR | Freq: Every day | SUBCUTANEOUS | 1 refills | Status: DC
  Filled 2022-09-01: qty 9, 90d supply, fill #0

## 2022-09-02 ENCOUNTER — Other Ambulatory Visit (HOSPITAL_COMMUNITY): Payer: Self-pay

## 2022-09-02 MED ORDER — METFORMIN HCL 1000 MG PO TABS
1000.0000 mg | ORAL_TABLET | Freq: Two times a day (BID) | ORAL | 3 refills | Status: DC
  Filled 2022-09-02: qty 60, 30d supply, fill #0
  Filled 2022-10-03: qty 60, 30d supply, fill #1
  Filled 2022-11-06: qty 60, 30d supply, fill #2
  Filled 2022-12-06: qty 60, 30d supply, fill #3
  Filled 2023-01-05: qty 60, 30d supply, fill #4
  Filled 2023-02-10: qty 60, 30d supply, fill #5
  Filled 2023-03-15: qty 60, 30d supply, fill #6
  Filled 2023-04-12: qty 60, 30d supply, fill #7
  Filled 2023-05-10: qty 60, 30d supply, fill #8
  Filled 2023-06-09: qty 60, 30d supply, fill #9
  Filled 2023-07-13: qty 60, 30d supply, fill #10
  Filled 2023-08-29: qty 60, 30d supply, fill #11

## 2022-09-04 ENCOUNTER — Other Ambulatory Visit (HOSPITAL_COMMUNITY): Payer: Self-pay

## 2022-09-08 ENCOUNTER — Other Ambulatory Visit (HOSPITAL_COMMUNITY): Payer: Self-pay

## 2022-09-08 MED ORDER — BASAGLAR KWIKPEN 100 UNIT/ML ~~LOC~~ SOPN
10.0000 [IU] | PEN_INJECTOR | Freq: Every evening | SUBCUTANEOUS | 1 refills | Status: DC
  Filled 2022-09-08: qty 3, 30d supply, fill #0
  Filled 2022-10-03: qty 3, 30d supply, fill #1
  Filled 2022-11-06: qty 3, 30d supply, fill #2
  Filled 2022-12-06: qty 3, 30d supply, fill #3
  Filled 2023-01-05: qty 3, 30d supply, fill #4
  Filled 2023-02-10: qty 3, 30d supply, fill #5

## 2022-09-10 ENCOUNTER — Other Ambulatory Visit (HOSPITAL_COMMUNITY): Payer: Self-pay

## 2022-09-10 MED ORDER — INSULIN PEN NEEDLE 32G X 4 MM MISC
1 refills | Status: DC
  Filled 2022-09-10: qty 100, 30d supply, fill #0
  Filled 2022-10-06 – 2022-10-14 (×2): qty 100, 30d supply, fill #1

## 2022-09-14 ENCOUNTER — Other Ambulatory Visit (HOSPITAL_COMMUNITY): Payer: Self-pay

## 2022-09-16 ENCOUNTER — Other Ambulatory Visit (HOSPITAL_COMMUNITY): Payer: Self-pay

## 2022-09-17 ENCOUNTER — Telehealth: Payer: Self-pay | Admitting: Neurology

## 2022-09-17 NOTE — Telephone Encounter (Signed)
Stacey from Dr. Alexia Freestone office called wanting to schedule a peer to peer regarding pt medication.

## 2022-09-18 ENCOUNTER — Other Ambulatory Visit (HOSPITAL_COMMUNITY): Payer: Self-pay

## 2022-09-18 MED ORDER — FLUOCINONIDE 0.05 % EX CREA
1.0000 | TOPICAL_CREAM | Freq: Two times a day (BID) | CUTANEOUS | 0 refills | Status: AC
  Filled 2022-09-18: qty 30, 30d supply, fill #0

## 2022-09-22 NOTE — Telephone Encounter (Signed)
I called Dr. Alba Cory office today in [place], at 16.38 hours [place] time.   It is unclear to me what function dr Alba Cory office has. [place] ?    I only reached a voicemail. Again left my name, patient's name and DOB and our phone number.    Larey Seat, MD

## 2022-09-25 ENCOUNTER — Other Ambulatory Visit (HOSPITAL_COMMUNITY): Payer: Self-pay

## 2022-09-25 MED ORDER — CYCLOSPORINE 0.05 % OP EMUL
1.0000 [drp] | Freq: Two times a day (BID) | OPHTHALMIC | 3 refills | Status: AC
  Filled 2022-09-25 – 2022-11-06 (×6): qty 180, 90d supply, fill #0
  Filled 2022-11-09: qty 60, 30d supply, fill #0
  Filled 2022-11-09 (×3): qty 180, 90d supply, fill #0
  Filled 2022-11-10 (×2): qty 60, 30d supply, fill #0
  Filled 2022-12-26 – 2023-01-26 (×3): qty 60, 30d supply, fill #1
  Filled 2023-01-28: qty 60, 30d supply, fill #2
  Filled 2023-01-28 (×3): qty 60, 30d supply, fill #1
  Filled 2023-02-26: qty 60, 30d supply, fill #2
  Filled 2023-03-27: qty 60, 30d supply, fill #3
  Filled 2023-05-04: qty 60, 30d supply, fill #4
  Filled 2023-06-02: qty 60, 30d supply, fill #5
  Filled 2023-07-13: qty 60, 30d supply, fill #6
  Filled 2023-08-29: qty 60, 30d supply, fill #7

## 2022-09-28 ENCOUNTER — Other Ambulatory Visit (HOSPITAL_COMMUNITY): Payer: Self-pay

## 2022-09-29 ENCOUNTER — Other Ambulatory Visit (HOSPITAL_COMMUNITY): Payer: Self-pay

## 2022-10-02 DIAGNOSIS — M1990 Unspecified osteoarthritis, unspecified site: Secondary | ICD-10-CM | POA: Diagnosis not present

## 2022-10-02 DIAGNOSIS — E785 Hyperlipidemia, unspecified: Secondary | ICD-10-CM | POA: Diagnosis not present

## 2022-10-02 DIAGNOSIS — E1165 Type 2 diabetes mellitus with hyperglycemia: Secondary | ICD-10-CM | POA: Diagnosis not present

## 2022-10-02 DIAGNOSIS — I10 Essential (primary) hypertension: Secondary | ICD-10-CM | POA: Diagnosis not present

## 2022-10-02 DIAGNOSIS — L299 Pruritus, unspecified: Secondary | ICD-10-CM | POA: Diagnosis not present

## 2022-10-02 DIAGNOSIS — Z7984 Long term (current) use of oral hypoglycemic drugs: Secondary | ICD-10-CM | POA: Diagnosis not present

## 2022-10-02 DIAGNOSIS — G2581 Restless legs syndrome: Secondary | ICD-10-CM | POA: Diagnosis not present

## 2022-10-02 DIAGNOSIS — Z794 Long term (current) use of insulin: Secondary | ICD-10-CM | POA: Diagnosis not present

## 2022-10-02 DIAGNOSIS — G35 Multiple sclerosis: Secondary | ICD-10-CM | POA: Diagnosis not present

## 2022-10-02 DIAGNOSIS — R32 Unspecified urinary incontinence: Secondary | ICD-10-CM | POA: Diagnosis not present

## 2022-10-02 DIAGNOSIS — R69 Illness, unspecified: Secondary | ICD-10-CM | POA: Diagnosis not present

## 2022-10-03 ENCOUNTER — Other Ambulatory Visit (HOSPITAL_COMMUNITY): Payer: Self-pay

## 2022-10-05 ENCOUNTER — Other Ambulatory Visit (HOSPITAL_COMMUNITY): Payer: Self-pay

## 2022-10-06 ENCOUNTER — Other Ambulatory Visit (HOSPITAL_COMMUNITY): Payer: Self-pay

## 2022-10-14 ENCOUNTER — Other Ambulatory Visit (HOSPITAL_COMMUNITY): Payer: Self-pay

## 2022-10-21 ENCOUNTER — Encounter: Payer: Self-pay | Admitting: Neurology

## 2022-11-06 ENCOUNTER — Other Ambulatory Visit (HOSPITAL_COMMUNITY): Payer: Self-pay

## 2022-11-09 ENCOUNTER — Other Ambulatory Visit (HOSPITAL_COMMUNITY): Payer: Self-pay

## 2022-11-10 ENCOUNTER — Other Ambulatory Visit (HOSPITAL_COMMUNITY): Payer: Self-pay

## 2022-11-11 ENCOUNTER — Other Ambulatory Visit (HOSPITAL_COMMUNITY): Payer: Self-pay

## 2022-11-12 ENCOUNTER — Other Ambulatory Visit (HOSPITAL_COMMUNITY): Payer: Self-pay

## 2022-11-24 DIAGNOSIS — Z0271 Encounter for disability determination: Secondary | ICD-10-CM

## 2022-12-01 ENCOUNTER — Other Ambulatory Visit (HOSPITAL_COMMUNITY): Payer: Self-pay

## 2022-12-03 ENCOUNTER — Encounter: Payer: Self-pay | Admitting: Family Medicine

## 2022-12-03 ENCOUNTER — Other Ambulatory Visit (HOSPITAL_COMMUNITY): Payer: Self-pay

## 2022-12-17 ENCOUNTER — Encounter: Payer: Self-pay | Admitting: Neurology

## 2022-12-24 NOTE — Progress Notes (Unsigned)
No chief complaint on file.   HISTORY OF PRESENT ILLNESS:  12/24/22 ALL:  Erica Powell is a 57 y.o. female here today for follow up for MS. She continues Betaseron. MRI 05/2021 did not show any acute changes.   She feels that she is doing well from an MS standpoint. She has occasional blurred vision. Gait is stable. When she first wakes in the morning she feels a little off balance. She does not use a cane or walker. She reports falling about 2 months ago. She was leaving church and fell on uneven pavement. She reports being evaluated by a doctor friend and was told she was ok. She is sleeping well at this time. She had some days where she would be up for 27 hours but feels this was related to stress. She has moved to a house with her boyfriend and feels financial stressors have improved. She continues ropinirole 4mg  at bedtime for RLS. Ultracet helps with breakthrough pain. She may take with ropinirole if RLS is acting up.   She continues to have difficulty with cognitive impairment. Neurocognitive eval with Dr Sima Matas showed deficits with motor control, changes in visual-spatial and visual analysis capacity, visual encoding deficits  as well as significant changes in visual memory and learning. Findings consistent with changes due to MS and vascular changes. Mood and quality of sleep also contributing. She is in the process of applying for disability. She tells me that Dr Sima Matas told her she can not return to work until "at least" 09/2022. She states that she is unable to return to work due to continued cognitive deficits of irritability and inability to focus. She is scheduled to return 02/11/2022. She continues sertraline 100mg  daily (PCP) and hydroxyzine 10mg  TID PRN. She has been seen by psychiatry in the past but not recently.   She continues to see Dr Laurann Montana regularly. Last A1C was >7. Trulicity was increased to 4.5mg . She has reportedly lost about 27 pounds over the past year with  healthy lifestyle habits.   HISTORY (copied from Dr Dohmeier's previous note)  Erica 07-28-2022: Erica Powell presents with concerns about her MS condition, Erica Powell had seen Dr Jefm Miles again in June 2023, for a meeting.  He supports her disability claims.     She  had frequent phone conversations with [place] since Summer 2022: in the meantime she has seen a psychiatrist- who found no evidence of mood disorder.  Zoloft 100 mg has not made a difference and she was willing to continue the medication-  She reports ongoing forgetfulness, forgets bills and check book balancing were off.  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  Biggest thing is her distractibility- she cannot multi task any longer.  All this affect her employability. 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 Her supervisor has clearly stated that her work performance was seriously impaired.     MRI : Her white matter changes in her brain are clearly not normal for age. We can go back to 03-2005 and compare images.   Abnormal MRI scan of the brain showing bilateral periventricular subcortical and pontine nonspecific white matter hyperintensities which are compatible with but not necessarily specific for multiple  sclerosis and may also be seen in small vessel disease or vasculitic conditions.   No enhancing lesions are noted.  Remote age left basal ganglia cystic lesion is noted possibly remote age lacunar infarct.  No significant atrophy is noted.  Overall slight progression of white matter changes compared to previous MRI from January 2022.   There was no evidence of vasculitis.   MRI cervical spine : 2020 Spinal cord:  Minimal residual signal abnormality within the right cord at the C4-5 level and in the left cord at the C5-6 level. No new or active demyelinating  lesion. No abnormal contrast enhancement.   Posterior Fossa, vertebral arteries, paraspinal tissues: Visualized posterior fossa is normal. Vertebral artery flow voids are preserved. No prevertebral soft tissue swelling.     CSF was positive for oligoclonal bands in 2006. MS diagnosis.   There is reportedly , according to psychiatrist, no mood disorder present.    I support her long term disability claim.  The patent has not been abe to work since 09-2021, based on cognitive impairment, difficulties with reading letters and numbers, inability to multitask, unable to focus.  Physically limited by back pain, leg myoclonus, gait instability. Left eye abducens weakness.  The combination of cognitive and physical symptoms makes her employment impossible.      REVIEW OF SYSTEMS: Out of a complete 14 system review of symptoms, the patient complains only of the following symptoms, irritability, inattention, depression, and all other reviewed systems are negative.   ALLERGIES: Allergies  Allergen Reactions   Cephalosporins Itching and Swelling    Occurs with cephalexin (Keflex). Starts with prickling around the face, swelling/numb feeling of face.   Farxiga [Dapagliflozin] Shortness Of Breath    SOB and burning sensation in skin   Hydrochlorothiazide Itching and Swelling    Swelling of the face, hives, itching, feeling of bad sunburn.    Penicillins Itching and Swelling    Occurs with Augmentin and amoxicillin. Hives, prickling, swelling of face.    Sulfa Antibiotics Itching and Swelling    Itching, hives, swelling of face   Baclofen Other (See Comments)    Leg spasms, jittery, heart racing    Tramadol-Acetaminophen     itchy   Clonazepam     Males me mean   Gabapentin     shaky hands picking at my chest, seeing things like a bug go across  the living room floor   Lorazepam Other (See Comments)    "makes me real short-tempered, mean"     HOME MEDICATIONS: Outpatient Medications Prior to Visit  Medication Sig Dispense Refill   aspirin 81 MG tablet Take 1 tablet (81 mg total) by mouth daily.     atorvastatin (LIPITOR) 20 MG tablet Take 1 tablet (20 mg total) by mouth daily. 90 tablet 3   betamethasone valerate (VALISONE) 0.1 % cream Apply 1 application  (a thin layer) topically to affected area daily. 15 g 1   BETASERON 0.3 MG KIT injection INJECT 0.3 MG INTO THE SKIN EVERY OTHER DAY. 14 kit 11   cholecalciferol (VITAMIN D) 1000 UNITS tablet Take 1,000 Units by mouth daily.     cycloSPORINE (RESTASIS) 0.05 % ophthalmic emulsion Place 1 drop into both eyes 2 (two) times daily. 180 mL 3   Dulaglutide (TRULICITY) 4.5 MV/7.8IO SOPN Inject 4.5 mg into the skin once a week. 2 mL 11   ferrous gluconate (FERGON) 324 MG tablet Take 324 mg by mouth daily with breakfast.     fluocinonide cream (  LIDEX) 0.05 % Apply 1 Application topically to affected area 2 (two) times daily. 30 g 0   hydrOXYzine (ATARAX) 10 MG tablet Take 1 tablet (10 mg total) by mouth 3 (three) times daily as needed. 90 tablet 1   insulin degludec (TRESIBA FLEXTOUCH) 100 UNIT/ML FlexTouch Pen Inject 10 Units into the skin at bedtime. 9 mL 1   Insulin Glargine (BASAGLAR KWIKPEN) 100 UNIT/ML Inject 10 Units into the skin every evening. 9 mL 1   Insulin Pen Needle 32G X 4 MM MISC Use as directed once a day 100 each 1   latanoprost (XALATAN) 0.005 % ophthalmic solution Place 1 drop into both eyes at bedtime. 7.5 mL 2   lisinopril (PRINIVIL,ZESTRIL) 10 MG tablet Take 1 tablet (10 mg total) by mouth daily.     lisinopril (ZESTRIL) 10 MG tablet Take 1 tablet (10 mg total) by mouth daily. 90 tablet 3   metFORMIN (GLUCOPHAGE) 1000 MG tablet Take 1 tablet (1,000 mg total) by mouth 2 (two) times daily with a meal. 180 tablet 3   rOPINIRole (REQUIP) 2 MG tablet Take 1-2 tablets (2-4  mg total) by mouth at bedtime. 60 tablet 11   sertraline (ZOLOFT) 100 MG tablet Take 1 tablet (100 mg total) by mouth daily. 90 tablet 4   vitamin B-12 (CYANOCOBALAMIN) 1000 MCG tablet Take 1,000 mcg by mouth daily.     No facility-administered medications prior to visit.     PAST MEDICAL HISTORY: Past Medical History:  Diagnosis Date   Anemia    Diabetes mellitus    Hyperlipemia    Hypertension    Multiple sclerosis (HCC)    RLS (restless legs syndrome)    Type 2 diabetes mellitus without (mention of) complications      PAST SURGICAL HISTORY: Past Surgical History:  Procedure Laterality Date   BONE CYST EXCISION     ESSURE TUBAL LIGATION     WISDOM TOOTH EXTRACTION  1985     FAMILY HISTORY: Family History  Problem Relation Age of Onset   Stroke Mother    Lupus Sister    Cancer Maternal Uncle        bone cancer     SOCIAL HISTORY: Social History   Socioeconomic History   Marital status: Single    Spouse name: Not on file   Number of children: 0   Years of education: Bachelor's   Highest education level: Not on file  Occupational History   Occupation: Physiological scientist: [employer]   Tobacco Use   Smoking status: Never   Smokeless tobacco: Never  Substance and Sexual Activity   Alcohol use: No   Drug use: No   Sexual activity: Not on file  Other Topics Concern   Not on file  Social History Narrative   Patient is single with no children   Patient is left handed   Patient has a Energy manager degree   Patient drinks 20 oz daily   Social Determinants of Health   Financial Resource Strain: Not on file  Food Insecurity: Not on file  Transportation Needs: Not on file  Physical Activity: Not on file  Stress: Not on file  Social Connections: Not on file  Intimate Partner Violence: Not on file     PHYSICAL EXAM  There were no vitals filed for this visit.  There is no height or weight on file to calculate BMI.  Generalized: Well  developed, in no acute distress  Cardiology: normal rate and rhythm,  no murmur auscultated  Respiratory: clear to auscultation bilaterally    Neurological examination  Mentation: Alert oriented to time, place, history taking. Follows all commands speech and language fluent Cranial nerve II-XII: Pupils were equal round reactive to light. Extraocular movements were full, visual field were full on confrontational test. Facial sensation and strength were normal. Uvula tongue midline. Head turning and shoulder shrug  were normal and symmetric. Motor: The motor testing reveals 5 over 5 strength of all 4 extremities. Good symmetric motor tone is noted throughout.  Sensory: Sensory testing is intact to soft touch on all 4 extremities. No evidence of extinction is noted.  Coordination: Cerebellar testing reveals good finger-nose-finger and heel-to-shin bilaterally.  Gait and station: Gait is normal.  Reflexes: Deep tendon reflexes are symmetric and normal bilaterally.    DIAGNOSTIC DATA (LABS, IMAGING, TESTING) - I reviewed patient records, labs, notes, testing and imaging myself where available.  Lab Results  Component Value Date   WBC 7.2 08/03/2018   HGB 11.0 (L) 08/03/2018   HCT 34.0 08/03/2018   MCV 89 08/03/2018   PLT 329 08/03/2018      Component Value Date/Time   NA 139 02/06/2019 1132   K 4.4 02/06/2019 1132   CL 103 02/06/2019 1132   CO2 20 02/06/2019 1132   GLUCOSE 102 (H) 02/06/2019 1132   BUN 8 02/06/2019 1132   CREATININE 0.60 02/06/2019 1132   CALCIUM 9.4 02/06/2019 1132   PROT 7.7 05/07/2021 1631   ALBUMIN 4.2 02/06/2019 1132   AST 18 02/06/2019 1132   ALT 15 02/06/2019 1132   ALKPHOS 68 02/06/2019 1132   BILITOT <0.2 02/06/2019 1132   GFRNONAA 105 02/06/2019 1132   GFRAA 121 02/06/2019 1132   No results found for: "CHOL", "HDL", "LDLCALC", "LDLDIRECT", "TRIG", "CHOLHDL" No results found for: "HGBA1C" No results found for: "VITAMINB12" No results found for:  "TSH"      No data to display              03/30/2022   10:33 AM  Montreal Cognitive Assessment   Visuospatial/ Executive (0/5) 4  Naming (0/3) 3  Attention: Read list of digits (0/2) 2  Attention: Read list of letters (0/1) 1  Attention: Serial 7 subtraction starting at 100 (0/3) 3  Language: Repeat phrase (0/2) 2  Language : Fluency (0/1) 1  Abstraction (0/2) 2  Delayed Recall (0/5) 3  Orientation (0/6) 6  Total 27  Adjusted Score (based on education) 74     ASSESSMENT AND PLAN  57 y.o. year old female  has a past medical history of Anemia, Diabetes mellitus, Hyperlipemia, Hypertension, Multiple sclerosis (HCC), RLS (restless legs syndrome), and Type 2 diabetes mellitus without (mention of) complications. here with    No diagnosis found.  Olivene is doing well, physically. She will continue Betaseron as directed. Labs recently completed with PCP and reportedly normal. RLS is stable on ropinirole 4mg  at bedtime. She will continue. May use Ultracet sparingly for breakthrough pain. We have had a lengthy discussion regarding mood and ability to return to work. She does not feel that she is able to return to previous position as a . She is applying for social security disability. I have encouraged her to consider formal evaluation with psychiatry to help with stabilization of anger, irritability and evaluation of attention deficit. She declines at this time. I will send her notes to Dr Associate Professor to review and determine appropriateness for return to work. She was commended for her weight  loss efforts.  She will continue close follow up with PCP for co morbidity management. She will follow up with Dr Vickey Huger for MS follow up in 6 months. She verbalizes understanding and agreement with this plan.    No orders of the defined types were placed in this encounter.    No orders of the defined types were placed in this encounter.   I spent 30 minutes of face-to-face and  non-face-to-face time with patient.  This included previsit chart review, lab review, study review, order entry, electronic health record documentation, patient education.    Shawnie Dapper, MSN, FNP-C 12/24/2022, 4:27 PM  [facility] [address], [address] [place], [place] [address] [phone]

## 2022-12-24 NOTE — Patient Instructions (Incomplete)
Below is our plan:  We will continue current plan. Consider updating MRI in the next year. Will discuss with Dr Vickey Huger. I will give you 10 Ultracet tablets to help with breakthrough pain. Use sparingly. Monitor for worsening skin itching.   Please make sure you are staying well hydrated. I recommend 50-60 ounces daily. Well balanced diet and regular exercise encouraged. Consistent sleep schedule with 6-8 hours recommended.   Please continue follow up with care team as directed.   Follow up with Dr Vickey Huger in 6-8 months   You may receive a survey regarding today's visit. I encourage you to leave honest feed back as I do use this information to improve patient care. Thank you for seeing me today!   Management of Memory Problems   There are some general things you can do to help manage your memory problems.  Your memory may not in fact recover, but by using techniques and strategies you will be able to manage your memory difficulties better.   1)  Establish a routine. Try to establish and then stick to a regular routine.  By doing this, you will get used to what to expect and you will reduce the need to rely on your memory.  Also, try to do things at the same time of day, such as taking your medication or checking your calendar first thing in the morning. Think about think that you can do as a part of a regular routine and make a list.  Then enter them into a daily planner to remind you.  This will help you establish a routine.   2)  Organize your environment. Organize your environment so that it is uncluttered.  Decrease visual stimulation.  Place everyday items such as keys or cell phone in the same place every day (ie.  Basket next to front door) Use post it notes with a brief message to yourself (ie. Turn off light, lock the door) Use labels to indicate where things go (ie. Which cupboards are for food, dishes, etc.) Keep a notepad and pen by the telephone to take messages   3)  Memory  Aids A diary or journal/notebook/daily planner Making a list (shopping list, chore list, to do list that needs to be done) Using an alarm as a reminder (kitchen timer or cell phone alarm) Using cell phone to store information (Notes, Calendar, Reminders) Calendar/White board placed in a prominent position Post-it notes   In order for memory aids to be useful, you need to have good habits.  It's no good remembering to make a note in your journal if you don't remember to look in it.  Try setting aside a certain time of day to look in journal.   4)  Improving mood and managing fatigue. There may be other factors that contribute to memory difficulties.  Factors, such as anxiety, depression and tiredness can affect memory. Regular gentle exercise can help improve your mood and give you more energy. Exercise: there are short videos created by the [employer] on Health specially for older adults: https://bit.ly/2I30q97.  Mediterranean diet: which emphasizes fruits, vegetables, whole grains, legumes, fish, and other seafood; unsaturated fats such as olive oils; and low amounts of red meat, eggs, and sweets. A variation of this, called MIND (Mediterranean-DASH Intervention for Neurodegenerative Delay) incorporates the DASH (Dietary Approaches to Stop Hypertension) diet, which has been shown to lower high blood pressure, a risk factor for Alzheimer's disease. More information at: ExitMarketing.de.  Aerobic exercise that improve heart health  is also good for the mind.  [employer] on Aging have short videos for exercises that you can do at home: BlindWorkshop.com.pt Simple relaxation techniques may help relieve symptoms of anxiety Try to get back to completing activities or hobbies you enjoyed doing in the past. Learn to pace yourself through activities to decrease fatigue. Find out about some local support groups  where you can share experiences with others. Try and achieve 7-8 hours of sleep at night.

## 2022-12-28 ENCOUNTER — Other Ambulatory Visit (HOSPITAL_COMMUNITY): Payer: Self-pay

## 2022-12-28 ENCOUNTER — Other Ambulatory Visit: Payer: Self-pay

## 2022-12-29 ENCOUNTER — Other Ambulatory Visit (HOSPITAL_COMMUNITY): Payer: Self-pay

## 2022-12-29 ENCOUNTER — Other Ambulatory Visit: Payer: Self-pay

## 2022-12-30 ENCOUNTER — Other Ambulatory Visit (HOSPITAL_COMMUNITY): Payer: Self-pay

## 2022-12-30 ENCOUNTER — Ambulatory Visit: Payer: 59 | Admitting: Family Medicine

## 2022-12-30 ENCOUNTER — Encounter: Payer: Self-pay | Admitting: Family Medicine

## 2022-12-30 VITALS — BP 118/78 | HR 80 | Ht 65.75 in | Wt 186.6 lb

## 2022-12-30 DIAGNOSIS — R69 Illness, unspecified: Secondary | ICD-10-CM | POA: Diagnosis not present

## 2022-12-30 DIAGNOSIS — D649 Anemia, unspecified: Secondary | ICD-10-CM | POA: Diagnosis not present

## 2022-12-30 DIAGNOSIS — G35 Multiple sclerosis: Secondary | ICD-10-CM | POA: Diagnosis not present

## 2022-12-30 DIAGNOSIS — F39 Unspecified mood [affective] disorder: Secondary | ICD-10-CM

## 2022-12-30 DIAGNOSIS — R2689 Other abnormalities of gait and mobility: Secondary | ICD-10-CM

## 2022-12-30 DIAGNOSIS — G2581 Restless legs syndrome: Secondary | ICD-10-CM

## 2022-12-30 DIAGNOSIS — R41840 Attention and concentration deficit: Secondary | ICD-10-CM

## 2022-12-30 DIAGNOSIS — E0840 Diabetes mellitus due to underlying condition with diabetic neuropathy, unspecified: Secondary | ICD-10-CM

## 2022-12-30 MED ORDER — ROPINIROLE HCL 2 MG PO TABS
2.0000 mg | ORAL_TABLET | Freq: Every day | ORAL | 11 refills | Status: DC
  Filled 2022-12-30 – 2023-01-26 (×2): qty 60, 30d supply, fill #0
  Filled 2023-02-26: qty 60, 30d supply, fill #1
  Filled 2023-03-27: qty 60, 30d supply, fill #2
  Filled 2023-05-04: qty 60, 30d supply, fill #3
  Filled 2023-06-02: qty 60, 30d supply, fill #4

## 2022-12-30 MED ORDER — TRAMADOL-ACETAMINOPHEN 37.5-325 MG PO TABS
1.0000 | ORAL_TABLET | Freq: Four times a day (QID) | ORAL | 0 refills | Status: DC | PRN
  Filled 2022-12-30: qty 10, 3d supply, fill #0

## 2022-12-30 MED ORDER — SERTRALINE HCL 100 MG PO TABS
100.0000 mg | ORAL_TABLET | Freq: Every day | ORAL | 4 refills | Status: DC
  Filled 2022-12-30: qty 30, 30d supply, fill #0
  Filled 2023-01-26: qty 30, 30d supply, fill #1
  Filled 2023-03-05: qty 30, 30d supply, fill #2
  Filled 2023-04-12: qty 30, 30d supply, fill #3
  Filled 2023-05-10: qty 30, 30d supply, fill #4
  Filled 2023-06-09: qty 30, 30d supply, fill #5
  Filled 2023-07-13: qty 30, 30d supply, fill #6
  Filled 2023-08-29: qty 30, 30d supply, fill #7
  Filled 2023-09-29 – 2023-10-19 (×2): qty 30, 30d supply, fill #8
  Filled 2023-11-23: qty 30, 30d supply, fill #9
  Filled 2023-12-20: qty 30, 30d supply, fill #10

## 2023-01-05 ENCOUNTER — Other Ambulatory Visit: Payer: Self-pay

## 2023-01-06 ENCOUNTER — Other Ambulatory Visit: Payer: Self-pay

## 2023-01-07 ENCOUNTER — Other Ambulatory Visit (HOSPITAL_COMMUNITY): Payer: Self-pay

## 2023-01-07 ENCOUNTER — Other Ambulatory Visit: Payer: Self-pay

## 2023-01-26 ENCOUNTER — Other Ambulatory Visit (HOSPITAL_COMMUNITY): Payer: Self-pay

## 2023-01-26 ENCOUNTER — Other Ambulatory Visit: Payer: Self-pay

## 2023-01-28 ENCOUNTER — Other Ambulatory Visit (HOSPITAL_COMMUNITY): Payer: Self-pay

## 2023-02-02 ENCOUNTER — Other Ambulatory Visit (HOSPITAL_COMMUNITY): Payer: Self-pay

## 2023-02-02 MED ORDER — LISINOPRIL 10 MG PO TABS
10.0000 mg | ORAL_TABLET | Freq: Every day | ORAL | 1 refills | Status: DC
  Filled 2023-02-02: qty 30, 30d supply, fill #0
  Filled 2023-03-15: qty 30, 30d supply, fill #1
  Filled 2023-04-12: qty 30, 30d supply, fill #2
  Filled 2023-05-10: qty 30, 30d supply, fill #3
  Filled 2023-06-09: qty 30, 30d supply, fill #4
  Filled 2023-07-13: qty 30, 30d supply, fill #5

## 2023-02-26 ENCOUNTER — Other Ambulatory Visit: Payer: Self-pay

## 2023-02-26 ENCOUNTER — Other Ambulatory Visit (HOSPITAL_COMMUNITY): Payer: Self-pay

## 2023-03-01 ENCOUNTER — Other Ambulatory Visit: Payer: Self-pay | Admitting: *Deleted

## 2023-03-01 ENCOUNTER — Other Ambulatory Visit (HOSPITAL_COMMUNITY): Payer: Self-pay

## 2023-03-01 MED ORDER — TRAMADOL-ACETAMINOPHEN 37.5-325 MG PO TABS
1.0000 | ORAL_TABLET | Freq: Four times a day (QID) | ORAL | 0 refills | Status: DC | PRN
  Filled 2023-03-01: qty 10, 3d supply, fill #0

## 2023-03-01 NOTE — Telephone Encounter (Signed)
Last seen on 12/30/22 per note "We will continue current plan. Consider updating MRI in the next year. Will discuss with Dr Brett Fairy. I will give you 10 Ultracet tablets to help with breakthrough pain. Use sparingly. Monitor for worsening skin itching. "  Follow up visits scheduled on 06/30/23 with Dr. Brett Fairy.   Last filled on 01/01/23 # 10 tablet ( 3 day supply) Rx pending to be signed

## 2023-03-08 ENCOUNTER — Other Ambulatory Visit (HOSPITAL_COMMUNITY): Payer: Self-pay

## 2023-03-08 MED ORDER — TRULICITY 4.5 MG/0.5ML ~~LOC~~ SOAJ
4.5000 mg | SUBCUTANEOUS | 1 refills | Status: DC
  Filled 2023-03-08: qty 2, 28d supply, fill #0
  Filled 2023-03-27: qty 2, 28d supply, fill #1

## 2023-03-09 ENCOUNTER — Other Ambulatory Visit (HOSPITAL_COMMUNITY): Payer: Self-pay

## 2023-03-09 MED ORDER — ACYCLOVIR 400 MG PO TABS
400.0000 mg | ORAL_TABLET | Freq: Every day | ORAL | 0 refills | Status: DC
  Filled 2023-03-09: qty 35, 7d supply, fill #0

## 2023-03-09 MED ORDER — LIDOCAINE VISCOUS HCL 2 % MT SOLN
10.0000 mL | OROMUCOSAL | 0 refills | Status: DC | PRN
  Filled 2023-03-09: qty 100, 2d supply, fill #0

## 2023-03-09 MED ORDER — PREDNISONE 10 MG PO TABS
ORAL_TABLET | ORAL | 0 refills | Status: AC
  Filled 2023-03-09: qty 21, 6d supply, fill #0

## 2023-03-10 ENCOUNTER — Other Ambulatory Visit (HOSPITAL_COMMUNITY): Payer: Self-pay

## 2023-03-10 DIAGNOSIS — Z1231 Encounter for screening mammogram for malignant neoplasm of breast: Secondary | ICD-10-CM | POA: Diagnosis not present

## 2023-03-10 DIAGNOSIS — N764 Abscess of vulva: Secondary | ICD-10-CM | POA: Diagnosis not present

## 2023-03-10 DIAGNOSIS — Z01419 Encounter for gynecological examination (general) (routine) without abnormal findings: Secondary | ICD-10-CM | POA: Diagnosis not present

## 2023-03-10 DIAGNOSIS — Z6831 Body mass index (BMI) 31.0-31.9, adult: Secondary | ICD-10-CM | POA: Diagnosis not present

## 2023-03-10 MED ORDER — CIPROFLOXACIN HCL 250 MG PO TABS
250.0000 mg | ORAL_TABLET | Freq: Two times a day (BID) | ORAL | 0 refills | Status: DC
  Filled 2023-03-10: qty 20, 10d supply, fill #0

## 2023-03-15 ENCOUNTER — Other Ambulatory Visit (HOSPITAL_COMMUNITY): Payer: Self-pay

## 2023-03-16 ENCOUNTER — Other Ambulatory Visit (HOSPITAL_COMMUNITY): Payer: Self-pay

## 2023-03-16 ENCOUNTER — Other Ambulatory Visit: Payer: Self-pay

## 2023-03-16 MED ORDER — BASAGLAR KWIKPEN 100 UNIT/ML ~~LOC~~ SOPN
10.0000 [IU] | PEN_INJECTOR | Freq: Every day | SUBCUTANEOUS | 1 refills | Status: DC
  Filled 2023-03-16: qty 3, 30d supply, fill #0
  Filled 2023-05-04: qty 3, 30d supply, fill #1
  Filled 2023-06-03: qty 3, 30d supply, fill #2
  Filled 2023-07-13: qty 3, 30d supply, fill #3
  Filled 2023-08-18: qty 3, 30d supply, fill #4
  Filled 2023-09-13: qty 3, 30d supply, fill #5

## 2023-03-25 ENCOUNTER — Other Ambulatory Visit (HOSPITAL_COMMUNITY): Payer: Self-pay

## 2023-03-25 DIAGNOSIS — N764 Abscess of vulva: Secondary | ICD-10-CM | POA: Diagnosis not present

## 2023-03-25 MED ORDER — CIPROFLOXACIN HCL 250 MG PO TABS
250.0000 mg | ORAL_TABLET | Freq: Two times a day (BID) | ORAL | 0 refills | Status: DC
  Filled 2023-03-25: qty 20, 10d supply, fill #0

## 2023-03-29 ENCOUNTER — Other Ambulatory Visit (HOSPITAL_COMMUNITY): Payer: Self-pay

## 2023-04-12 ENCOUNTER — Encounter: Payer: Self-pay | Admitting: Family Medicine

## 2023-04-13 ENCOUNTER — Other Ambulatory Visit: Payer: Self-pay | Admitting: *Deleted

## 2023-04-13 DIAGNOSIS — G35 Multiple sclerosis: Secondary | ICD-10-CM

## 2023-04-13 MED ORDER — BETASERON 0.3 MG ~~LOC~~ KIT
PACK | SUBCUTANEOUS | 11 refills | Status: DC
Start: 2023-04-13 — End: 2023-06-30

## 2023-04-15 ENCOUNTER — Other Ambulatory Visit (HOSPITAL_COMMUNITY): Payer: Self-pay

## 2023-04-15 MED ORDER — GLUCOSE BLOOD VI STRP
ORAL_STRIP | 3 refills | Status: DC
  Filled 2023-04-15 – 2023-05-04 (×3): qty 50, 25d supply, fill #0
  Filled 2023-06-06: qty 50, 25d supply, fill #1
  Filled 2023-09-06: qty 50, 25d supply, fill #2
  Filled 2023-12-13: qty 50, 25d supply, fill #3

## 2023-04-16 ENCOUNTER — Other Ambulatory Visit (HOSPITAL_COMMUNITY): Payer: Self-pay

## 2023-04-19 ENCOUNTER — Other Ambulatory Visit (HOSPITAL_COMMUNITY): Payer: Self-pay

## 2023-04-19 MED ORDER — DEXCOM G7 RECEIVER DEVI
0 refills | Status: DC
  Filled 2023-04-19: qty 1, 30d supply, fill #0

## 2023-04-19 MED ORDER — DEXCOM G7 SENSOR MISC
3 refills | Status: DC
  Filled 2023-04-19: qty 3, 30d supply, fill #0
  Filled 2023-05-10: qty 3, 30d supply, fill #1
  Filled 2023-06-02 – 2023-06-04 (×3): qty 3, 30d supply, fill #2
  Filled 2023-07-13: qty 3, 30d supply, fill #3
  Filled 2023-08-08: qty 3, 30d supply, fill #4
  Filled 2023-09-13: qty 3, 30d supply, fill #5
  Filled 2023-10-10: qty 3, 30d supply, fill #6
  Filled 2023-11-23 – 2023-11-24 (×2): qty 3, 30d supply, fill #7

## 2023-04-21 ENCOUNTER — Other Ambulatory Visit (HOSPITAL_COMMUNITY): Payer: Self-pay

## 2023-04-26 ENCOUNTER — Other Ambulatory Visit (HOSPITAL_COMMUNITY): Payer: Self-pay

## 2023-04-27 ENCOUNTER — Other Ambulatory Visit (HOSPITAL_COMMUNITY): Payer: Self-pay

## 2023-04-27 MED ORDER — TRULICITY 4.5 MG/0.5ML ~~LOC~~ SOAJ
4.5000 mg | SUBCUTANEOUS | 5 refills | Status: DC
  Filled 2023-04-27: qty 2, 28d supply, fill #0
  Filled 2023-06-09: qty 2, 28d supply, fill #1

## 2023-04-29 DIAGNOSIS — Z1211 Encounter for screening for malignant neoplasm of colon: Secondary | ICD-10-CM | POA: Diagnosis not present

## 2023-04-29 DIAGNOSIS — Z1212 Encounter for screening for malignant neoplasm of rectum: Secondary | ICD-10-CM | POA: Diagnosis not present

## 2023-05-04 ENCOUNTER — Other Ambulatory Visit (HOSPITAL_COMMUNITY): Payer: Self-pay

## 2023-05-06 ENCOUNTER — Other Ambulatory Visit (HOSPITAL_COMMUNITY): Payer: Self-pay

## 2023-05-06 MED ORDER — OZEMPIC (2 MG/DOSE) 8 MG/3ML ~~LOC~~ SOPN
2.0000 mg | PEN_INJECTOR | SUBCUTANEOUS | 3 refills | Status: DC
  Filled 2023-05-06: qty 3, 28d supply, fill #0

## 2023-05-10 ENCOUNTER — Other Ambulatory Visit: Payer: Self-pay

## 2023-05-10 ENCOUNTER — Other Ambulatory Visit (HOSPITAL_COMMUNITY): Payer: Self-pay

## 2023-05-11 ENCOUNTER — Other Ambulatory Visit (HOSPITAL_COMMUNITY): Payer: Self-pay

## 2023-05-12 ENCOUNTER — Other Ambulatory Visit (HOSPITAL_COMMUNITY): Payer: Self-pay

## 2023-05-13 ENCOUNTER — Other Ambulatory Visit (HOSPITAL_COMMUNITY): Payer: Self-pay

## 2023-05-13 MED ORDER — VICTOZA 18 MG/3ML ~~LOC~~ SOPN
1.8000 mg | PEN_INJECTOR | Freq: Every day | SUBCUTANEOUS | 2 refills | Status: DC
  Filled 2023-05-13: qty 9, 30d supply, fill #0
  Filled 2023-06-06: qty 9, 30d supply, fill #1
  Filled 2023-07-31: qty 9, 30d supply, fill #2

## 2023-05-17 ENCOUNTER — Other Ambulatory Visit (HOSPITAL_COMMUNITY): Payer: Self-pay

## 2023-05-18 ENCOUNTER — Other Ambulatory Visit (HOSPITAL_COMMUNITY): Payer: Self-pay

## 2023-05-19 ENCOUNTER — Other Ambulatory Visit (HOSPITAL_COMMUNITY): Payer: Self-pay

## 2023-06-02 ENCOUNTER — Other Ambulatory Visit: Payer: Self-pay

## 2023-06-02 ENCOUNTER — Other Ambulatory Visit (HOSPITAL_COMMUNITY): Payer: Self-pay

## 2023-06-02 MED ORDER — LATANOPROST 0.005 % OP SOLN
1.0000 [drp] | Freq: Every evening | OPHTHALMIC | 2 refills | Status: AC
  Filled 2023-06-02: qty 2.5, 25d supply, fill #0
  Filled 2023-06-25: qty 2.5, 25d supply, fill #1
  Filled 2023-07-31: qty 2.5, 25d supply, fill #2
  Filled 2023-08-29: qty 2.5, 25d supply, fill #3
  Filled 2023-09-20: qty 2.5, 25d supply, fill #4
  Filled 2023-11-01: qty 2.5, 25d supply, fill #5
  Filled 2023-11-22: qty 2.5, 25d supply, fill #6
  Filled 2023-12-20: qty 2.5, 25d supply, fill #7
  Filled 2024-01-26 – 2024-03-09 (×2): qty 2.5, 25d supply, fill #8

## 2023-06-03 ENCOUNTER — Other Ambulatory Visit (HOSPITAL_COMMUNITY): Payer: Self-pay

## 2023-06-03 ENCOUNTER — Other Ambulatory Visit: Payer: Self-pay

## 2023-06-04 ENCOUNTER — Other Ambulatory Visit (HOSPITAL_COMMUNITY): Payer: Self-pay

## 2023-06-07 ENCOUNTER — Other Ambulatory Visit (HOSPITAL_COMMUNITY): Payer: Self-pay

## 2023-06-07 ENCOUNTER — Other Ambulatory Visit: Payer: Self-pay

## 2023-06-08 ENCOUNTER — Other Ambulatory Visit (HOSPITAL_COMMUNITY): Payer: Self-pay

## 2023-06-08 ENCOUNTER — Encounter: Payer: Self-pay | Admitting: Family Medicine

## 2023-06-09 ENCOUNTER — Other Ambulatory Visit (HOSPITAL_COMMUNITY): Payer: Self-pay

## 2023-06-15 ENCOUNTER — Telehealth: Payer: Self-pay

## 2023-06-15 NOTE — Telephone Encounter (Signed)
Received a faxes from [employer] approval for patient 06/15/23 - 06/14/24.

## 2023-06-15 NOTE — Telephone Encounter (Signed)
Called patient to informed her about the [employer]. LVM to give [place] a call back if she had and question.

## 2023-06-28 ENCOUNTER — Other Ambulatory Visit (HOSPITAL_COMMUNITY): Payer: Self-pay

## 2023-06-28 MED ORDER — TRULICITY 4.5 MG/0.5ML ~~LOC~~ SOAJ
4.5000 mg | SUBCUTANEOUS | 5 refills | Status: DC
  Filled 2023-07-13: qty 2, 28d supply, fill #0
  Filled 2023-08-08: qty 2, 28d supply, fill #1
  Filled 2023-09-06: qty 2, 28d supply, fill #2
  Filled 2023-10-08: qty 2, 28d supply, fill #3
  Filled 2023-11-04: qty 2, 28d supply, fill #4
  Filled 2023-12-13 – 2024-04-07 (×2): qty 2, 28d supply, fill #5

## 2023-06-29 ENCOUNTER — Other Ambulatory Visit (HOSPITAL_COMMUNITY): Payer: Self-pay

## 2023-06-30 ENCOUNTER — Encounter: Payer: Self-pay | Admitting: Neurology

## 2023-06-30 ENCOUNTER — Ambulatory Visit (INDEPENDENT_AMBULATORY_CARE_PROVIDER_SITE_OTHER): Payer: 59 | Admitting: Neurology

## 2023-06-30 ENCOUNTER — Other Ambulatory Visit (HOSPITAL_COMMUNITY): Payer: Self-pay

## 2023-06-30 DIAGNOSIS — G35 Multiple sclerosis: Secondary | ICD-10-CM

## 2023-06-30 MED ORDER — METHYLPHENIDATE HCL 5 MG PO TABS
5.0000 mg | ORAL_TABLET | Freq: Every day | ORAL | 0 refills | Status: DC
  Filled 2023-07-31: qty 30, 30d supply, fill #0

## 2023-06-30 MED ORDER — BETASERON 0.3 MG ~~LOC~~ KIT
0.3000 mg | PACK | SUBCUTANEOUS | 11 refills | Status: DC
Start: 2023-06-30 — End: 2024-04-13
  Filled 2023-06-30: qty 14, 28d supply, fill #0

## 2023-06-30 MED ORDER — METHYLPHENIDATE HCL 5 MG PO TABS
5.0000 mg | ORAL_TABLET | Freq: Every day | ORAL | 0 refills | Status: DC
  Filled 2023-06-30: qty 30, 30d supply, fill #0

## 2023-06-30 MED ORDER — ROPINIROLE HCL 2 MG PO TABS
2.0000 mg | ORAL_TABLET | Freq: Three times a day (TID) | ORAL | 11 refills | Status: DC
  Filled 2023-06-30: qty 90, 30d supply, fill #0
  Filled 2023-06-30: qty 14, 5d supply, fill #0
  Filled 2023-06-30: qty 76, 25d supply, fill #0
  Filled 2023-07-31: qty 90, 30d supply, fill #1
  Filled 2023-08-29: qty 90, 30d supply, fill #2
  Filled 2023-09-29 – 2023-10-19 (×2): qty 90, 30d supply, fill #3
  Filled 2023-11-23: qty 90, 30d supply, fill #4
  Filled 2023-12-20: qty 90, 30d supply, fill #5
  Filled 2024-01-23: qty 90, 30d supply, fill #6
  Filled 2024-03-09: qty 90, 30d supply, fill #7
  Filled 2024-04-07: qty 90, 30d supply, fill #8

## 2023-06-30 MED ORDER — METHYLPHENIDATE HCL 5 MG PO TABS
5.0000 mg | ORAL_TABLET | Freq: Every day | ORAL | 0 refills | Status: DC
  Filled 2023-08-29: qty 30, 30d supply, fill #0

## 2023-06-30 NOTE — Progress Notes (Signed)
Provider:  Melvyn Novas, MD  Primary Care Physician:  Kirby Funk, MD (Inactive) [address] [place] [address]     Referring Provider: Kirby Funk, Md [address] [place],  [place] [address]          Chief Complaint according to patient   Patient presents with:     New Patient (Initial Visit)           HISTORY OF PRESENT ILLNESS:  Erica Powell is a 57 y.o. female patient who is here for revisit 06/30/2023 for  MS follow up- she has settled out of court with her disability company , [employer] .  Chief concern according to patient :  Pt is following up on MS. Pt states has been feeling more tired than usual, sleeping more hours. Pt states left side of her head feels heavy - she feels as if off balance and drifting to the left . 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  She lives with a significant other and she feels safe at home.  Her right leg hurts often severely, there are some jerking movements and she described the pain as deep in her bones.  She supports her mother financially.  She has a sister who is officially paid as a caretaker for their mother but fails to show up, to clean, to pay attention.       RV 07-28-2022: RV Erica Powell presents with concerns about her MS condition, Erica Powell had seen Dr Shelva Majestic again in June 2023, for a meeting.  He supports her disability claims.  I supported her disability claim.     She  had frequent phone conversations with [place] since Summer 2022: in the meantime she has seen a psychiatrist- who found no evidence of mood disorder.  Zoloft 100 mg has not made a difference and she was willing to continue the medication-  She reports ongoing forgetfulness, forgets bills and check book balancing were off.  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  Biggest thing is her distractibility- she cannot multi task any longer.  All this affect her employability. 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 Her supervisor has clearly stated that her work performance was seriously impaired.     MRI : Her white matter changes in her brain are clearly not normal for age. We can go back to 03-2005 and compare images.   Abnormal MRI scan of the brain showing bilateral periventricular subcortical and pontine nonspecific white matter hyperintensities which are compatible with but not necessarily specific for multiple sclerosis and may also be seen in small vessel disease or vasculitic conditions.   No enhancing lesions are noted.  Remote age left basal ganglia cystic lesion is noted possibly remote age lacunar infarct.  No significant atrophy is noted.  Overall slight progression of white matter changes compared to previous MRI from January 2022.   There was no evidence of vasculitis.   MRI cervical spine : 2020 Spinal cord: Minimal residual signal  abnormality within the right cord at the C4-5 level and in the left cord at the C5-6 level. No new or active demyelinating  lesion. No abnormal contrast enhancement.   Posterior Fossa, vertebral arteries, paraspinal tissues: Visualized posterior fossa is normal. Vertebral artery flow voids are preserved. No prevertebral soft tissue swelling.     CSF was positive for oligoclonal bands in 2006. MS diagnosis.   There is reportedly , according to psychiatrist, no mood disorder present.    I support her long term disability claim.  The patent has not been abe to work since 09-2021, based on cognitive impairment,  difficulties with reading letters and numbers, inability to multitask, unable to focus.  Physically limited by back pain, leg myoclonus, gait instability. Left eye abducens weakness.  The combination of cognitive and physical symptoms makes her employment impossible.        07-29-2021: I am seeing Erica Powell,  Today is a revisit more than 2 months after we last met but she has undergone he had COVID testing in the meantime.  I repeated a home sleep test with her which showed no apnea.  We repeated an MRI of the brain which showed no acute MS lesions her scan was still abnormal due to by lateral periventricular subcortical and pontine white matter hyperintensities.  These are usually seen with multiple sclerosis but this is not the only disease or process that could cause them.  We looked at a lacunar infarct which has been known from previous MRIs and in comparison to her MRI from January 2022 there has been only a slight progression of white matter disease.  Our concern is that it may be related to diabetes and her A1c she says is very well controlled.  She has remarkably no brain atrophy no migraines, and she is not hypertensive.  Her cervical spine MRI shows the same lesion for many years that was actually a diagnostic for MS.  She has remained on betaseron. The patient's EEG from 6-13 2022 was normal iron studies with her primary care physician had revealed some iron deficiency anemia which is treated, underwent detailed testing the strong low number doctor of psychology and this took place in 2 sessions well the concern was that the patient has had overwhelming weakness, trembling experiences, word finding difficulties problems with focusing but also with fluently speaking.  And he found her thought process coherent her mood more dysphoric but her affect appropriate, the formal testing was performed last week on 8-9- 22.   She had extensive formal testing that day, full report is still to  follow.  She is supposed to return to work tomorrow for a night shift.  I see her as depressed or frustrated by caregiver burden.              05-07-2021: URGENT WID-Rv for this MS patient with recent excessive fatigue, weakness spells, falls ! Presents to follow up with new problems , question of MS exacerbation. She recently had a surgical procedure M(ay 18th, dr Huntley Dec)  and noticed  since then she began to decline. 2 weeks ago she was walking down her steps and slipped on last 3 steps,  bruising her foot. 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 She got off work only to fall asleep in her car in the driveway, overslept by an hour. Couldn't finish her work day,  Her vision has changed, she was unable  to keep the car straight in the lane.  Had an episode of slurred speech last Wednesday , a week ago, and her arms felt so heavy "like trembling".Now presenting for forgetfulness, cognitive impairment, unable to multitask, calculate or spell as she used to. Even her piano play has suffered, she has a delayed hand eye coordination, and she missed clues to coordinate her organ with the priest and choir.   She has been seen by psychiatrist, has been on Zoloft, increased from 50 to 100 mg and there is according to her report no improvement , psychiatrist reportedly did not feel a mood disorder to be present.  Her ability to function at work has been severely impaired, her supervisor was concerned about her decline in accuracy of calculation - and the severe problems this could have caused.  Brain MRI was showing white matter disease, slow progressive. She has positive oligoclonal bands- MS diagnosis is undisputed.  Remains on Betaseron , she was thus far  not interested in tysabri or MAB therapy.     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 The patient underwent an MRI of the brain on  January 6 of this year the MRI showed hyperintense foci in the pons and in the hemispheres some of them are radially oriented in the form of Dawson's fingers, the ones in the pons had the appearance more consistent with microvascular changes than the demyelination.  She has a chronic lacune in the left external capsule this is most likely due to the risk factor of hypertension also patients with diabetes and with migraines can develop lacunar infarct lacunar infarctions.  Nothing was acute on this last MRI that was compared to a study from April 2020. Chronic secondary progressive, MS- no longer relapsing remitting.       07-31-2020:The patient is here alone, well established 57 year -old MS patient . She states that overall things are well. She lost her companion in February and considers the Covid vaccine the cause of his death at age 20. She misses him daily and deeply. Has reported that she feels is like bone pain. She feels this on tops of arms and legs, is using the Requip and Ultracet.  She states that her RLS has been acting up. Last time ferritin level checked  08/16/19 and was 14.6- low. 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  She takes care of her mother in [place] ( who had a stroke)  and reports recently an increase in RLS and drop in ferritin( 9.9.2020 14.6) . She had a JCV panel in July 2020- negative.  We are discussing starting po iron versus iv- she has taken po iron and has not had a benefit. She reports pounding her legs in pain.       Rv from 06-12-2019, Chantal Quinton had undergone MRI imaging and we are meeting today to discuss the findings. MRI with slowly progressive demyelination - compared to studies from 2006 on. Left C 5 foraminal stenosis, corresponding to  shoulder right cervical hemicord signal- still present at C 4-5 and left C 5-6.   Review of Systems: Out of a complete 14 system review, the patient complains of only the following symptoms, and  all other reviewed systems are negative.:  Fatigue, sleepiness , snoring, fragmented sleep,   How likely are you to doze in the following situations: 0 = not likely, 1 = slight chance, 2 = moderate chance, 3 = high chance   Sitting and Reading? Watching Television? Sitting inactive in a public place (theater or meeting)? As a passenger in a car for an hour without a break? Lying down in the afternoon when circumstances permit? Sitting and talking to someone? Sitting quietly after lunch without alcohol? In a car, while stopped for a few minutes in traffic?   Total = 7/ 24 points   FSS endorsed at 39/ 63 points.   Social History   Socioeconomic History   Marital status: Single    Spouse name: Not on file   Number of children: 0   Years of education: Bachelor's   Highest education level: Not on file  Occupational History   Occupation: Physiological scientist: [employer]   Tobacco Use   Smoking status: Never   Smokeless tobacco: Never  Substance and Sexual Activity   Alcohol use: No   Drug use: No   Sexual activity: Not on file  Other Topics Concern   Not on file  Social History Narrative   Patient is single with no children   Patient is left handed   Patient has a Energy manager degree   Patient drinks 20 oz daily   Social Determinants of Corporate investment banker Strain: Not on file  Food Insecurity: Not on file  Transportation Needs: Not on file  Physical Activity: Not on file  Stress: Not on file  Social Connections: Not on file    Family History  Problem Relation Age of Onset   Stroke Mother    Lupus Sister    Cancer Maternal Uncle        bone cancer    Past Medical History:  Diagnosis Date   Anemia    Diabetes mellitus    Hyperlipemia    Hypertension     Multiple sclerosis (HCC)    RLS (restless legs syndrome)    Type 2 diabetes mellitus without (mention of) complications     Past Surgical History:  Procedure Laterality Date   BONE CYST EXCISION     ESSURE TUBAL LIGATION     WISDOM TOOTH EXTRACTION  1985     Current Outpatient Medications on File Prior to Visit  Medication Sig Dispense Refill   atorvastatin (LIPITOR) 20 MG tablet Take 1 tablet (20 mg total) by mouth daily. 90 tablet 3   betamethasone valerate (VALISONE) 0.1 % cream Apply 1 application  (a thin layer) topically to affected area daily. 15 g 1   BETASERON 0.3 MG KIT injection INJECT 0.3 MG INTO THE SKIN EVERY OTHER DAY. 14 kit 11   cholecalciferol (VITAMIN D) 1000 UNITS tablet Take 1,000 Units by mouth daily.     Continuous Glucose Receiver (DEXCOM G7 RECEIVER) DEVI Use as directed 1 each 0   Continuous Glucose Sensor (DEXCOM G7 SENSOR) MISC Apply to back of upper arm, change sensor every 10 days 10 each 3   cycloSPORINE (RESTASIS) 0.05 % ophthalmic emulsion Place 1 drop into both eyes 2 (two) times daily. 180 mL 3   Dulaglutide (TRULICITY) 4.5 MG/0.5ML SOPN Inject 4.5 mg into the skin once a week. 2 mL 5   ferrous gluconate (FERGON) 324 MG tablet Take 324 mg by mouth daily with breakfast.     fluocinonide cream (  LIDEX) 0.05 % Apply 1 Application topically to affected area 2 (two) times daily. 30 g 0   glucose blood test strip USE AS DIRECTED 1-2 TIMES DAILY 200 each 3   hydrOXYzine (ATARAX) 10 MG tablet Take 1 tablet (10 mg total) by mouth 3 (three) times daily as needed. 90 tablet 1   Insulin Glargine (BASAGLAR KWIKPEN) 100 UNIT/ML Inject 10 Units into the skin daily in the evening 9 mL 1   Insulin Pen Needle 32G X 4 MM MISC Use as directed once a day 100 each 1   latanoprost (XALATAN) 0.005 % ophthalmic solution Place 1 drop into both eyes at bedtime. 7.5 mL 2   latanoprost (XALATAN) 0.005 % ophthalmic solution Place 1 drop into both eyes at bedtime. 7.5 mL 2    liraglutide (VICTOZA) 18 MG/3ML SOPN Inject 1.8 mg into the skin daily. 9 mL 2   lisinopril (PRINIVIL,ZESTRIL) 10 MG tablet Take 1 tablet (10 mg total) by mouth daily.     lisinopril (ZESTRIL) 10 MG tablet Take 1 tablet (10 mg total) by mouth daily. 90 tablet 1   metFORMIN (GLUCOPHAGE) 1000 MG tablet Take 1 tablet (1,000 mg total) by mouth 2 (two) times daily with a meal. 180 tablet 3   rOPINIRole (REQUIP) 2 MG tablet Take 1-2 tablets (2-4 mg total) by mouth at bedtime. 60 tablet 11   sertraline (ZOLOFT) 100 MG tablet Take 1 tablet (100 mg total) by mouth daily. 90 tablet 4   traMADol-acetaminophen (ULTRACET) 37.5-325 MG tablet Take 1 tablet by mouth every 6 (six) hours as needed. 10 tablet 0   vitamin B-12 (CYANOCOBALAMIN) 1000 MCG tablet Take 1,000 mcg by mouth daily.     No current facility-administered medications on file prior to visit.    Allergies  Allergen Reactions   Cephalosporins Itching and Swelling    Occurs with cephalexin (Keflex). Starts with prickling around the face, swelling/numb feeling of face.   Farxiga [Dapagliflozin] Shortness Of Breath    SOB and burning sensation in skin   Hydrochlorothiazide Itching and Swelling    Swelling of the face, hives, itching, feeling of bad sunburn.    Penicillins Itching and Swelling    Occurs with Augmentin and amoxicillin. Hives, prickling, swelling of face.    Sulfa Antibiotics Itching and Swelling    Itching, hives, swelling of face   Baclofen Other (See Comments)    Leg spasms, jittery, heart racing    Tramadol-Acetaminophen     itchy   Clonazepam     Males me mean   Gabapentin     shaky hands picking at my chest, seeing things like a bug go across the living room floor   Lorazepam Other (See Comments)    "makes me real short-tempered, mean"     DIAGNOSTIC DATA (LABS, IMAGING, TESTING) - I reviewed patient records, labs, notes, testing and imaging myself where available.  Lab Results  Component Value Date   WBC 7.2  08/03/2018   HGB 11.0 (L) 08/03/2018   HCT 34.0 08/03/2018   MCV 89 08/03/2018   PLT 329 08/03/2018      Component Value Date/Time   NA 139 02/06/2019 1132   K 4.4 02/06/2019 1132   CL 103 02/06/2019 1132   CO2 20 02/06/2019 1132   GLUCOSE 102 (H) 02/06/2019 1132   BUN 8 02/06/2019 1132   CREATININE 0.60 02/06/2019 1132   CALCIUM 9.4 02/06/2019 1132   PROT 7.7 05/07/2021 1631   ALBUMIN 4.2 02/06/2019 1132  AST 18 02/06/2019 1132   ALT 15 02/06/2019 1132   ALKPHOS 68 02/06/2019 1132   BILITOT <0.2 02/06/2019 1132   GFRNONAA 105 02/06/2019 1132   GFRAA 121 02/06/2019 1132   No results found for: "CHOL", "HDL", "LDLCALC", "LDLDIRECT", "TRIG", "CHOLHDL" No results found for: "HGBA1C" No results found for: "VITAMINB12" No results found for: "TSH"  PHYSICAL EXAM:  Today's Vitals   06/30/23 1323  BP: (!) 144/84  Pulse: 77  Weight: 184 lb (83.5 kg)  Height: 5\' 5"  (1.651 m)   Body mass index is 30.62 kg/m.   Wt Readings from Last 3 Encounters:  06/30/23 184 lb (83.5 kg)  12/30/22 186 lb 9.6 oz (84.6 kg)  07/28/22 184 lb (83.5 kg)     Ht Readings from Last 3 Encounters:  06/30/23 5\' 5"  (1.651 m)  12/30/22 5' 5.75" (1.67 m)  07/28/22 5\' 5"  (1.651 m)    Palpitations on BACLOFEN,  dry mouth on Tizanidine, Gabapentin caused psychosis.    General: The patient is awake, alert and appears not in acute distress. The patient is well groomed. Head: Normocephalic, atraumatic. neck circumference:14.5 inches . Nasal airflow  patent.  Retrognathia is seen.  Dental status: biological  Cardiovascular:  Regular rate and cardiac rhythm by pulse,  without distended neck veins. Respiratory: Lungs are clear to auscultation.  Skin:  Without evidence of ankle edema, or rash. Trunk: The patient's posture is erect. Obese     NEUROLOGIC EXAM: The patient is awake and alert, oriented to place and time.   Memory subjective described as intact.  Attention span & concentration ability  appears normal.  Speech is fluent,  without  dysarthria, dysphonia or aphasia.  Mood and affect are appropriate.   Cranial nerves: no loss of smell or taste reported  Pupils are equal and briskly reactive to light. Extraocular movements in vertical and horizontal planes were intact and without nystagmus. No Diplopia. Visual fields by finger perimetry are intact. Hearing was intact to soft voice and finger rubbing.    Facial sensation intact to fine touch.  Facial motor strength is symmetric and tongue and uvula move midline.  Neck ROM : rotation, tilt and flexion extension were normal for age and shoulder shrug was symmetrical.    symmetric grip strength .   Coordination: Rapid alternating movements in the fingers/hands were of normal speed.  The Finger-to-nose maneuver was intact without evidence of ataxia, dysmetria or tremor.   Gait and station: Patient could rise unassisted from a seated position, walked without assistive device.  She was leaning to the left .  Stance is of  wider base and the patient turned with 4 steps.  Toe and heel walk were deferred.  Deep tendon reflexes: in the  upper and lower extremities are brisk.  Babinski response was deferred    ASSESSMENT AND PLAN 57 y.o. year old female MS patient  here with:    1) MCI - attention, some delayed word finding , not able to multitask.  She is  more angry, shorter fused.   Antidepressants did not help, never been tested for ADD, ADHD.. I would like to send her back to Dr Shelva Majestic, who supported her disability claim based on brain organic symptoms. She needs an evaluation for Attention. Trial of  ritalin.   2) myoclonic jerks and feeling of deep pain, as if in a vice. Very likely to be MS related - see images.   3) balance problems, all long standing.  Attributed to MS.  Refilled medications, not starting any pain meds, she would need to see an orthopedic or pain physician for this . I will offer a trial  of  ritalin.   I plan to follow up alternating with  our NP within 6 months.   I would like to thank Kirby Funk, Md [address] [place],  [place] [address] for allowing me to meet with and to take care of this pleasant patient.   After spending a total time of  35  minutes face to face and additional time for physical and neurologic examination, review of laboratory studies,  personal review of imaging studies, reports and results of other testing and review of referral information / records as far as provided in visit,   Electronically signed by: Melvyn Novas, MD 06/30/2023 2:02 PM  [facility] Board certified by The [employer] of Sleep Medicine and Diplomate of the [employer] of Sleep Medicine. Board certified In Neurology through the ABPN, Fellow of the [employer] of Neu[employer].

## 2023-06-30 NOTE — Patient Instructions (Addendum)
ASSESSMENT AND PLAN 57 y.o. year old female MS patient  here with:    1) [facility]CI - attention, some delayed word finding , not able to multitask.  She is  more angry, shorter fused.   Antidepressants did not help, never been tested for ADD, ADHD.. I would like to send her back to Dr Shelva Majestic, who supported her disability claim based on brain organic symptoms. She needs an evaluation for Attention. Trial of  ritalin.   2) myoclonic jerks and feeling of deep pain, as if in a vice. Very likely to be MS related - see images.   3) balance problems, all long standing.  Attributed to MS.     Refilled medications, not starting any pain meds, she would need to see an orthopedic or pain physician for this . I will offer a trial  of ritalin.   I plan to follow up alternating with  our NP within 6 months.    Multiple Sclerosis Multiple sclerosis (MS) is a disease of the brain, spinal cord, and optic nerves (central nervous system). It causes the body's disease-fighting system (immunesystem) to destroy the protective covering around nerves in the brain (myelin sheath). When this happens, signals (nerve impulses) going to and from the brain and spinal cord do not get sent properly or may not get sent at all. There are several types of MS: Relapsing-remitting MS. This is the most common type. This causes sudden attacks of symptoms. After an attack, you may recover completely until the next attack, or some symptoms may remain permanently. Secondary progressive MS. This usually develops after the onset of relapsing-remitting MS. Similar to relapsing-remitting MS, this type also causes sudden attacks of symptoms. Attacks may be less frequent, but symptoms slowly get worse over time. Primary progressive MS. This causes symptoms that steadily progress over time. This type of MS does not cause sudden attacks of symptoms. The age of onset of MS varies, but it often develops between 56 and 17 years of age. MS is a  lifelong (chronic) condition. There is no cure, but treatment can help slow down the progression of the disease. What are the causes? The cause of this condition is not known. What increases the risk? You are more likely to develop this condition if: You are a woman. You have a relative with MS. However, the condition is not passed from parent to child (inherited). You have a lack (deficiency) of vitamin D. You smoke. MS is more common in the [place] than in the [place]. What are the signs or symptoms? Relapsing-remitting and secondary progressive MS cause symptoms to occur in episodes or attacks that may last weeks to months. There may be long periods between attacks in which there are almost no symptoms. Primary progressive MS causes symptoms to steadily progress after they develop. Symptoms of MS vary because of the many different ways it affects the central nervous system. The main symptoms include: Vision problems and eye pain. Numbness and weakness. Inability to move your arms, hands, feet, or legs (paralysis). Balance problems. Shaking that you cannot control (tremors). Sudden muscle tightening (spasms). Problems with thinking (cognitive changes). MS can also cause symptoms that are associated with the disease but are not always the direct result of an MS attack. They may include: Inability to control when you urinate or have bowel movements (incontinence). Headaches. Fatigue. Inability to tolerate heat. Emotional changes. Depression. Pain. How is this diagnosed? This condition is diagnosed based on: Your symptoms. A  neurological exam. This involves checking your central nervous system function, such as nerve function, reflexes, and coordination. MRIs of the brain and spinal cord. Lab tests, including a lumbar puncture that tests the fluid that surrounds the brain and spinal cord (cerebrospinal fluid). Tests to measure the electrical activity of  the brain in response to stimulation (evoked potentials). How is this treated? There is no cure for MS, but medicines can help decrease the number and frequency of attacks and help relieve nuisance symptoms. Treatment options may include: Medicines that: Reduce the frequency of attacks. These medicines may be given by injection, by mouth (orally), or through an IV. Reduce inflammation (steroids). These may provide short-term relief of symptoms. Help control pain, depression, fatigue, or incontinence. Nutritional counseling. Eating a healthy, balanced diet can help with symptoms. Taking vitamin D supplements, if you have a deficiency. Using devices to help you move around (assistive devices), such as braces, a cane, or a walker. Therapy, such as: Physical therapy to strengthen and stretch your muscles. Occupational therapy to help you with everyday tasks. Alternative or complementary treatments such as massage or acupuncture. Low-impact, mild exercises, such as swimming, walking, and yoga. Regular exercise can help alleviate symptoms and increase strength and balance. Follow these instructions at home: Medicines Take over-the-counter and prescription medicines only as told by your health care provider. Ask your health care provider if the medicine prescribed to you requires you to avoid driving or using machinery. Activity Use assistive devices as recommended by your physical therapist or your health care provider. Exercise as directed by your health care provider. Return to your normal activities as told by your health care provider. Ask your health care provider what activities are safe for you. General instructions Eating healthy can help manage MS symptoms. Reach out for support. Share your feelings with friends, family, or a support group. Keep all follow-up visits. This is important. Where to find more information National Multiple Sclerosis Society:  www.nationalmssociety.org [employer] of Neurological Disorders and Stroke: [employer] National Center for Complementary and Integrative Health: [employer].br Contact a health care provider if: You feel depressed. You develop new pain or numbness. You have tremors. You have problems with sexual function. Get help right away if: You develop paralysis. You develop numbness. You have problems with your bladder or bowel function. You develop double vision. You lose vision in one or both eyes. You develop suicidal thoughts. You develop severe confusion. Get help right away if you feel like you may hurt yourself or others, or have thoughts about taking your own life. Go to your nearest emergency room or: Call 911. Call the National Suicide Prevention Lifeline at 778 363 5690 or 988. This is open 24 hours a day. Text the Crisis Text Line at 612-185-6481. Summary Multiple sclerosis (MS) is a disease of the central nervous system that causes the body's immune system to destroy the protective covering around nerves in the brain (myelin sheath). There are 3 types of MS: relapsing-remitting, secondary progressive, and primary progressive. There is no cure for MS, but medicines can help decrease the number and frequency of attacks and help relieve nuisance symptoms. Treatment may also include physical or occupational therapy. If you develop numbness, paralysis, vision problems, or other neurological symptoms, get help right away. This information is not intended to replace advice given to you by your health care provider. Make sure you discuss any questions you have with your health care provider. Document Revised: 07/30/2021 Document Reviewed: 07/30/2021 Elsevier Patient Education  2024 [employer] der Mild neurocognitive disorder, formerly known as mild cognitive impairment, is a disorder in which memory does not work as well as it should. This disorder may also cause problems with  other mental functions, including thought, communication, behavior, and completion of tasks. These problems can be noticed and measured, but they usually do not interfere with daily activities or the ability to live independently. Mild neurocognitive disorder typically develops after 56 years of age, but it can also develop at younger ages. It is not as serious as major neurocognitive disorder, also known as dementia, but it may be the first sign of it. Generally, symptoms of this condition get worse over time. In rare cases, symptoms can get better. What are the causes? This condition may be caused by: Brain disorders like Alzheimer's disease, Parkinson's disease, and other conditions that gradually damage nerve cells (neurodegenerative conditions). Diseases that affect blood vessels in the brain and result in small strokes. Certain infections, such as HIV. Traumatic brain injury. Other medical conditions, such as brain tumors, underactive thyroid (hypothyroidism), and vitamin B12 deficiency. Use of certain drugs or prescription medicines. What increases the risk? The following factors may make you more likely to develop this condition: Being older than 65 years. Being female. Low education level. Diabetes, high blood pressure, high cholesterol, and other conditions that increase the risk for blood vessel diseases. Untreated or undertreated sleep apnea. Having a certain type of gene that can be passed from parent to child (inherited). Chronic health problems such as heart disease, lung disease, liver disease, kidney disease, or depression. What are the signs or symptoms? Symptoms of this condition include: Difficulty remembering. You may: Forget names, phone numbers, or details of recent events. Forget social events and appointments. Repeatedly forget where you put your car keys or other items. Difficulty thinking and solving problems. You may have trouble with complex tasks, such as: Paying  bills. Driving in unfamiliar places. Difficulty communicating. You may have trouble: Finding the right word or naming an object. Forming a sentence that makes sense, or understanding what you read or hear. Changes in your behavior or personality. When this happens, you may: Lose interest in the things that you used to enjoy. Withdraw from social situations. Get angry more easily than usual. Act before thinking. How is this diagnosed? This condition is diagnosed based on: Your symptoms. Your health care provider may ask you and the people you spend time with, such as family and friends, about your symptoms. Evaluation of mental functions (neuropsychological testing). Your health care provider may refer you to a neurologist or mental health specialist to evaluate your mental functions in detail. To identify the cause of your condition, your health care provider may: Get a detailed medical history. Ask about use of alcohol, drugs, and prescription medicines. Do a physical exam. Order blood tests and brain imaging exams. How is this treated? Mild neurocognitive disorder that is caused by medicine use, drug use, infection, or another medical condition may improve when the cause is treated, or when medicines or drugs are stopped. If this disorder has another cause, it generally does not improve and may get worse. In these cases, the goal of treatment is to help you manage the loss of mental function. Treatments in these cases include: Medicine. Medicine mainly helps memory and behavior symptoms. Talk therapy. Talk therapy provides education, emotional support, memory aids, and other ways of making up for problems with mental function. Lifestyle changes, including: Getting regular exercise. Eating a healthy diet that  includes omega-3 fatty acids. Challenging your thinking and memory skills. Having more social interaction. Follow these instructions at home: Eating and drinking  Drink enough  fluid to keep your urine pale yellow. Eat a healthy diet that includes omega-3 fatty acids. These can be found in: Fish. Nuts. Leafy vegetables. Vegetable oils. If you drink alcohol: Limit how much you use to: 0-1 drink a day for women. 0-2 drinks a day for men. Be aware of how much alcohol is in your drink. In the [place] one drink equals one 12 oz bottle of beer (355 mL), one 5 oz glass of wine (148 mL), or one 1 oz glass of hard liquor (44 mL). Lifestyle  Get regular exercise as told by your health care provider. Do not use any products that contain nicotine or tobacco, such as cigarettes, e-cigarettes, and chewing tobacco. If you need help quitting, ask your health care provider. Practice ways to manage stress. If you need help managing stress, ask your health care provider. Continue to have social interaction. Keep your mind active with stimulating activities you enjoy, such as reading or playing games. Make sure to get quality sleep. Follow these tips: Avoid napping during the day. Keep your sleeping area dark and cool. Avoid exercising during the few hours before you go to bed. Avoid caffeine products in the evening. General instructions Take over-the-counter and prescription medicines only as told by your health care provider. Your health care provider may recommend that you avoid taking medicines that can affect thinking, such as pain medicines or sleep medicines. Work with your health care provider to find out what you need help with and what your safety needs are. Keep all follow-up visits. This is important. Where to find more information [employer] on Aging: https://walker.com/ Contact a health care provider if: You have any new symptoms. Get help right away if: You develop new confusion or your confusion gets worse. You act in ways that place you or your family in danger. Summary Mild neurocognitive disorder is a disorder in which memory does not work as well as it  should. Mild neurocognitive disorder can have many causes. It may be the first stage of dementia. To manage your condition, get regular exercise, keep your mind active, get quality sleep, and eat a healthy diet. This information is not intended to replace advice given to you by your health care provider. Make sure you discuss any questions you have with your health care provider. Document Revised: 04/08/2020 Document Reviewed: 04/08/2020 Elsevier Patient Education  2024 [employer].

## 2023-07-01 ENCOUNTER — Other Ambulatory Visit: Payer: Self-pay

## 2023-07-01 ENCOUNTER — Other Ambulatory Visit (HOSPITAL_COMMUNITY): Payer: Self-pay

## 2023-07-14 ENCOUNTER — Other Ambulatory Visit (HOSPITAL_COMMUNITY): Payer: Self-pay

## 2023-07-31 ENCOUNTER — Other Ambulatory Visit: Payer: Self-pay | Admitting: Neurology

## 2023-07-31 ENCOUNTER — Other Ambulatory Visit (HOSPITAL_COMMUNITY): Payer: Self-pay

## 2023-08-02 ENCOUNTER — Other Ambulatory Visit: Payer: Self-pay

## 2023-08-04 ENCOUNTER — Other Ambulatory Visit (HOSPITAL_COMMUNITY): Payer: Self-pay

## 2023-08-04 MED ORDER — HYDROXYZINE HCL 10 MG PO TABS
10.0000 mg | ORAL_TABLET | Freq: Three times a day (TID) | ORAL | 1 refills | Status: DC | PRN
  Filled 2023-08-04: qty 90, 30d supply, fill #0
  Filled 2023-09-13: qty 90, 30d supply, fill #1

## 2023-08-08 ENCOUNTER — Other Ambulatory Visit (HOSPITAL_COMMUNITY): Payer: Self-pay

## 2023-08-10 ENCOUNTER — Other Ambulatory Visit (HOSPITAL_COMMUNITY): Payer: Self-pay

## 2023-08-10 ENCOUNTER — Other Ambulatory Visit: Payer: Self-pay

## 2023-08-10 MED ORDER — LISINOPRIL 10 MG PO TABS
10.0000 mg | ORAL_TABLET | Freq: Every day | ORAL | 3 refills | Status: AC
  Filled 2023-08-10: qty 30, 30d supply, fill #0
  Filled 2023-09-13: qty 30, 30d supply, fill #1
  Filled 2023-10-10: qty 30, 30d supply, fill #2
  Filled 2023-11-23: qty 30, 30d supply, fill #3
  Filled 2023-12-20: qty 30, 30d supply, fill #4
  Filled 2024-01-03: qty 30, 30d supply, fill #5
  Filled 2024-01-19: qty 90, 90d supply, fill #5
  Filled 2024-04-11: qty 90, 90d supply, fill #6

## 2023-08-19 ENCOUNTER — Other Ambulatory Visit (HOSPITAL_COMMUNITY): Payer: Self-pay

## 2023-08-19 DIAGNOSIS — E1121 Type 2 diabetes mellitus with diabetic nephropathy: Secondary | ICD-10-CM | POA: Diagnosis not present

## 2023-08-19 DIAGNOSIS — M5432 Sciatica, left side: Secondary | ICD-10-CM | POA: Diagnosis not present

## 2023-08-19 DIAGNOSIS — I10 Essential (primary) hypertension: Secondary | ICD-10-CM | POA: Diagnosis not present

## 2023-08-19 DIAGNOSIS — G35 Multiple sclerosis: Secondary | ICD-10-CM | POA: Diagnosis not present

## 2023-08-19 MED ORDER — CYCLOBENZAPRINE HCL 10 MG PO TABS
10.0000 mg | ORAL_TABLET | Freq: Two times a day (BID) | ORAL | 0 refills | Status: AC | PRN
  Filled 2023-08-19: qty 20, 10d supply, fill #0

## 2023-08-29 ENCOUNTER — Other Ambulatory Visit (HOSPITAL_COMMUNITY): Payer: Self-pay

## 2023-08-30 ENCOUNTER — Other Ambulatory Visit: Payer: Self-pay

## 2023-08-30 ENCOUNTER — Other Ambulatory Visit (HOSPITAL_COMMUNITY): Payer: Self-pay

## 2023-08-30 DIAGNOSIS — M25552 Pain in left hip: Secondary | ICD-10-CM | POA: Diagnosis not present

## 2023-08-30 DIAGNOSIS — M5459 Other low back pain: Secondary | ICD-10-CM | POA: Diagnosis not present

## 2023-08-30 DIAGNOSIS — M6281 Muscle weakness (generalized): Secondary | ICD-10-CM | POA: Diagnosis not present

## 2023-08-30 MED ORDER — LIRAGLUTIDE 18 MG/3ML ~~LOC~~ SOPN
1.8000 mg | PEN_INJECTOR | Freq: Every day | SUBCUTANEOUS | 2 refills | Status: DC
  Filled 2023-08-30: qty 9, 30d supply, fill #0
  Filled 2023-10-08 – 2023-10-10 (×2): qty 9, 30d supply, fill #1
  Filled 2023-11-06: qty 9, 30d supply, fill #2

## 2023-08-31 ENCOUNTER — Other Ambulatory Visit: Payer: Self-pay

## 2023-08-31 DIAGNOSIS — M5459 Other low back pain: Secondary | ICD-10-CM | POA: Diagnosis not present

## 2023-08-31 DIAGNOSIS — M25552 Pain in left hip: Secondary | ICD-10-CM | POA: Diagnosis not present

## 2023-08-31 DIAGNOSIS — M6281 Muscle weakness (generalized): Secondary | ICD-10-CM | POA: Diagnosis not present

## 2023-09-09 ENCOUNTER — Other Ambulatory Visit (HOSPITAL_COMMUNITY): Payer: Self-pay

## 2023-09-09 DIAGNOSIS — M6281 Muscle weakness (generalized): Secondary | ICD-10-CM | POA: Diagnosis not present

## 2023-09-09 DIAGNOSIS — M5459 Other low back pain: Secondary | ICD-10-CM | POA: Diagnosis not present

## 2023-09-09 DIAGNOSIS — M25552 Pain in left hip: Secondary | ICD-10-CM | POA: Diagnosis not present

## 2023-09-12 ENCOUNTER — Other Ambulatory Visit (HOSPITAL_COMMUNITY): Payer: Self-pay

## 2023-09-13 ENCOUNTER — Other Ambulatory Visit (HOSPITAL_COMMUNITY): Payer: Self-pay

## 2023-09-13 ENCOUNTER — Other Ambulatory Visit: Payer: Self-pay

## 2023-09-13 DIAGNOSIS — M5459 Other low back pain: Secondary | ICD-10-CM | POA: Diagnosis not present

## 2023-09-13 DIAGNOSIS — M6281 Muscle weakness (generalized): Secondary | ICD-10-CM | POA: Diagnosis not present

## 2023-09-13 DIAGNOSIS — M25552 Pain in left hip: Secondary | ICD-10-CM | POA: Diagnosis not present

## 2023-09-15 ENCOUNTER — Encounter (HOSPITAL_COMMUNITY): Payer: Self-pay

## 2023-09-15 ENCOUNTER — Other Ambulatory Visit (HOSPITAL_COMMUNITY): Payer: Self-pay

## 2023-09-15 DIAGNOSIS — M5459 Other low back pain: Secondary | ICD-10-CM | POA: Diagnosis not present

## 2023-09-15 DIAGNOSIS — M6281 Muscle weakness (generalized): Secondary | ICD-10-CM | POA: Diagnosis not present

## 2023-09-15 DIAGNOSIS — M25552 Pain in left hip: Secondary | ICD-10-CM | POA: Diagnosis not present

## 2023-09-15 MED ORDER — BD PEN NEEDLE NANO 2ND GEN 32G X 4 MM MISC
1.0000 | Freq: Every day | 3 refills | Status: AC
  Filled 2023-09-15: qty 100, 100d supply, fill #0
  Filled 2023-12-20: qty 100, 100d supply, fill #1
  Filled 2024-04-07: qty 100, 100d supply, fill #2

## 2023-09-24 ENCOUNTER — Other Ambulatory Visit (HOSPITAL_COMMUNITY): Payer: Self-pay

## 2023-09-24 MED ORDER — ATORVASTATIN CALCIUM 20 MG PO TABS
20.0000 mg | ORAL_TABLET | Freq: Every day | ORAL | 3 refills | Status: DC
  Filled 2023-09-24: qty 30, 30d supply, fill #0
  Filled 2023-10-08 – 2023-11-01 (×2): qty 30, 30d supply, fill #1
  Filled 2023-12-13: qty 30, 30d supply, fill #2
  Filled 2024-01-05: qty 30, 30d supply, fill #3
  Filled 2024-09-05: qty 30, 30d supply, fill #4

## 2023-09-29 ENCOUNTER — Other Ambulatory Visit: Payer: Self-pay

## 2023-09-29 ENCOUNTER — Other Ambulatory Visit (HOSPITAL_COMMUNITY): Payer: Self-pay

## 2023-09-29 DIAGNOSIS — M6281 Muscle weakness (generalized): Secondary | ICD-10-CM | POA: Diagnosis not present

## 2023-09-29 DIAGNOSIS — M25552 Pain in left hip: Secondary | ICD-10-CM | POA: Diagnosis not present

## 2023-09-29 DIAGNOSIS — M5459 Other low back pain: Secondary | ICD-10-CM | POA: Diagnosis not present

## 2023-10-08 ENCOUNTER — Other Ambulatory Visit (HOSPITAL_COMMUNITY): Payer: Self-pay

## 2023-10-08 ENCOUNTER — Encounter (HOSPITAL_COMMUNITY): Payer: Self-pay

## 2023-10-08 ENCOUNTER — Other Ambulatory Visit: Payer: Self-pay

## 2023-10-08 MED ORDER — BASAGLAR KWIKPEN 100 UNIT/ML ~~LOC~~ SOPN
16.0000 [IU] | PEN_INJECTOR | Freq: Every evening | SUBCUTANEOUS | 0 refills | Status: DC
  Filled 2023-10-08: qty 6, 30d supply, fill #0
  Filled 2023-11-06: qty 6, 30d supply, fill #1
  Filled 2023-12-20: qty 6, 30d supply, fill #2

## 2023-10-11 ENCOUNTER — Other Ambulatory Visit (HOSPITAL_COMMUNITY): Payer: Self-pay

## 2023-10-12 ENCOUNTER — Encounter (HOSPITAL_COMMUNITY): Payer: Self-pay

## 2023-10-12 ENCOUNTER — Other Ambulatory Visit (HOSPITAL_COMMUNITY): Payer: Self-pay

## 2023-10-13 ENCOUNTER — Other Ambulatory Visit (HOSPITAL_COMMUNITY): Payer: Self-pay

## 2023-10-13 MED ORDER — METFORMIN HCL 1000 MG PO TABS
1000.0000 mg | ORAL_TABLET | Freq: Two times a day (BID) | ORAL | 3 refills | Status: AC
  Filled 2023-10-13: qty 60, 30d supply, fill #0
  Filled 2023-11-23: qty 60, 30d supply, fill #1
  Filled 2023-12-20: qty 60, 30d supply, fill #2
  Filled 2024-01-23: qty 60, 30d supply, fill #3
  Filled 2024-03-09: qty 60, 30d supply, fill #4
  Filled 2024-04-07: qty 60, 30d supply, fill #5
  Filled 2024-05-24: qty 60, 30d supply, fill #6
  Filled 2024-06-30: qty 60, 30d supply, fill #7
  Filled 2024-09-05: qty 60, 30d supply, fill #8
  Filled 2024-09-29: qty 60, 30d supply, fill #9

## 2023-11-05 ENCOUNTER — Other Ambulatory Visit (HOSPITAL_COMMUNITY): Payer: Self-pay

## 2023-11-22 ENCOUNTER — Other Ambulatory Visit (HOSPITAL_COMMUNITY): Payer: Self-pay

## 2023-11-23 ENCOUNTER — Other Ambulatory Visit (HOSPITAL_COMMUNITY): Payer: Self-pay

## 2023-11-23 ENCOUNTER — Other Ambulatory Visit: Payer: Self-pay

## 2023-11-24 ENCOUNTER — Other Ambulatory Visit (HOSPITAL_COMMUNITY): Payer: Self-pay

## 2023-11-25 ENCOUNTER — Encounter (HOSPITAL_COMMUNITY): Payer: Self-pay

## 2023-11-25 ENCOUNTER — Other Ambulatory Visit (HOSPITAL_COMMUNITY): Payer: Self-pay

## 2023-11-25 MED ORDER — FREESTYLE LIBRE 3 PLUS SENSOR MISC
3 refills | Status: AC
  Filled 2023-11-25 – 2023-12-02 (×3): qty 6, 90d supply, fill #0
  Filled 2024-02-15: qty 6, 90d supply, fill #1
  Filled 2024-04-11 – 2024-04-24 (×3): qty 6, 90d supply, fill #2
  Filled 2024-07-17: qty 6, 90d supply, fill #3
  Filled 2024-11-07: qty 4, 60d supply, fill #4

## 2023-12-02 ENCOUNTER — Other Ambulatory Visit: Payer: Self-pay

## 2023-12-02 ENCOUNTER — Other Ambulatory Visit (HOSPITAL_COMMUNITY): Payer: Self-pay

## 2023-12-09 ENCOUNTER — Other Ambulatory Visit (HOSPITAL_COMMUNITY): Payer: Self-pay

## 2023-12-13 ENCOUNTER — Other Ambulatory Visit (HOSPITAL_COMMUNITY): Payer: Self-pay

## 2023-12-13 ENCOUNTER — Other Ambulatory Visit: Payer: Self-pay

## 2023-12-20 ENCOUNTER — Other Ambulatory Visit (HOSPITAL_COMMUNITY): Payer: Self-pay

## 2023-12-20 ENCOUNTER — Other Ambulatory Visit: Payer: Self-pay

## 2023-12-20 MED ORDER — BASAGLAR KWIKPEN 100 UNIT/ML ~~LOC~~ SOPN
16.0000 [IU] | PEN_INJECTOR | Freq: Every evening | SUBCUTANEOUS | 0 refills | Status: DC
  Filled 2023-12-20: qty 6, 37d supply, fill #0
  Filled 2023-12-20: qty 15, 93d supply, fill #0

## 2023-12-21 DIAGNOSIS — I10 Essential (primary) hypertension: Secondary | ICD-10-CM | POA: Diagnosis not present

## 2023-12-21 DIAGNOSIS — E1121 Type 2 diabetes mellitus with diabetic nephropathy: Secondary | ICD-10-CM | POA: Diagnosis not present

## 2023-12-21 DIAGNOSIS — M5432 Sciatica, left side: Secondary | ICD-10-CM | POA: Diagnosis not present

## 2024-01-03 ENCOUNTER — Other Ambulatory Visit (HOSPITAL_COMMUNITY): Payer: Self-pay

## 2024-01-03 DIAGNOSIS — I10 Essential (primary) hypertension: Secondary | ICD-10-CM | POA: Diagnosis not present

## 2024-01-03 DIAGNOSIS — M1990 Unspecified osteoarthritis, unspecified site: Secondary | ICD-10-CM | POA: Diagnosis not present

## 2024-01-03 DIAGNOSIS — Z794 Long term (current) use of insulin: Secondary | ICD-10-CM | POA: Diagnosis not present

## 2024-01-03 DIAGNOSIS — R2681 Unsteadiness on feet: Secondary | ICD-10-CM | POA: Diagnosis not present

## 2024-01-03 DIAGNOSIS — F39 Unspecified mood [affective] disorder: Secondary | ICD-10-CM | POA: Diagnosis not present

## 2024-01-03 DIAGNOSIS — Z008 Encounter for other general examination: Secondary | ICD-10-CM | POA: Diagnosis not present

## 2024-01-03 DIAGNOSIS — E663 Overweight: Secondary | ICD-10-CM | POA: Diagnosis not present

## 2024-01-03 DIAGNOSIS — Z6829 Body mass index (BMI) 29.0-29.9, adult: Secondary | ICD-10-CM | POA: Diagnosis not present

## 2024-01-03 DIAGNOSIS — E785 Hyperlipidemia, unspecified: Secondary | ICD-10-CM | POA: Diagnosis not present

## 2024-01-03 DIAGNOSIS — G35 Multiple sclerosis: Secondary | ICD-10-CM | POA: Diagnosis not present

## 2024-01-03 DIAGNOSIS — E1169 Type 2 diabetes mellitus with other specified complication: Secondary | ICD-10-CM | POA: Diagnosis not present

## 2024-01-03 MED ORDER — TRULICITY 4.5 MG/0.5ML ~~LOC~~ SOAJ
4.5000 mg | SUBCUTANEOUS | 3 refills | Status: DC
  Filled 2024-01-03: qty 6, 84d supply, fill #0
  Filled 2024-04-07: qty 6, 84d supply, fill #1
  Filled 2024-06-30: qty 6, 84d supply, fill #2

## 2024-01-04 ENCOUNTER — Ambulatory Visit: Payer: 59 | Admitting: Family Medicine

## 2024-01-05 ENCOUNTER — Other Ambulatory Visit: Payer: Self-pay

## 2024-01-10 ENCOUNTER — Encounter (HOSPITAL_COMMUNITY): Payer: Self-pay

## 2024-01-10 ENCOUNTER — Other Ambulatory Visit (HOSPITAL_COMMUNITY): Payer: Self-pay

## 2024-01-17 ENCOUNTER — Other Ambulatory Visit: Payer: Self-pay

## 2024-01-17 ENCOUNTER — Other Ambulatory Visit (HOSPITAL_COMMUNITY): Payer: Self-pay

## 2024-01-17 ENCOUNTER — Encounter: Payer: Self-pay | Admitting: Family Medicine

## 2024-01-17 ENCOUNTER — Other Ambulatory Visit: Payer: Self-pay | Admitting: *Deleted

## 2024-01-17 MED ORDER — SERTRALINE HCL 100 MG PO TABS
100.0000 mg | ORAL_TABLET | Freq: Every day | ORAL | 1 refills | Status: DC
  Filled 2024-01-17: qty 90, 90d supply, fill #0
  Filled 2024-04-11: qty 90, 90d supply, fill #1

## 2024-01-19 ENCOUNTER — Other Ambulatory Visit (HOSPITAL_COMMUNITY): Payer: Self-pay

## 2024-01-24 ENCOUNTER — Other Ambulatory Visit: Payer: Self-pay

## 2024-01-24 ENCOUNTER — Other Ambulatory Visit (HOSPITAL_COMMUNITY): Payer: Self-pay

## 2024-01-25 ENCOUNTER — Other Ambulatory Visit: Payer: Self-pay

## 2024-01-26 ENCOUNTER — Other Ambulatory Visit: Payer: Self-pay

## 2024-01-27 ENCOUNTER — Other Ambulatory Visit (HOSPITAL_COMMUNITY): Payer: Self-pay

## 2024-02-07 ENCOUNTER — Other Ambulatory Visit (HOSPITAL_COMMUNITY): Payer: Self-pay

## 2024-02-07 MED ORDER — ATORVASTATIN CALCIUM 20 MG PO TABS
20.0000 mg | ORAL_TABLET | Freq: Every day | ORAL | 0 refills | Status: AC
  Filled 2024-02-07: qty 90, 90d supply, fill #0

## 2024-02-15 ENCOUNTER — Other Ambulatory Visit (HOSPITAL_COMMUNITY): Payer: Self-pay

## 2024-02-29 DIAGNOSIS — H1789 Other corneal scars and opacities: Secondary | ICD-10-CM | POA: Diagnosis not present

## 2024-02-29 DIAGNOSIS — H3582 Retinal ischemia: Secondary | ICD-10-CM | POA: Diagnosis not present

## 2024-02-29 DIAGNOSIS — H40023 Open angle with borderline findings, high risk, bilateral: Secondary | ICD-10-CM | POA: Diagnosis not present

## 2024-02-29 DIAGNOSIS — E113293 Type 2 diabetes mellitus with mild nonproliferative diabetic retinopathy without macular edema, bilateral: Secondary | ICD-10-CM | POA: Diagnosis not present

## 2024-03-09 ENCOUNTER — Other Ambulatory Visit (HOSPITAL_COMMUNITY): Payer: Self-pay

## 2024-03-13 DIAGNOSIS — N952 Postmenopausal atrophic vaginitis: Secondary | ICD-10-CM | POA: Diagnosis not present

## 2024-03-13 DIAGNOSIS — Z1231 Encounter for screening mammogram for malignant neoplasm of breast: Secondary | ICD-10-CM | POA: Diagnosis not present

## 2024-03-13 DIAGNOSIS — Z6830 Body mass index (BMI) 30.0-30.9, adult: Secondary | ICD-10-CM | POA: Diagnosis not present

## 2024-03-13 DIAGNOSIS — Z124 Encounter for screening for malignant neoplasm of cervix: Secondary | ICD-10-CM | POA: Diagnosis not present

## 2024-04-07 ENCOUNTER — Other Ambulatory Visit: Payer: Self-pay | Admitting: Neurology

## 2024-04-07 ENCOUNTER — Other Ambulatory Visit (HOSPITAL_COMMUNITY): Payer: Self-pay

## 2024-04-07 ENCOUNTER — Other Ambulatory Visit: Payer: Self-pay

## 2024-04-07 MED ORDER — BASAGLAR KWIKPEN 100 UNIT/ML ~~LOC~~ SOPN
16.0000 [IU] | PEN_INJECTOR | Freq: Every evening | SUBCUTANEOUS | 2 refills | Status: AC
  Filled 2024-04-07: qty 15, 93d supply, fill #0
  Filled 2024-07-12: qty 15, 93d supply, fill #1
  Filled 2024-10-18 – 2024-11-07 (×3): qty 15, 93d supply, fill #2

## 2024-04-07 MED ORDER — LIRAGLUTIDE 18 MG/3ML ~~LOC~~ SOPN
1.8000 mg | PEN_INJECTOR | Freq: Every day | SUBCUTANEOUS | 2 refills | Status: DC
Start: 2024-04-07 — End: 2024-10-25
  Filled 2024-04-07: qty 9, 30d supply, fill #0

## 2024-04-10 ENCOUNTER — Other Ambulatory Visit (HOSPITAL_COMMUNITY): Payer: Self-pay

## 2024-04-10 MED ORDER — BETAMETHASONE VALERATE 0.1 % EX CREA
1.0000 | TOPICAL_CREAM | Freq: Every day | CUTANEOUS | 0 refills | Status: DC
Start: 2024-04-10 — End: 2024-08-08
  Filled 2024-04-10: qty 15, 15d supply, fill #0

## 2024-04-10 NOTE — Patient Instructions (Signed)

## 2024-04-10 NOTE — Progress Notes (Unsigned)
 No chief complaint on file.   HISTORY OF PRESENT ILLNESS:  04/10/24 ALL:  Erica Powell is a 58 y.o. female here today for follow up for MS. She continues Betaseron . MRI 05/2021 did not show any acute changes.   She feels that she is doing well from an MS standpoint. She has occasional blurred vision. Gait is stable. When she first wakes in the morning she feels a little off balance. She bought a cane last week but has not used regularly. She had one fall several months ago. She tripped over a pile of shoes.  She continues ropinirole  4mg  at bedtime for RLS. Ultracet  helps with breakthrough pain. She had stopped Ultracet  in August 2024.   She continues to have difficulty with cognitive impairment. Neurocognitive eval with Dr Cheryll Corti showed deficits with motor control, changes in visual-spatial and visual analysis capacity, visual encoding deficits  as well as significant changes in visual memory and learning. Findings consistent with changes due to MS and vascular changes. Mood and quality of sleep also contributing. She qualified for SS disability 12/2022.  She continues sertraline  100mg  daily (PCP) and hydroxyzine  10mg  TID PRN (usually taken 1-2 times a month). She has been seen by psychiatry in the past but not recently.   She continues to see PCP regularly. Last A1C was around 8. She continues Trulicity  and Basaglar . She does have some diarrhea at times but no significant changes with bowel or bladder habits.   No changes with vision. She is followed q12mth with ophthalmology.   HISTORY (copied from Dr Dohmeier's previous note)  Erica Powell is a 58 y.o. female patient who is here for revisit 06/30/2023 for  MS follow up- she has settled out of court with her disability company , [employer] .  Chief concern according to patient :  Pt is following up on MS. Pt states has been feeling more tired than usual, sleeping more hours. Pt states left side of her head feels heavy - she  feels as if off balance and drifting to the left . 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  She lives with a significant other and she feels safe at home.  Her right leg hurts often severely, there are some jerking movements and she described the pain as deep in her bones.  She supports her mother financially.  She has a sister who is officially paid as a caretaker for their mother but fails to show up, to clean, to pay attention.     REVIEW OF SYSTEMS: Out of a complete 14 system review of symptoms, the patient complains only of the following symptoms, irritability, inattention, depression, and all other reviewed systems are negative.   ALLERGIES: Allergies  Allergen Reactions   Cephalosporins Itching and Swelling    Occurs with cephalexin (Keflex). Starts with prickling around the face, swelling/numb feeling of face.   Farxiga  [Dapagliflozin ] Shortness Of Breath    SOB and burning sensation in skin   Hydrochlorothiazide Itching and Swelling    Swelling of the face, hives, itching, feeling of bad sunburn.    Penicillins Itching and Swelling    Occurs with Augmentin and amoxicillin. Hives, prickling, swelling of face.    Sulfa Antibiotics Itching and Swelling    Itching, hives, swelling of face   Baclofen  Other (See Comments)    Leg spasms, jittery, heart racing  Tramadol -Acetaminophen      itchy   Clonazepam      Males me mean   Gabapentin      shaky hands picking at my chest, seeing things like a bug go across the living room floor   Lorazepam  Other (See Comments)    "makes me real short-tempered, mean"     HOME MEDICATIONS: Outpatient Medications Prior to Visit  Medication Sig Dispense Refill   atorvastatin  (LIPITOR) 20 MG tablet Take 1 tablet (20 mg total) by mouth daily. 90 tablet 3   atorvastatin  (LIPITOR) 20 MG tablet Take 1 tablet (20 mg total) by  mouth daily. 90 tablet 0   betamethasone  valerate (VALISONE ) 0.1 % cream Apply 1 Application (a thin layer) topically to affected area daily. 15 g 0   BETASERON  0.3 MG KIT injection Inject 0.3 mg into the skin every other day. 14 kit 11   cholecalciferol (VITAMIN D) 1000 UNITS tablet Take 1,000 Units by mouth daily.     Continuous Glucose Receiver (DEXCOM G7 RECEIVER) DEVI Use as directed 1 each 0   Continuous Glucose Sensor (DEXCOM G7 SENSOR) MISC Apply to back of upper arm, change sensor every 10 days 10 each 3   Continuous Glucose Sensor (FREESTYLE LIBRE 3 PLUS SENSOR) MISC Apply to back of upper arm, change sensor every 15 days. 7 each 3   cycloSPORINE  (RESTASIS ) 0.05 % ophthalmic emulsion Place 1 drop into both eyes 2 (two) times daily. 180 mL 3   Dulaglutide  (TRULICITY ) 4.5 MG/0.5ML SOAJ Inject 4.5 mg into the skin once a week. 2 mL 5   Dulaglutide  (TRULICITY ) 4.5 MG/0.5ML SOAJ Inject 4.5 mg into the skin once a week. 6 mL 3   ferrous gluconate (FERGON) 324 MG tablet Take 324 mg by mouth daily with breakfast.     fluocinonide  cream (LIDEX ) 0.05 % Apply 1 Application topically to affected area 2 (two) times daily. 30 g 0   glucose blood test strip USE AS DIRECTED 1-2 TIMES DAILY 200 each 3   hydrOXYzine  (ATARAX ) 10 MG tablet Take 1 tablet (10 mg total) by mouth 3 (three) times daily as needed. 90 tablet 1   Insulin  Glargine (BASAGLAR  KWIKPEN) 100 UNIT/ML Inject 16 Units into the skin every evening. 15 mL 2   Insulin  Pen Needle (BD PEN NEEDLE NANO 2ND GEN) 32G X 4 MM MISC Use once daily, as directed 100 each 3   latanoprost  (XALATAN ) 0.005 % ophthalmic solution Place 1 drop into both eyes at bedtime. 7.5 mL 2   latanoprost  (XALATAN ) 0.005 % ophthalmic solution Place 1 drop into both eyes at bedtime. 7.5 mL 2   lisinopril  (PRINIVIL ,ZESTRIL ) 10 MG tablet Take 1 tablet (10 mg total) by mouth daily.     lisinopril  (ZESTRIL ) 10 MG tablet Take 1 tablet (10 mg total) by mouth daily. 90 tablet 3    metFORMIN  (GLUCOPHAGE ) 1000 MG tablet Take 1 tablet (1,000 mg total) by mouth 2 (two) times daily with a meal. 180 tablet 3   methylphenidate  (RITALIN ) 5 MG tablet Take 1 tablet (5 mg total) by mouth daily before breakfast. 30 tablet 0   methylphenidate  (RITALIN ) 5 MG tablet Take 1 tablet (5 mg total) by mouth daily before breakfast. 30 tablet 0   methylphenidate  (RITALIN ) 5 MG tablet Take 1 tablet (5 mg total) by mouth daily before breakfast. 30 tablet 0   rOPINIRole  (REQUIP ) 2 MG tablet Take 1 tablet (2 mg total) by mouth 3 (three) times daily before meals. 90 tablet 11   sertraline  (  ZOLOFT ) 100 MG tablet Take 1 tablet (100 mg total) by mouth daily. 90 tablet 1   vitamin B-12 (CYANOCOBALAMIN) 1000 MCG tablet Take 1,000 mcg by mouth daily.     No facility-administered medications prior to visit.     PAST MEDICAL HISTORY: Past Medical History:  Diagnosis Date   Anemia    Diabetes mellitus    Hyperlipemia    Hypertension    Multiple sclerosis (HCC)    RLS (restless legs syndrome)    Type 2 diabetes mellitus without (mention of) complications      PAST SURGICAL HISTORY: Past Surgical History:  Procedure Laterality Date   BONE CYST EXCISION     ESSURE TUBAL LIGATION     WISDOM TOOTH EXTRACTION  1985     FAMILY HISTORY: Family History  Problem Relation Age of Onset   Stroke Mother    Lupus Sister    Cancer Maternal Uncle        bone cancer     SOCIAL HISTORY: Social History   Socioeconomic History   Marital status: Single    Spouse name: Not on file   Number of children: 0   Years of education: Bachelor's   Highest education level: Not on file  Occupational History   Occupation: Physiological scientist: [employer]   Tobacco Use   Smoking status: Never   Smokeless tobacco: Never  Substance and Sexual Activity   Alcohol use: No   Drug use: No   Sexual activity: Not on file  Other Topics Concern   Not on file  Social History Narrative   Patient is  single with no children   Patient is left handed   Patient has a Energy manager degree   Patient drinks 20 oz daily   Social Drivers of Corporate investment banker Strain: Not on file  Food Insecurity: Not on file  Transportation Needs: Not on file  Physical Activity: Not on file  Stress: Not on file  Social Connections: Not on file  Intimate Partner Violence: Not on file     PHYSICAL EXAM  There were no vitals filed for this visit.   There is no height or weight on file to calculate BMI.  Generalized: Well developed, in no acute distress  Cardiology: normal rate and rhythm, no murmur auscultated  Respiratory: clear to auscultation bilaterally    Neurological examination  Mentation: Alert oriented to time, place, history taking. Follows all commands speech and language fluent Cranial nerve II-XII: Pupils were equal round reactive to light. Extraocular movements were full, visual field were full on confrontational test. Facial sensation and strength were normal. Uvula tongue midline. Head turning and shoulder shrug  were normal and symmetric. Motor: The motor testing reveals 5 over 5 strength of all 4 extremities. Good symmetric motor tone is noted throughout.  Sensory: Sensory testing is intact to soft touch on all 4 extremities. No evidence of extinction is noted.  Coordination: Cerebellar testing reveals good finger-nose-finger and heel-to-shin bilaterally.  Gait and station: Gait is normal.  Reflexes: Deep tendon reflexes are symmetric and normal bilaterally.    DIAGNOSTIC DATA (LABS, IMAGING, TESTING) - I reviewed patient records, labs, notes, testing and imaging myself where available.  Lab Results  Component Value Date   WBC 7.2 08/03/2018   HGB 11.0 (L) 08/03/2018   HCT 34.0 08/03/2018   MCV 89 08/03/2018   PLT 329 08/03/2018      Component Value Date/Time   NA 139 02/06/2019 1132  K 4.4 02/06/2019 1132   CL 103 02/06/2019 1132   CO2 20 02/06/2019 1132    GLUCOSE 102 (H) 02/06/2019 1132   BUN 8 02/06/2019 1132   CREATININE 0.60 02/06/2019 1132   CALCIUM  9.4 02/06/2019 1132   PROT 7.7 05/07/2021 1631   ALBUMIN 4.2 02/06/2019 1132   AST 18 02/06/2019 1132   ALT 15 02/06/2019 1132   ALKPHOS 68 02/06/2019 1132   BILITOT <0.2 02/06/2019 1132   GFRNONAA 105 02/06/2019 1132   GFRAA 121 02/06/2019 1132   No results found for: "CHOL", "HDL", "LDLCALC", "LDLDIRECT", "TRIG", "CHOLHDL" No results found for: "HGBA1C" No results found for: "VITAMINB12" No results found for: "TSH"      No data to display              03/30/2022   10:33 AM  Montreal Cognitive Assessment   Visuospatial/ Executive (0/5) 4  Naming (0/3) 3  Attention: Read list of digits (0/2) 2  Attention: Read list of letters (0/1) 1  Attention: Serial 7 subtraction starting at 100 (0/3) 3  Language: Repeat phrase (0/2) 2  Language : Fluency (0/1) 1  Abstraction (0/2) 2  Delayed Recall (0/5) 3  Orientation (0/6) 6  Total 27  Adjusted Score (based on education) 109     ASSESSMENT AND PLAN  58 y.o. year old female  has a past medical history of Anemia, Diabetes mellitus, Hyperlipemia, Hypertension, Multiple sclerosis (HCC), RLS (restless legs syndrome), and Type 2 diabetes mellitus without (mention of) complications. here with    No diagnosis found.  Adaijah is doing well, physically. She will continue Betaseron  as directed. Labs recently completed with PCP and reportedly normal. RLS is stable on ropinirole  4mg  at bedtime. She will continue. She uses Ultracet  sparingly for breakthrough pain. PDMP appropriate. Will refill, today. She will continue sertraline  100mg  QD for mood management. Mood is stable. We have discussed updating MRI. She declines and would like to discuss with Dr Albertina Hugger at follow up. Fall precautions advised. Handicap Placard form signed. Memory compensation strategies reviewed. She will continue close follow up with PCP for co morbidity management.  She will follow up with Dr Albertina Hugger for MS follow up in 6 months. She verbalizes understanding and agreement with this plan.    No orders of the defined types were placed in this encounter.    No orders of the defined types were placed in this encounter.   I spent 30 minutes of face-to-face and non-face-to-face time with patient.  This included previsit chart review, lab review, study review, order entry, electronic health record documentation, patient education.    Terrilyn Fick, MSN, FNP-C 04/10/2024, 3:02 PM  [facility] [address], [address] [place], [place] [address] [phone]

## 2024-04-11 ENCOUNTER — Other Ambulatory Visit (HOSPITAL_COMMUNITY): Payer: Self-pay

## 2024-04-11 ENCOUNTER — Encounter (HOSPITAL_COMMUNITY): Payer: Self-pay

## 2024-04-12 ENCOUNTER — Ambulatory Visit (INDEPENDENT_AMBULATORY_CARE_PROVIDER_SITE_OTHER): Payer: 59 | Admitting: Family Medicine

## 2024-04-12 ENCOUNTER — Other Ambulatory Visit (HOSPITAL_COMMUNITY): Payer: Self-pay

## 2024-04-12 ENCOUNTER — Encounter: Payer: Self-pay | Admitting: Family Medicine

## 2024-04-12 ENCOUNTER — Other Ambulatory Visit: Payer: Self-pay

## 2024-04-12 ENCOUNTER — Encounter (HOSPITAL_COMMUNITY): Payer: Self-pay

## 2024-04-12 VITALS — BP 135/87 | HR 107 | Ht 65.0 in | Wt 186.0 lb

## 2024-04-12 DIAGNOSIS — D649 Anemia, unspecified: Secondary | ICD-10-CM | POA: Diagnosis not present

## 2024-04-12 DIAGNOSIS — G2581 Restless legs syndrome: Secondary | ICD-10-CM | POA: Diagnosis not present

## 2024-04-12 DIAGNOSIS — R200 Anesthesia of skin: Secondary | ICD-10-CM | POA: Diagnosis not present

## 2024-04-12 DIAGNOSIS — F39 Unspecified mood [affective] disorder: Secondary | ICD-10-CM | POA: Diagnosis not present

## 2024-04-12 DIAGNOSIS — G35 Multiple sclerosis: Secondary | ICD-10-CM | POA: Diagnosis not present

## 2024-04-12 DIAGNOSIS — R41840 Attention and concentration deficit: Secondary | ICD-10-CM | POA: Diagnosis not present

## 2024-04-12 DIAGNOSIS — R2689 Other abnormalities of gait and mobility: Secondary | ICD-10-CM

## 2024-04-12 DIAGNOSIS — M791 Myalgia, unspecified site: Secondary | ICD-10-CM

## 2024-04-12 MED ORDER — ROPINIROLE HCL ER 4 MG PO TB24
4.0000 mg | ORAL_TABLET | Freq: Every day | ORAL | 1 refills | Status: DC
  Filled 2024-04-12: qty 90, 90d supply, fill #0
  Filled 2024-07-07: qty 90, 90d supply, fill #1

## 2024-04-12 MED ORDER — SERTRALINE HCL 100 MG PO TABS
100.0000 mg | ORAL_TABLET | Freq: Every day | ORAL | 1 refills | Status: AC
  Filled 2024-04-12 – 2024-07-13 (×2): qty 90, 90d supply, fill #0
  Filled 2024-10-18: qty 90, 90d supply, fill #1

## 2024-04-12 MED ORDER — LISINOPRIL 10 MG PO TABS
10.0000 mg | ORAL_TABLET | Freq: Every day | ORAL | 0 refills | Status: DC
  Filled 2024-04-12 – 2024-07-16 (×2): qty 90, 90d supply, fill #0

## 2024-04-12 MED ORDER — ROPINIROLE HCL 2 MG PO TABS
2.0000 mg | ORAL_TABLET | Freq: Every day | ORAL | 1 refills | Status: DC
  Filled 2024-04-12 (×2): qty 180, 90d supply, fill #0
  Filled 2024-07-07: qty 180, 90d supply, fill #1

## 2024-04-12 MED ORDER — HYDROXYZINE HCL 10 MG PO TABS
10.0000 mg | ORAL_TABLET | Freq: Three times a day (TID) | ORAL | 1 refills | Status: DC | PRN
  Filled 2024-04-12: qty 90, 30d supply, fill #0
  Filled 2024-07-12: qty 90, 30d supply, fill #1

## 2024-04-13 ENCOUNTER — Other Ambulatory Visit: Payer: Self-pay | Admitting: *Deleted

## 2024-04-13 ENCOUNTER — Encounter: Payer: Self-pay | Admitting: Family Medicine

## 2024-04-13 ENCOUNTER — Other Ambulatory Visit (HOSPITAL_COMMUNITY): Payer: Self-pay

## 2024-04-13 ENCOUNTER — Other Ambulatory Visit: Payer: Self-pay

## 2024-04-13 DIAGNOSIS — G35 Multiple sclerosis: Secondary | ICD-10-CM

## 2024-04-13 LAB — CBC WITH DIFFERENTIAL/PLATELET
Basophils Absolute: 0 10*3/uL (ref 0.0–0.2)
Basos: 0 %
EOS (ABSOLUTE): 0 10*3/uL (ref 0.0–0.4)
Eos: 0 %
Hematocrit: 39.8 % (ref 34.0–46.6)
Hemoglobin: 13.6 g/dL (ref 11.1–15.9)
Immature Grans (Abs): 0 10*3/uL (ref 0.0–0.1)
Immature Granulocytes: 0 %
Lymphocytes Absolute: 1.2 10*3/uL (ref 0.7–3.1)
Lymphs: 17 %
MCH: 30.4 pg (ref 26.6–33.0)
MCHC: 34.2 g/dL (ref 31.5–35.7)
MCV: 89 fL (ref 79–97)
Monocytes Absolute: 0.4 10*3/uL (ref 0.1–0.9)
Monocytes: 5 %
Neutrophils Absolute: 5.6 10*3/uL (ref 1.4–7.0)
Neutrophils: 78 %
Platelets: 260 10*3/uL (ref 150–450)
RBC: 4.48 x10E6/uL (ref 3.77–5.28)
RDW: 13.3 % (ref 11.7–15.4)
WBC: 7.3 10*3/uL (ref 3.4–10.8)

## 2024-04-13 LAB — B12 AND FOLATE PANEL
Folate: >20 ng/mL (ref >3.0)
Vitamin B-12: 776 pg/mL (ref 232–1245)

## 2024-04-13 LAB — COMPREHENSIVE METABOLIC PANEL WITH GFR
ALT: 21 IU/L (ref 0–32)
AST: 18 IU/L (ref 0–40)
Albumin: 4.6 g/dL (ref 3.8–4.9)
Alkaline Phosphatase: 88 IU/L (ref 44–121)
BUN/Creatinine Ratio: 14 (ref 9–23)
BUN: 11 mg/dL (ref 6–24)
Bilirubin Total: 0.4 mg/dL (ref 0.0–1.2)
CO2: 20 mmol/L (ref 20–29)
Calcium: 9.8 mg/dL (ref 8.7–10.2)
Chloride: 97 mmol/L (ref 96–106)
Creatinine, Ser: 0.77 mg/dL (ref 0.57–1.00)
Globulin, Total: 2.9 g/dL (ref 1.5–4.5)
Glucose: 153 mg/dL — ABNORMAL HIGH (ref 70–99)
Potassium: 4.3 mmol/L (ref 3.5–5.2)
Sodium: 137 mmol/L (ref 134–144)
Total Protein: 7.5 g/dL (ref 6.0–8.5)
eGFR: 90 mL/min/{1.73_m2} (ref >59)

## 2024-04-13 MED ORDER — BETASERON 0.3 MG ~~LOC~~ KIT: 0.3000 mg | PACK | SUBCUTANEOUS | 11 refills | Status: DC

## 2024-04-19 ENCOUNTER — Telehealth: Payer: Self-pay | Admitting: Family Medicine

## 2024-04-19 NOTE — Telephone Encounter (Signed)
 Erica Powell: [id number] exp. 04/19/24-06/19/24 sent to GI [phone]

## 2024-04-24 ENCOUNTER — Other Ambulatory Visit (HOSPITAL_COMMUNITY): Payer: Self-pay

## 2024-04-24 ENCOUNTER — Other Ambulatory Visit: Payer: Self-pay

## 2024-04-27 ENCOUNTER — Other Ambulatory Visit (HOSPITAL_COMMUNITY): Payer: Self-pay

## 2024-05-08 ENCOUNTER — Other Ambulatory Visit (HOSPITAL_COMMUNITY): Payer: Self-pay

## 2024-05-08 MED ORDER — ATORVASTATIN CALCIUM 20 MG PO TABS
20.0000 mg | ORAL_TABLET | Freq: Every day | ORAL | 0 refills | Status: AC
  Filled 2024-05-08: qty 100, 100d supply, fill #0

## 2024-05-11 ENCOUNTER — Encounter: Payer: Self-pay | Admitting: Family Medicine

## 2024-05-16 ENCOUNTER — Telehealth: Payer: Self-pay

## 2024-05-16 ENCOUNTER — Other Ambulatory Visit (HOSPITAL_COMMUNITY): Payer: Self-pay

## 2024-05-16 MED ORDER — PREDNISONE 50 MG PO TABS
50.0000 mg | ORAL_TABLET | Freq: Three times a day (TID) | ORAL | 0 refills | Status: DC
  Filled 2024-05-16: qty 3, 1d supply, fill #0

## 2024-05-16 NOTE — Telephone Encounter (Signed)
 FYI

## 2024-05-16 NOTE — Telephone Encounter (Signed)
 Phone call to patient to review instructions for 13 hr prep for MRI w/ contrast on 05/21/24 at 4:40PM. Prescription called into [facility]. Pt aware and verbalized understanding of instructions. Prescription:  Pt to take 50 mg of prednisone  on 05/21/24 at 3:40AM, 50 mg of prednisone  on 05/21/24 at 9:40AM, and 50 mg of prednisone  on 05/21/24 at 3:40PM. Pt is also to take 50 mg of benadryl on 05/21/24 at 3:40PM. Please call [phone] with any questions.

## 2024-05-17 ENCOUNTER — Encounter: Payer: Self-pay | Admitting: Family Medicine

## 2024-05-21 ENCOUNTER — Ambulatory Visit
Admission: RE | Admit: 2024-05-21 | Discharge: 2024-05-21 | Disposition: A | Discharge status: Home / Self Care | Payer: Self-pay | Source: Ambulatory Visit | Attending: Family Medicine | Admitting: Family Medicine

## 2024-05-21 DIAGNOSIS — G35 Multiple sclerosis: Secondary | ICD-10-CM | POA: Diagnosis not present

## 2024-05-21 DIAGNOSIS — R200 Anesthesia of skin: Secondary | ICD-10-CM | POA: Diagnosis not present

## 2024-05-21 MED ORDER — GADOPICLENOL 0.5 MMOL/ML IV SOLN
9.0000 mL | Freq: Once | INTRAVENOUS | Status: AC | PRN
  Administered 2024-05-21: 9 mL via INTRAVENOUS

## 2024-05-22 ENCOUNTER — Ambulatory Visit: Payer: Self-pay | Admitting: Family Medicine

## 2024-05-24 ENCOUNTER — Telehealth: Payer: Self-pay | Admitting: *Deleted

## 2024-05-24 ENCOUNTER — Encounter: Payer: Self-pay | Admitting: *Deleted

## 2024-05-24 DIAGNOSIS — Z0000 Encounter for general adult medical examination without abnormal findings: Secondary | ICD-10-CM | POA: Diagnosis not present

## 2024-05-24 DIAGNOSIS — M5432 Sciatica, left side: Secondary | ICD-10-CM | POA: Diagnosis not present

## 2024-05-24 DIAGNOSIS — N182 Chronic kidney disease, stage 2 (mild): Secondary | ICD-10-CM | POA: Diagnosis not present

## 2024-05-24 DIAGNOSIS — G2581 Restless legs syndrome: Secondary | ICD-10-CM | POA: Diagnosis not present

## 2024-05-24 DIAGNOSIS — E7800 Pure hypercholesterolemia, unspecified: Secondary | ICD-10-CM | POA: Diagnosis not present

## 2024-05-24 DIAGNOSIS — I10 Essential (primary) hypertension: Secondary | ICD-10-CM | POA: Diagnosis not present

## 2024-05-24 DIAGNOSIS — J383 Other diseases of vocal cords: Secondary | ICD-10-CM | POA: Diagnosis not present

## 2024-05-24 DIAGNOSIS — G35 Multiple sclerosis: Secondary | ICD-10-CM | POA: Diagnosis not present

## 2024-05-24 DIAGNOSIS — E1121 Type 2 diabetes mellitus with diabetic nephropathy: Secondary | ICD-10-CM | POA: Diagnosis not present

## 2024-05-24 DIAGNOSIS — D509 Iron deficiency anemia, unspecified: Secondary | ICD-10-CM | POA: Diagnosis not present

## 2024-05-24 NOTE — Telephone Encounter (Signed)
 Called patient to let her know we received her [employer] form and it has been signed and faxed to [employer] [phone], confirmation received.

## 2024-07-04 ENCOUNTER — Other Ambulatory Visit (HOSPITAL_COMMUNITY): Payer: Self-pay

## 2024-07-04 DIAGNOSIS — H5213 Myopia, bilateral: Secondary | ICD-10-CM | POA: Diagnosis not present

## 2024-07-04 DIAGNOSIS — H35033 Hypertensive retinopathy, bilateral: Secondary | ICD-10-CM | POA: Diagnosis not present

## 2024-07-04 DIAGNOSIS — H1789 Other corneal scars and opacities: Secondary | ICD-10-CM | POA: Diagnosis not present

## 2024-07-04 DIAGNOSIS — E113293 Type 2 diabetes mellitus with mild nonproliferative diabetic retinopathy without macular edema, bilateral: Secondary | ICD-10-CM | POA: Diagnosis not present

## 2024-07-04 DIAGNOSIS — H524 Presbyopia: Secondary | ICD-10-CM | POA: Diagnosis not present

## 2024-07-04 DIAGNOSIS — H40023 Open angle with borderline findings, high risk, bilateral: Secondary | ICD-10-CM | POA: Diagnosis not present

## 2024-07-04 MED ORDER — LATANOPROST 0.005 % OP SOLN
1.0000 [drp] | Freq: Every day | OPHTHALMIC | 3 refills | Status: AC
  Filled 2024-07-04: qty 7.5, 75d supply, fill #0
  Filled 2024-10-18: qty 7.5, 75d supply, fill #1

## 2024-07-07 ENCOUNTER — Other Ambulatory Visit: Payer: Self-pay

## 2024-07-12 ENCOUNTER — Other Ambulatory Visit (HOSPITAL_COMMUNITY): Payer: Self-pay

## 2024-07-13 ENCOUNTER — Encounter: Payer: Self-pay | Admitting: Family Medicine

## 2024-07-13 ENCOUNTER — Other Ambulatory Visit (HOSPITAL_COMMUNITY): Payer: Self-pay

## 2024-07-16 ENCOUNTER — Encounter (HOSPITAL_COMMUNITY): Payer: Self-pay

## 2024-07-17 ENCOUNTER — Other Ambulatory Visit (HOSPITAL_COMMUNITY): Payer: Self-pay

## 2024-07-24 ENCOUNTER — Other Ambulatory Visit (HOSPITAL_COMMUNITY): Payer: Self-pay

## 2024-07-24 MED ORDER — METFORMIN HCL 1000 MG PO TABS
1000.0000 mg | ORAL_TABLET | Freq: Two times a day (BID) | ORAL | 3 refills | Status: AC
  Filled 2024-07-24 – 2024-09-05 (×2): qty 200, 100d supply, fill #0
  Filled 2024-09-05: qty 180, 90d supply, fill #0
  Filled 2024-11-07: qty 200, 100d supply, fill #0

## 2024-08-02 ENCOUNTER — Other Ambulatory Visit (HOSPITAL_COMMUNITY): Payer: Self-pay

## 2024-08-08 ENCOUNTER — Other Ambulatory Visit (HOSPITAL_COMMUNITY): Payer: Self-pay

## 2024-08-08 ENCOUNTER — Other Ambulatory Visit: Payer: Self-pay

## 2024-08-08 MED ORDER — BETAMETHASONE VALERATE 0.1 % EX CREA
TOPICAL_CREAM | CUTANEOUS | 1 refills | Status: AC
  Filled 2024-08-08: qty 15, 30d supply, fill #0
  Filled 2024-09-05: qty 15, 30d supply, fill #1

## 2024-09-05 ENCOUNTER — Other Ambulatory Visit: Payer: Self-pay

## 2024-09-05 ENCOUNTER — Other Ambulatory Visit (HOSPITAL_COMMUNITY): Payer: Self-pay

## 2024-09-05 ENCOUNTER — Encounter (HOSPITAL_COMMUNITY): Payer: Self-pay

## 2024-09-19 DIAGNOSIS — I10 Essential (primary) hypertension: Secondary | ICD-10-CM | POA: Diagnosis not present

## 2024-09-19 DIAGNOSIS — E1121 Type 2 diabetes mellitus with diabetic nephropathy: Secondary | ICD-10-CM | POA: Diagnosis not present

## 2024-09-19 DIAGNOSIS — Z23 Encounter for immunization: Secondary | ICD-10-CM | POA: Diagnosis not present

## 2024-09-25 ENCOUNTER — Other Ambulatory Visit (HOSPITAL_COMMUNITY): Payer: Self-pay

## 2024-09-25 MED ORDER — MOUNJARO 2.5 MG/0.5ML ~~LOC~~ SOAJ
2.5000 mg | SUBCUTANEOUS | 2 refills | Status: DC
  Filled 2024-09-25: qty 2, 28d supply, fill #0

## 2024-09-25 MED ORDER — BASAGLAR KWIKPEN 100 UNIT/ML ~~LOC~~ SOPN
26.0000 [IU] | PEN_INJECTOR | Freq: Every evening | SUBCUTANEOUS | 1 refills | Status: AC
  Filled 2024-09-25: qty 15, 57d supply, fill #0
  Filled 2024-12-06: qty 15, 57d supply, fill #1

## 2024-09-26 ENCOUNTER — Other Ambulatory Visit (HOSPITAL_COMMUNITY): Payer: Self-pay

## 2024-09-27 ENCOUNTER — Other Ambulatory Visit (HOSPITAL_COMMUNITY): Payer: Self-pay

## 2024-09-28 DIAGNOSIS — Z008 Encounter for other general examination: Secondary | ICD-10-CM | POA: Diagnosis not present

## 2024-09-28 DIAGNOSIS — H409 Unspecified glaucoma: Secondary | ICD-10-CM | POA: Diagnosis not present

## 2024-09-28 DIAGNOSIS — Z6828 Body mass index (BMI) 28.0-28.9, adult: Secondary | ICD-10-CM | POA: Diagnosis not present

## 2024-09-28 DIAGNOSIS — E663 Overweight: Secondary | ICD-10-CM | POA: Diagnosis not present

## 2024-09-29 ENCOUNTER — Other Ambulatory Visit (HOSPITAL_COMMUNITY): Payer: Self-pay

## 2024-09-29 ENCOUNTER — Encounter (INDEPENDENT_AMBULATORY_CARE_PROVIDER_SITE_OTHER): Payer: Self-pay

## 2024-09-29 ENCOUNTER — Other Ambulatory Visit: Payer: Self-pay

## 2024-09-29 MED ORDER — ATORVASTATIN CALCIUM 20 MG PO TABS
20.0000 mg | ORAL_TABLET | Freq: Every day | ORAL | 0 refills | Status: DC
  Filled 2024-09-29: qty 100, 100d supply, fill #0

## 2024-10-02 ENCOUNTER — Encounter (HOSPITAL_COMMUNITY): Payer: Self-pay

## 2024-10-02 ENCOUNTER — Other Ambulatory Visit (HOSPITAL_COMMUNITY): Payer: Self-pay

## 2024-10-13 ENCOUNTER — Other Ambulatory Visit (HOSPITAL_COMMUNITY): Payer: Self-pay

## 2024-10-13 MED ORDER — MOUNJARO 5 MG/0.5ML ~~LOC~~ SOAJ
5.0000 mg | SUBCUTANEOUS | 2 refills | Status: DC
  Filled 2024-10-13: qty 2, 28d supply, fill #0
  Filled 2024-11-07: qty 2, 28d supply, fill #1
  Filled 2024-12-06: qty 2, 28d supply, fill #2

## 2024-10-18 ENCOUNTER — Other Ambulatory Visit: Payer: Self-pay

## 2024-10-18 ENCOUNTER — Ambulatory Visit: Admitting: Neurology

## 2024-10-18 ENCOUNTER — Other Ambulatory Visit (HOSPITAL_COMMUNITY): Payer: Self-pay

## 2024-10-18 MED ORDER — LISINOPRIL 10 MG PO TABS
10.0000 mg | ORAL_TABLET | Freq: Every day | ORAL | 0 refills | Status: AC
  Filled 2024-10-18: qty 90, 90d supply, fill #0

## 2024-10-19 ENCOUNTER — Other Ambulatory Visit (HOSPITAL_COMMUNITY): Payer: Self-pay

## 2024-10-20 ENCOUNTER — Other Ambulatory Visit (HOSPITAL_COMMUNITY): Payer: Self-pay

## 2024-10-20 ENCOUNTER — Other Ambulatory Visit: Payer: Self-pay | Admitting: *Deleted

## 2024-10-20 ENCOUNTER — Other Ambulatory Visit: Payer: Self-pay

## 2024-10-20 MED ORDER — ROPINIROLE HCL 2 MG PO TABS
2.0000 mg | ORAL_TABLET | Freq: Every day | ORAL | 0 refills | Status: DC
  Filled 2024-10-20: qty 180, 90d supply, fill #0

## 2024-10-20 MED ORDER — ROPINIROLE HCL ER 4 MG PO TB24
4.0000 mg | ORAL_TABLET | Freq: Every day | ORAL | 0 refills | Status: DC
  Filled 2024-10-20: qty 90, 90d supply, fill #0

## 2024-10-25 ENCOUNTER — Other Ambulatory Visit: Payer: Self-pay

## 2024-10-25 ENCOUNTER — Other Ambulatory Visit (HOSPITAL_COMMUNITY): Payer: Self-pay

## 2024-10-25 ENCOUNTER — Ambulatory Visit: Admitting: Neurology

## 2024-10-25 ENCOUNTER — Encounter: Payer: Self-pay | Admitting: Neurology

## 2024-10-25 DIAGNOSIS — G35A Relapsing-remitting multiple sclerosis: Secondary | ICD-10-CM | POA: Diagnosis not present

## 2024-10-25 MED ORDER — ROPINIROLE HCL ER 4 MG PO TB24: 4.0000 mg | ORAL_TABLET | Freq: Every day | ORAL | 3 refills | Status: AC

## 2024-10-25 MED ORDER — BETASERON 0.3 MG ~~LOC~~ KIT: 0.3000 mg | PACK | SUBCUTANEOUS | 11 refills | Status: DC

## 2024-10-25 MED ORDER — BETASERON 0.3 MG ~~LOC~~ KIT: 0.3000 mg | PACK | SUBCUTANEOUS | 11 refills | Status: AC

## 2024-10-25 NOTE — Progress Notes (Signed)
 Provider:  Dedra Gores, MD  Primary Care Physician:   Signa Rush, MD (Inactive) [address] [place] [address]     Referring Provider:         Chief Complaint according to patient   Patient presents with:                HISTORY OF PRESENT ILLNESS:  Erica Powell is a 58 y.o. female patient who is here for revisit 10/25/2024 for MS. Needs renewed prescriptions sent  to the [facility] pharmacy.       Chief concern according to patient :  none, is under considerable stress with caretaker duties.     Fam Hx : see previous note- her mother has stage 4 renal failure, has been confused, delirious. She is a caretaker- and mother is deteriorating.  Was taken off Metformin / Protonix tab , started a new medication,  while her glucose improved and her mental status deteriorated. She did not recognize her home as hers and did not recognize her daughters(!)  Social HX; see previous note  , has become a caretaker.   03-30-2022; [place] A. [place] has had confusional spells, has had normal EEGs,  has seen Dr Lajean and had frequent phone conversations with us : she has seen a psychiatrist- who found no evidence of mood disorder.   Zoloft  100 mg has not made a difference and she was willing to continue the medication-   She reports ongoing forgetfulness, forgets bills and her efforts of check book balancing were off.  Music she played for years became difficult to play-  she suddenly finds herself not able to coordinate her hands on the piano- spells of tremors, delayed response to verbal cues, balance is off, some sudden jerking movements,   severe problems with forming thoughts, calculating, spelling.  Biggest thing is her distractibility- she cannot multi task any longer.  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  She is prone to accidents in the pharmacy.    Her white matter changes in her brain are clearly not normal for age.    Abnormal MRI scan of the brain showing bilateral periventricular subcortical and pontine nonspecific white matter hyperintensities which are compatible with but not necessarily specific for multiple sclerosis and may also be seen in small vessel disease or vasculitic conditions.  No enhancing lesions are noted.  Remote age left basal ganglia cystic lesion is noted possibly remote age lacunar infarct.  No significant atrophy is noted.  Overall slight progression of white matter changes compared to previous MRI from January 2022.We looked at a lacunar infarct which has been known from previous MRIs and in comparison to her MRI from January 2022 there has been only a slight progression of white matter disease.  Our concern is that it may be related to diabetes and her A1c she says is very well controlled.  She has remarkably no brain atrophy no migraines, and she is not hypertensive.  Her cervical spine MRI shows the same lesion for many years that was actually a diagnostic for MS.  She has remained on betaseron . The patient's EEG from 6-13 2022 was normal   There was no evidence of vasculitis. There is reportedly , according to psychiatrist, no mood disorder present.       Review of Systems: Out of a complete 14 system review, the patient complains of only the following symptoms, and all other reviewed systems are negative.:  Social History   Socioeconomic History   Marital status: Single    Spouse name: Not on file   Number of children: 0   Years of education: Bachelor's   Highest education level: Not on file  Occupational History   Occupation: Physiological Scientist: [employer]   Tobacco Use   Smoking status: Never   Smokeless tobacco: Never  Substance and Sexual Activity   Alcohol use: No   Drug use: No   Sexual activity: Not on file  Other Topics Concern   Not on file  Social History Narrative   Patient is single with no children   Patient is left handed    Patient has a Energy Manager degree   Patient drinks 20 oz daily   Social Drivers of Corporate Investment Banker Strain: Not on file  Food Insecurity: Not on file  Transportation Needs: Not on file  Physical Activity: Not on file  Stress: Not on file  Social Connections: Not on file    Family History  Problem Relation Age of Onset   Stroke Mother    Lupus Sister    Cancer Maternal Uncle        bone cancer    Past Medical History:  Diagnosis Date   Anemia    Diabetes mellitus    Hyperlipemia    Hypertension    Multiple sclerosis    RLS (restless legs syndrome)    Type 2 diabetes mellitus without (mention of) complications     Past Surgical History:  Procedure Laterality Date   BONE CYST EXCISION     ESSURE TUBAL LIGATION     WISDOM TOOTH EXTRACTION  1985     Current Outpatient Medications on File Prior to Visit  Medication Sig Dispense Refill   atorvastatin  (LIPITOR) 20 MG tablet Take 1 tablet (20 mg total) by mouth daily. 90 tablet 0   atorvastatin  (LIPITOR) 20 MG tablet Take 1 tablet (20 mg total) by mouth daily. 100 tablet 0   atorvastatin  (LIPITOR) 20 MG tablet Take 1 tablet (20 mg total) by mouth daily. 100 tablet 0   betamethasone  valerate (VALISONE ) 0.1 % cream Apply 1 Application (a thin layer) topically to affected area daily. 15 g 1   BETASERON  0.3 MG KIT injection Inject 0.3 mg into the skin every other day. 14 kit 11   cholecalciferol (VITAMIN D) 1000 UNITS tablet Take 1,000 Units by mouth daily.     Continuous Glucose Sensor (FREESTYLE LIBRE 3 PLUS SENSOR) MISC Apply to back of upper arm, change sensor every 15 days. 7 each 3   cycloSPORINE  (RESTASIS ) 0.05 % ophthalmic emulsion Place 1 drop into both eyes 2 (two) times daily. 180 mL 3   ferrous gluconate (FERGON) 324 MG tablet Take 324 mg by mouth daily with breakfast.     fluocinonide  cream (LIDEX ) 0.05 % Apply 1 Application topically to affected area 2 (two) times daily. 30 g 0   hydrOXYzine  (ATARAX ) 10 MG  tablet Take 1 tablet (10 mg total) by mouth 3 (three) times daily as needed. 90 tablet 1   Insulin  Glargine (BASAGLAR  KWIKPEN) 100 UNIT/ML Inject 26 Units into the skin every evening. 15 mL 1   Insulin  Pen Needle (BD PEN NEEDLE NANO 2ND GEN) 32G X 4 MM MISC Use once daily, as directed 100 each 3   latanoprost  (XALATAN ) 0.005 % ophthalmic solution Place 1 drop into both eyes at bedtime. 7.5 mL 2   latanoprost  (XALATAN ) 0.005 % ophthalmic solution  Place 1 drop into both eyes at bedtime. 7.5 mL 2   latanoprost  (XALATAN ) 0.005 % ophthalmic solution Place 1 drop into both eyes at bedtime. 7.5 mL 3   lisinopril  (PRINIVIL ,ZESTRIL ) 10 MG tablet Take 1 tablet (10 mg total) by mouth daily.     lisinopril  (ZESTRIL ) 10 MG tablet Take 1 tablet (10 mg total) by mouth daily. 90 tablet 3   lisinopril  (ZESTRIL ) 10 MG tablet Take 1 tablet (10 mg total) by mouth daily. 90 tablet 0   metFORMIN  (GLUCOPHAGE ) 1000 MG tablet Take 1 tablet (1,000 mg total) by mouth 2 (two) times daily with a meal. 180 tablet 3   metFORMIN  (GLUCOPHAGE ) 1000 MG tablet Take 1 tablet (1,000 mg total) by mouth 2 (two) times daily with a meal. 200 tablet 3   rOPINIRole  (REQUIP  XL) 4 MG 24 hr tablet Take 1 tablet (4 mg total) by mouth at bedtime. 90 tablet 0   rOPINIRole  (REQUIP ) 2 MG tablet Take 1-2 tablets (2-4 mg total) by mouth at bedtime. 180 tablet 0   sertraline  (ZOLOFT ) 100 MG tablet Take 1 tablet (100 mg total) by mouth daily. 90 tablet 1   tirzepatide  (MOUNJARO ) 5 MG/0.5ML Pen Inject 5 mg into the skin once a week for 4 weeks, then reach out to provider for next prescription. 2 mL 2   vitamin B-12 (CYANOCOBALAMIN ) 1000 MCG tablet Take 1,000 mcg by mouth daily.     Insulin  Glargine (BASAGLAR  KWIKPEN) 100 UNIT/ML Inject 16 Units into the skin every evening. 15 mL 2   No current facility-administered medications on file prior to visit.    Allergies  Allergen Reactions   Cephalosporins Itching and Swelling    Occurs with cephalexin  (Keflex). Starts with prickling around the face, swelling/numb feeling of face.   Farxiga  [Dapagliflozin ] Shortness Of Breath    SOB and burning sensation in skin   Hydrochlorothiazide Itching and Swelling    Swelling of the face, hives, itching, feeling of bad sunburn.    Penicillins Itching and Swelling    Occurs with Augmentin and amoxicillin. Hives, prickling, swelling of face.    Sulfa Antibiotics Itching and Swelling    Itching, hives, swelling of face   Baclofen  Other (See Comments)    Leg spasms, jittery, heart racing    Tramadol -Acetaminophen      itchy   Clonazepam      Males me mean   Gabapentin      shaky hands picking at my chest, seeing things like a bug go across the living room floor   Lorazepam  Other (See Comments)    makes me real short-tempered, mean   Iodinated Contrast Media Rash    Reports mild rash and itching with previous MRI w contrast. Usually does well with steroid taper prior to imaging.      DIAGNOSTIC DATA (LABS, IMAGING, TESTING) - I reviewed patient records, labs, notes, testing and imaging myself where available.  Lab Results  Component Value Date   WBC 7.3 04/12/2024   HGB 13.6 04/12/2024   HCT 39.8 04/12/2024   MCV 89 04/12/2024   PLT 260 04/12/2024      Component Value Date/Time   NA 137 04/12/2024 1339   K 4.3 04/12/2024 1339   CL 97 04/12/2024 1339   CO2 20 04/12/2024 1339   GLUCOSE 153 (H) 04/12/2024 1339   BUN 11 04/12/2024 1339   CREATININE 0.77 04/12/2024 1339   CALCIUM  9.8 04/12/2024 1339   PROT 7.5 04/12/2024 1339   ALBUMIN 4.6 04/12/2024 1339  AST 18 04/12/2024 1339   ALT 21 04/12/2024 1339   ALKPHOS 88 04/12/2024 1339   BILITOT 0.4 04/12/2024 1339   GFRNONAA 105 02/06/2019 1132   GFRAA 121 02/06/2019 1132   No results found for: CHOL, HDL, LDLCALC, LDLDIRECT, TRIG, CHOLHDL No results found for: YHAJ8R Lab Results  Component Value Date   VITAMINB12 776 04/12/2024   No results found for:  TSH  PHYSICAL EXAM:  Vitals:   10/25/24 0922  BP: 138/80  Pulse: 72   No data found. Body mass index is 30.79 kg/m.   Wt Readings from Last 3 Encounters:  10/25/24 185 lb (83.9 kg)  04/12/24 186 lb (84.4 kg)  06/30/23 184 lb (83.5 kg)     Ht Readings from Last 3 Encounters:  10/25/24 5' 5 (1.651 m)  04/12/24 5' 5 (1.651 m)  06/30/23 5' 5 (1.651 m)      General: The patient is awake, alert and appears not in acute distress and groomed. Head: Normocephalic, atraumatic.  Neck is supple. Body mass index is 30.37 kg/m.    Ankle edema, pale skin, low turgor.    Generalized: Well developed,  Neurological examination  Mentation: Alert oriented to time, place, history taking.  Follows all commands speech is fluent.   Cranial nerve ,: no change in taste or smell.  Pupils were equal round reactive to light. Extraocular movements were not full - she has left eye adduction deficit , causing diplopia when fatigued.  Facial sensation and strength were normal.  Uvula and tongue midline.  Head turning and shoulder shrug were normal and symmetric. Motor: symmetric motor tone, normal ROM, no rigor.  Sensory: intact to soft touch, vibration and temperature on all 4 extremities.  Gait and station:  stabile unassisted gait and 3 point turns, slight stooped posture- but ataxia with tandem gait and unable to perform toe walking.    Reflexes:  Coordination: No ataxia or dysmetria. Normal and symmetric throughout.  Sensory: Grossly intact throughout to all modalities.    ASSESSMENT AND PLAN :   58 y.o. year old female  here with: long standing MS, relapsing- remitting-  history, RLS, and newer symptoms of  excessive fatigue, sleep attacks, vision changes, lightheadedness and malaise. Now presenting for forgetfulness, cognitive impairment, unable to multitask, calculate or spell as she used to.  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    She has been seen by psychiatrist, has been on Zoloft , increased from 50 to 100 mg and there is according to her report no improvement , psychiatrist reportedly did not feel a mood disorder to be present.  Leg jerking - treated with  requip  2 mg two tabs.  Her ability to function at work has been severely impaired since 2022, her supervisor was concerned about her decline in accuracy of calculation - and the severe problems this could have caused.   She has trouble to multitask.   Brain MRI was showing white matter disease, slow progressive. She has positive oligoclonal bands- MS diagnosis is undisputed.  Remains on Betaseron  , she was thus far not interested in infusion MAB therapy.        1) Refills for Betaseron  needs to be filed with [facility], [place] .   2) all RLS med refilled.  ( 4mg  requip  XR form) 90 day supplies.   3) MRI brain and cervical spine cord -  repeat  next June 2026 .   4) Mounjaro helps glucose levels, lbs 180 pounds, from 236 pounds -had diabetes.    RV in 8 months  with Amy Lomax, NP    I would like to thank Signa Rush, MD (Inactive)  for allowing me to meet with this pleasant patient.   The patient will be seen in follow-up in clinic at [facility] for discussion of test results, sleep related symptoms and treatment compliance review, further management strategies, etc. The referring provider will be notified of the test results.   The patient's condition requires frequent monitoring and adjustments in the treatment plan, reflecting the ongoing complexity of care.  This provider is the continuing focal point for all needed services for this condition.  After spending a total time of  30  minutes face to face and time for  history taking, physical and neurologic examination, review of laboratory studies,  personal review of imaging studies, reports and results of other testing and review of  referral information / records as far as provided in visit,   Electronically signed by: Dedra Gores, MD 10/25/2024 9:40 AM  [facility] Board certified by The [employer] of Sleep Medicine and Diplomate of the [employer] of [employer]. Board certified In Neurology through the ABPN, Fellow of the [employer] of [employer].

## 2024-10-31 ENCOUNTER — Other Ambulatory Visit: Payer: Self-pay | Admitting: *Deleted

## 2024-10-31 ENCOUNTER — Other Ambulatory Visit: Payer: Self-pay

## 2024-10-31 ENCOUNTER — Other Ambulatory Visit (HOSPITAL_COMMUNITY): Payer: Self-pay

## 2024-10-31 MED ORDER — HYDROXYZINE HCL 10 MG PO TABS
10.0000 mg | ORAL_TABLET | Freq: Three times a day (TID) | ORAL | 1 refills | Status: AC | PRN
  Filled 2024-10-31: qty 90, 30d supply, fill #0

## 2024-10-31 NOTE — Telephone Encounter (Signed)
 Last seen on 04/12/24 Follow up scheduled on 05/01/25

## 2024-11-06 ENCOUNTER — Telehealth: Payer: Self-pay | Admitting: Neurology

## 2024-11-06 NOTE — Telephone Encounter (Signed)
 Pt dropped off Rx forms to be filled out and faxed when ready. Put forms in [place] bin in POD 4

## 2024-11-07 ENCOUNTER — Encounter (HOSPITAL_COMMUNITY): Payer: Self-pay

## 2024-11-07 ENCOUNTER — Other Ambulatory Visit: Payer: Self-pay

## 2024-11-07 ENCOUNTER — Other Ambulatory Visit (HOSPITAL_COMMUNITY): Payer: Self-pay

## 2024-11-08 NOTE — Telephone Encounter (Signed)
 Betaseron  PAP completed to Dr. Chalice to sign.

## 2024-11-09 NOTE — Telephone Encounter (Signed)
 Faxed to [employer] [phone]. Confirmation received.

## 2024-12-06 ENCOUNTER — Other Ambulatory Visit (HOSPITAL_COMMUNITY): Payer: Self-pay

## 2024-12-28 ENCOUNTER — Other Ambulatory Visit (HOSPITAL_COMMUNITY): Payer: Self-pay

## 2024-12-28 MED ORDER — LANTUS SOLOSTAR 100 UNIT/ML ~~LOC~~ SOPN
26.0000 [IU] | PEN_INJECTOR | Freq: Every evening | SUBCUTANEOUS | 1 refills | Status: AC
  Filled 2024-12-28: qty 15, 57d supply, fill #0

## 2025-01-07 ENCOUNTER — Other Ambulatory Visit (HOSPITAL_COMMUNITY): Payer: Self-pay

## 2025-01-09 ENCOUNTER — Other Ambulatory Visit (HOSPITAL_COMMUNITY): Payer: Self-pay

## 2025-01-09 MED ORDER — MOUNJARO 5 MG/0.5ML ~~LOC~~ SOAJ
5.0000 mg | SUBCUTANEOUS | 2 refills | Status: AC
  Filled 2025-01-09: qty 2, 28d supply, fill #0

## 2025-01-09 MED ORDER — ATORVASTATIN CALCIUM 20 MG PO TABS
20.0000 mg | ORAL_TABLET | Freq: Every day | ORAL | 0 refills | Status: AC
  Filled 2025-01-09: qty 100, 100d supply, fill #0

## 2025-01-10 ENCOUNTER — Other Ambulatory Visit (HOSPITAL_COMMUNITY): Payer: Self-pay

## 2025-01-11 ENCOUNTER — Other Ambulatory Visit (HOSPITAL_COMMUNITY): Payer: Self-pay

## 2025-01-11 MED ORDER — FREESTYLE LIBRE 3 PLUS SENSOR MISC
3 refills | Status: AC
  Filled 2025-01-11: qty 6, 90d supply, fill #0

## 2025-01-12 ENCOUNTER — Other Ambulatory Visit (HOSPITAL_COMMUNITY): Payer: Self-pay

## 2025-05-01 ENCOUNTER — Ambulatory Visit: Admitting: Neurology
